# Patient Record
Sex: Female | Born: 1985 | Race: Black or African American | Hispanic: No | Marital: Single | Smoking: Current some day smoker
Health system: Southern US, Community
[De-identification: ages and names within clinical notes are randomized; demographics above are authoritative.]

## PROBLEM LIST (undated history)

## (undated) ENCOUNTER — Inpatient Hospital Stay (HOSPITAL_COMMUNITY): Payer: Self-pay

## (undated) DIAGNOSIS — F32A Depression, unspecified: Secondary | ICD-10-CM

## (undated) DIAGNOSIS — A6009 Herpesviral infection of other urogenital tract: Secondary | ICD-10-CM

## (undated) DIAGNOSIS — F329 Major depressive disorder, single episode, unspecified: Secondary | ICD-10-CM

## (undated) DIAGNOSIS — Z8619 Personal history of other infectious and parasitic diseases: Secondary | ICD-10-CM

## (undated) DIAGNOSIS — Q860 Fetal alcohol syndrome (dysmorphic): Secondary | ICD-10-CM

## (undated) DIAGNOSIS — G43909 Migraine, unspecified, not intractable, without status migrainosus: Secondary | ICD-10-CM

## (undated) HISTORY — PX: TYMPANOSTOMY TUBE PLACEMENT: SHX32

## (undated) HISTORY — PX: WISDOM TOOTH EXTRACTION: SHX21

## (undated) HISTORY — PX: APPENDECTOMY: SHX54

## (undated) HISTORY — DX: Depression, unspecified: F32.A

## (undated) HISTORY — PX: EYE SURGERY: SHX253

## (undated) HISTORY — DX: Major depressive disorder, single episode, unspecified: F32.9

## (undated) HISTORY — DX: Fetal alcohol syndrome (dysmorphic): Q86.0

## (undated) HISTORY — DX: Personal history of other infectious and parasitic diseases: Z86.19

---
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
## 2007-04-27 ENCOUNTER — Other Ambulatory Visit: Admission: RE | Admit: 2007-04-27 | Discharge: 2007-04-27 | Payer: Self-pay | Admitting: Family Medicine

## 2007-12-13 ENCOUNTER — Other Ambulatory Visit: Admission: RE | Admit: 2007-12-13 | Discharge: 2007-12-13 | Payer: Self-pay | Admitting: Family Medicine

## 2008-02-26 ENCOUNTER — Emergency Department (HOSPITAL_COMMUNITY): Admission: EM | Admit: 2008-02-26 | Discharge: 2008-02-27 | Payer: Self-pay | Admitting: Emergency Medicine

## 2008-10-26 ENCOUNTER — Inpatient Hospital Stay (HOSPITAL_COMMUNITY): Admission: AD | Admit: 2008-10-26 | Discharge: 2008-10-26 | Payer: Self-pay | Admitting: Obstetrics

## 2008-10-31 ENCOUNTER — Inpatient Hospital Stay (HOSPITAL_COMMUNITY): Admission: AD | Admit: 2008-10-31 | Discharge: 2008-10-31 | Payer: Self-pay | Admitting: Obstetrics

## 2008-10-31 ENCOUNTER — Ambulatory Visit: Payer: Self-pay | Admitting: Family Medicine

## 2008-11-04 ENCOUNTER — Ambulatory Visit (HOSPITAL_COMMUNITY): Admission: RE | Admit: 2008-11-04 | Discharge: 2008-11-04 | Payer: Self-pay | Admitting: Obstetrics

## 2008-12-11 ENCOUNTER — Ambulatory Visit (HOSPITAL_COMMUNITY): Admission: RE | Admit: 2008-12-11 | Discharge: 2008-12-11 | Payer: Self-pay | Admitting: Obstetrics

## 2009-02-05 ENCOUNTER — Ambulatory Visit: Payer: Self-pay | Admitting: Advanced Practice Midwife

## 2009-02-05 ENCOUNTER — Inpatient Hospital Stay (HOSPITAL_COMMUNITY): Admission: AD | Admit: 2009-02-05 | Discharge: 2009-02-06 | Payer: Self-pay | Admitting: Obstetrics

## 2009-02-15 ENCOUNTER — Inpatient Hospital Stay (HOSPITAL_COMMUNITY): Admission: AD | Admit: 2009-02-15 | Discharge: 2009-02-15 | Payer: Self-pay | Admitting: Obstetrics

## 2009-02-17 ENCOUNTER — Inpatient Hospital Stay (HOSPITAL_COMMUNITY): Admission: AD | Admit: 2009-02-17 | Discharge: 2009-02-18 | Payer: Self-pay | Admitting: Obstetrics

## 2009-02-20 ENCOUNTER — Inpatient Hospital Stay (HOSPITAL_COMMUNITY): Admission: AD | Admit: 2009-02-20 | Discharge: 2009-02-22 | Payer: Self-pay | Admitting: Obstetrics

## 2010-04-06 ENCOUNTER — Other Ambulatory Visit: Payer: Self-pay | Admitting: Family Medicine

## 2010-04-06 ENCOUNTER — Other Ambulatory Visit (HOSPITAL_COMMUNITY)
Admission: RE | Admit: 2010-04-06 | Discharge: 2010-04-06 | Disposition: A | Discharge status: Home / Self Care | Payer: Self-pay | Source: Ambulatory Visit | Attending: Family Medicine | Admitting: Family Medicine

## 2010-04-06 DIAGNOSIS — N760 Acute vaginitis: Secondary | ICD-10-CM | POA: Insufficient documentation

## 2010-04-06 DIAGNOSIS — Z01419 Encounter for gynecological examination (general) (routine) without abnormal findings: Secondary | ICD-10-CM | POA: Insufficient documentation

## 2010-04-06 DIAGNOSIS — Z113 Encounter for screening for infections with a predominantly sexual mode of transmission: Secondary | ICD-10-CM | POA: Insufficient documentation

## 2010-04-11 LAB — CBC
HCT: 37.7 % (ref 36.0–46.0)
HCT: 38.6 % (ref 36.0–46.0)
Hemoglobin: 12.5 g/dL (ref 12.0–15.0)
Hemoglobin: 12.8 g/dL (ref 12.0–15.0)
MCHC: 33 g/dL (ref 30.0–36.0)
MCHC: 33.3 g/dL (ref 30.0–36.0)
MCV: 92.3 fL (ref 78.0–100.0)
MCV: 93 fL (ref 78.0–100.0)
Platelets: 239 10*3/uL (ref 150–400)
Platelets: 249 10*3/uL (ref 150–400)
RBC: 4.08 MIL/uL (ref 3.87–5.11)
RBC: 4.15 MIL/uL (ref 3.87–5.11)
RDW: 13.1 % (ref 11.5–15.5)
RDW: 13.4 % (ref 11.5–15.5)
WBC: 12.9 10*3/uL — ABNORMAL HIGH (ref 4.0–10.5)
WBC: 17.7 10*3/uL — ABNORMAL HIGH (ref 4.0–10.5)

## 2010-04-11 LAB — URINALYSIS, ROUTINE W REFLEX MICROSCOPIC
Bilirubin Urine: NEGATIVE
Glucose, UA: NEGATIVE mg/dL
Ketones, ur: 15 mg/dL — AB
Nitrite: NEGATIVE
Protein, ur: NEGATIVE mg/dL
Specific Gravity, Urine: 1.025 (ref 1.005–1.030)
Urobilinogen, UA: 0.2 mg/dL (ref 0.0–1.0)
pH: 5.5 (ref 5.0–8.0)

## 2010-04-11 LAB — URINE MICROSCOPIC-ADD ON: RBC / HPF: NONE SEEN RBC/hpf (ref <3)

## 2010-04-11 LAB — RPR: RPR Ser Ql: NONREACTIVE

## 2010-04-28 LAB — RH IMMUNE GLOBULIN WORKUP (NOT WOMEN'S HOSP)
ABO/RH(D): O NEG
Antibody Screen: NEGATIVE

## 2010-04-29 LAB — COMPREHENSIVE METABOLIC PANEL
ALT: 23 U/L (ref 0–35)
AST: 28 U/L (ref 0–37)
Albumin: 3.1 g/dL — ABNORMAL LOW (ref 3.5–5.2)
Alkaline Phosphatase: 53 U/L (ref 39–117)
BUN: 6 mg/dL (ref 6–23)
CO2: 24 mEq/L (ref 19–32)
Calcium: 9 mg/dL (ref 8.4–10.5)
Chloride: 105 mEq/L (ref 96–112)
Creatinine, Ser: 0.6 mg/dL (ref 0.4–1.2)
GFR calc Af Amer: >60 mL/min (ref >60)
GFR calc non Af Amer: >60 mL/min (ref >60)
Glucose, Bld: 90 mg/dL (ref 70–99)
Potassium: 3.5 mEq/L (ref 3.5–5.1)
Sodium: 135 mEq/L (ref 135–145)
Total Bilirubin: 0.5 mg/dL (ref 0.3–1.2)
Total Protein: 6.4 g/dL (ref 6.0–8.3)

## 2010-04-29 LAB — URINALYSIS, ROUTINE W REFLEX MICROSCOPIC
Bilirubin Urine: NEGATIVE
Bilirubin Urine: NEGATIVE
Glucose, UA: 500 mg/dL — AB
Glucose, UA: NEGATIVE mg/dL
Hgb urine dipstick: NEGATIVE
Ketones, ur: 15 mg/dL — AB
Ketones, ur: NEGATIVE mg/dL
Nitrite: NEGATIVE
Nitrite: NEGATIVE
Protein, ur: 30 mg/dL — AB
Protein, ur: NEGATIVE mg/dL
Specific Gravity, Urine: 1.025 (ref 1.005–1.030)
Specific Gravity, Urine: <1.005 — ABNORMAL LOW (ref 1.005–1.030)
Urobilinogen, UA: 0.2 mg/dL (ref 0.0–1.0)
Urobilinogen, UA: 0.2 mg/dL (ref 0.0–1.0)
pH: 6 (ref 5.0–8.0)
pH: 7 (ref 5.0–8.0)

## 2010-04-29 LAB — URINE MICROSCOPIC-ADD ON

## 2010-04-29 LAB — CBC
HCT: 28.8 % — ABNORMAL LOW (ref 36.0–46.0)
Hemoglobin: 9.8 g/dL — ABNORMAL LOW (ref 12.0–15.0)
MCHC: 34.2 g/dL (ref 30.0–36.0)
MCV: 90.1 fL (ref 78.0–100.0)
Platelets: 281 10*3/uL (ref 150–400)
RBC: 3.19 MIL/uL — ABNORMAL LOW (ref 3.87–5.11)
RDW: 13.3 % (ref 11.5–15.5)
WBC: 13.4 10*3/uL — ABNORMAL HIGH (ref 4.0–10.5)

## 2010-04-29 LAB — URINE CULTURE: Colony Count: 55000

## 2010-05-11 LAB — POCT I-STAT, CHEM 8
BUN: 8 mg/dL (ref 6–23)
Calcium, Ion: 1.16 mmol/L (ref 1.12–1.32)
Chloride: 102 mEq/L (ref 96–112)
Creatinine, Ser: 1 mg/dL (ref 0.4–1.2)
Glucose, Bld: 98 mg/dL (ref 70–99)
HCT: 38 % (ref 36.0–46.0)
Hemoglobin: 12.9 g/dL (ref 12.0–15.0)
Potassium: 3.5 mEq/L (ref 3.5–5.1)
Sodium: 140 mEq/L (ref 135–145)
TCO2: 25 mmol/L (ref 0–100)

## 2010-05-11 LAB — POCT PREGNANCY, URINE: Preg Test, Ur: NEGATIVE

## 2010-06-11 NOTE — Op Note (Signed)
[name] [name]                        ACCOUNT NO.:  [id number]   MEDICAL RECORD NO.:  [id number]                   PATIENT TYPE:  INP   LOCATION:  6119                                 FACILITY:  [facility]   PHYSICIAN:  Prabhakar D. Pendse, M.D.           DATE OF BIRTH:  November 04, 1985   DATE OF PROCEDURE:  03/05/2002  DATE OF DISCHARGE:                                 OPERATIVE REPORT   PREOPERATIVE DIAGNOSIS:  Acute appendicitis.   POSTOPERATIVE DIAGNOSES:  1. Possible periappendicitis.  2. Congestion of the uterus and fallopian tubes without any obvious     infection by gross inspection.   OPERATION:  1. Laparoscopy.  2. Appendectomy.   SURGEON:  Prabhakar D. Levie Heritage, M.D.   ASSISTANT:  Leonia Corona, M.D.   ANESTHESIA:  Nurse.   INDICATIONS:  This 25 year old girl was seen  at [facility]  emergency room on the early morning of March 05, 2002, with a history of  progressively worse abdominal pain, intermittent bilious vomiting, possible  loose stools and low grade fever of about 24 hours duration. Her white count  was 18,100 with a shift to the left. A urinalysis was normal. A CT scan was  done which was nondiagnostic. In view of the localized tenderness in the  lower abdomen and bilious vomiting, the patient was admitted for  observation.   A repeat CBC was done at 12 noon on March 05, 2002, which showed  persistent leukocytosis in the form of 16,500 white count with a shift to  the left and the patient continued to have lower abdominal tenderness.  Hence, a laparoscopic examination and appendectomy was planned.   FINDINGS:  The laparoscopic evaluation of the pelvic cavity showed a small  quantity of free fluid in the pelvic cavity. The appendix was about 4 inches  long with evidence of periappendicitis. No definite obstruction or evidence  of suppurative appendicitis could be seen on gross examination. Examination  of the uterus and tubes showed  generalized congestion of the surfaces  without any gross evidence of purulent drainage. Appendectomy was planned.   DESCRIPTION OF PROCEDURE:  Under satisfactory general endotracheal  anesthesia and the patient in the supine position, the abdomen was  thoroughly prepped and draped in the usual sterile manner. A Foley catheter  was inserted.   The infraumbilical transverse incision was made and  carried through all the  layers of the abdominal wall. After going through the peritoneum, a Hasson 1-  cm port was placed in. The telescope was passed through that and evaluation  of the pelvic cavity was carried out which showed the findings as described  above.   Now a small incision was made in the right midquadrant and a  5-mm port was inserted through that while inspection was carried out from  inside the peritoneal cavity. Likewise another 12-mm port was placed in the  left lower quadrant area.   The  appendix was now grasped and held under traction from the fold on the  right mid quadrant. The dissector was passed through the left lower quadrant  port and the mesentery of the appendix was separated from the lower part of  the appendix and a nice window was made. The dissector was removed and a  vascular stapler was placed in through the 12-mm port and the mesentery was  clamped and cut in the usual fashion.   The appendicular base was now exposed and an appendectomy was done with  another reloaded vascular stapler. A satisfactory appendectomy was  accomplished. There was minimal bleeding which was cleared by irrigation and  aspiration. No further bleeding was noted.   The rest of the pelvic organs appeared unremarkable, and hemostasis being  satisfactory, the appendix was retrieved by bag and all the ports were  serially removed and defects were closed with 0 Vicryl uninterrupted and  interrupted sutures. The skin was approximated with 4-0 Monocryl and 5-0  Monocryl respectively.  Then 0.25% Marcaine with epinephrine was injected  locally for postoperative analgesia.   Throughout the procedure the patient's vital signs remained stable. The  patient withstood the procedure well and was transferred to the recovery  room in satisfactory general condition.                                               Prabhakar D. Levie Heritage, M.D.    PDP/MEDQ  D:  03/05/2002  T:  03/05/2002  Job:  [id number]   cc:   Carren Rang, M.D.  [address]  [place], [place] [address]  Fax: 1   [facility]
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
## 2010-06-25 ENCOUNTER — Inpatient Hospital Stay (HOSPITAL_COMMUNITY)
Admission: AD | Admit: 2010-06-25 | Payer: Medicaid Other | Source: Ambulatory Visit | Admitting: Obstetrics & Gynecology

## 2010-06-26 ENCOUNTER — Emergency Department (HOSPITAL_COMMUNITY)
Admission: EM | Admit: 2010-06-26 | Discharge: 2010-06-26 | Disposition: A | Discharge status: Home / Self Care | Payer: Medicaid Other | Attending: Emergency Medicine | Admitting: Emergency Medicine

## 2010-06-26 DIAGNOSIS — B86 Scabies: Secondary | ICD-10-CM | POA: Insufficient documentation

## 2010-06-26 DIAGNOSIS — R21 Rash and other nonspecific skin eruption: Secondary | ICD-10-CM | POA: Insufficient documentation

## 2010-06-26 DIAGNOSIS — F79 Unspecified intellectual disabilities: Secondary | ICD-10-CM | POA: Insufficient documentation

## 2010-06-26 DIAGNOSIS — K644 Residual hemorrhoidal skin tags: Secondary | ICD-10-CM | POA: Insufficient documentation

## 2010-06-26 DIAGNOSIS — K6289 Other specified diseases of anus and rectum: Secondary | ICD-10-CM | POA: Insufficient documentation

## 2010-10-25 LAB — OB RESULTS CONSOLE HEPATITIS B SURFACE ANTIGEN: Hepatitis B Surface Ag: NEGATIVE

## 2010-10-25 LAB — OB RESULTS CONSOLE GC/CHLAMYDIA: Chlamydia: NEGATIVE

## 2010-10-25 LAB — OB RESULTS CONSOLE RUBELLA ANTIBODY, IGM: Rubella: IMMUNE

## 2010-10-25 LAB — OB RESULTS CONSOLE RPR: RPR: NONREACTIVE

## 2010-10-25 LAB — OB RESULTS CONSOLE HIV ANTIBODY (ROUTINE TESTING): HIV: NONREACTIVE

## 2011-01-25 NOTE — L&D Delivery Note (Signed)
Delivery Note At 7:24 AM a viable female was delivered via Vaginal, Spontaneous Delivery (Presentation: ; Occiput Anterior).  APGAR: 8, 9; weight .   Placenta status: Intact, Spontaneous.  Cord:  with the following complications: None.  Cord pH: not done  Anesthesia:   Episiotomy: None Lacerations:  Suture Repair: 2.0 Est. Blood Loss (mL):   Mom to postpartum.  Baby to nursery-stable.  Dajha Urquilla A 06/02/2011, 7:32 AM

## 2011-02-23 ENCOUNTER — Encounter (HOSPITAL_COMMUNITY): Payer: Self-pay

## 2011-02-23 ENCOUNTER — Inpatient Hospital Stay (HOSPITAL_COMMUNITY)
Admission: AD | Admit: 2011-02-23 | Discharge: 2011-02-23 | Disposition: A | Discharge status: Home / Self Care | Payer: Medicare Other | Source: Ambulatory Visit | Attending: Obstetrics | Admitting: Obstetrics

## 2011-02-23 ENCOUNTER — Inpatient Hospital Stay (HOSPITAL_COMMUNITY): Payer: Medicare Other

## 2011-02-23 DIAGNOSIS — O99891 Other specified diseases and conditions complicating pregnancy: Secondary | ICD-10-CM | POA: Insufficient documentation

## 2011-02-23 DIAGNOSIS — J40 Bronchitis, not specified as acute or chronic: Secondary | ICD-10-CM | POA: Insufficient documentation

## 2011-02-23 LAB — CBC
HCT: 31 % — ABNORMAL LOW (ref 36.0–46.0)
Hemoglobin: 10.4 g/dL — ABNORMAL LOW (ref 12.0–15.0)
MCH: 29.3 pg (ref 26.0–34.0)
MCHC: 33.5 g/dL (ref 30.0–36.0)
MCV: 87.3 fL (ref 78.0–100.0)
Platelets: 280 10*3/uL (ref 150–400)
RBC: 3.55 MIL/uL — ABNORMAL LOW (ref 3.87–5.11)
RDW: 13.2 % (ref 11.5–15.5)
WBC: 8.9 10*3/uL (ref 4.0–10.5)

## 2011-02-23 LAB — URINALYSIS, ROUTINE W REFLEX MICROSCOPIC
Bilirubin Urine: NEGATIVE
Glucose, UA: NEGATIVE mg/dL
Hgb urine dipstick: NEGATIVE
Ketones, ur: 40 mg/dL — AB
Nitrite: NEGATIVE
Protein, ur: 100 mg/dL — AB
Specific Gravity, Urine: 1.025 (ref 1.005–1.030)
Urobilinogen, UA: 2 mg/dL — ABNORMAL HIGH (ref 0.0–1.0)
pH: 7 (ref 5.0–8.0)

## 2011-02-23 LAB — URINE MICROSCOPIC-ADD ON

## 2011-02-23 MED ORDER — AZITHROMYCIN 250 MG PO TABS: ORAL_TABLET | ORAL | Status: AC

## 2011-02-23 MED ORDER — BENZONATATE 100 MG PO CAPS: 100.0000 mg | ORAL_CAPSULE | Freq: Three times a day (TID) | ORAL | Status: AC

## 2011-02-23 NOTE — ED Provider Notes (Signed)
History   Pt presents today c/o a "bad cold." She states she has had sx for about 1wk including cough, congestion, and ear pain. She reports that Dr. Gaynell Face gave her some pills for her ears but she doesn't like the way they make her feel. Yesterday she began to have a slight sore throat. She denies fever or productive cough. She states she has some chest soreness as a result of coughing and she also has noticed some stress urinary incontinence with coughing.  Chief Complaint  Patient presents with  . URI   HPI  OB History    Grav Para Term Preterm Abortions TAB SAB Ect Mult Living   1               No past medical history on file.  No past surgical history on file.  No family history on file.  History  Substance Use Topics  . Smoking status: Not on file  . Smokeless tobacco: Not on file  . Alcohol Use: Not on file    Allergies: Allergies not on file  No prescriptions prior to admission    Review of Systems  Constitutional: Positive for malaise/fatigue. Negative for fever and chills.  HENT: Positive for ear pain, congestion and sore throat. Negative for hearing loss, nosebleeds, tinnitus and ear discharge.   Eyes: Negative for blurred vision and double vision.  Respiratory: Positive for cough. Negative for hemoptysis, sputum production, shortness of breath, wheezing and stridor.   Cardiovascular: Negative for chest pain, palpitations and orthopnea.  Gastrointestinal: Negative for nausea, vomiting, abdominal pain and diarrhea.  Genitourinary: Negative for dysuria, urgency and frequency.  Neurological: Positive for headaches. Negative for dizziness.  Psychiatric/Behavioral: Negative for depression and suicidal ideas.   Physical Exam   Blood pressure 104/63, pulse 100, temperature 98.7 F (37.1 C), temperature source Oral, resp. rate 16, height 5\' 1"  (1.549 m), weight 112 lb 9.6 oz (51.075 kg), SpO2 98.00%.  Physical Exam  Nursing note and vitals  reviewed. Constitutional: She is oriented to person, place, and time. She appears well-developed and well-nourished. No distress.  HENT:  Head: Normocephalic and atraumatic.  Right Ear: No drainage, swelling or tenderness. Tympanic membrane is injected and erythematous. Tympanic membrane is not retracted and not bulging. No decreased hearing is noted.  Left Ear: No drainage, swelling or tenderness. Tympanic membrane is injected and erythematous. Tympanic membrane is not retracted and not bulging. No decreased hearing is noted.  Mouth/Throat: Mucous membranes are normal. No oropharyngeal exudate, posterior oropharyngeal edema, posterior oropharyngeal erythema or tonsillar abscesses.  Eyes: EOM are normal. Pupils are equal, round, and reactive to light.  Cardiovascular: Normal rate, regular rhythm and normal heart sounds.  Exam reveals no gallop.   No murmur heard. Respiratory: Effort normal. No respiratory distress. She has no decreased breath sounds. She has no wheezes. She has rhonchi. She has no rales. She exhibits no tenderness.  GI: Soft. She exhibits no distension. There is no tenderness. There is no rebound and no guarding.  Neurological: She is alert and oriented to person, place, and time.  Skin: Skin is warm and dry. She is not diaphoretic.  Psychiatric: She has a normal mood and affect. Her behavior is normal. Judgment and thought content normal.    MAU Course  Procedures  Results for orders placed during the hospital encounter of 02/23/11 (from the past 24 hour(s))  URINALYSIS, ROUTINE W REFLEX MICROSCOPIC     Status: Abnormal   Collection Time   02/23/11  5:10  PM      Component Value Range   Color, Urine YELLOW  YELLOW    APPearance CLEAR  CLEAR    Specific Gravity, Urine 1.025  1.005 - 1.030    pH 7.0  5.0 - 8.0    Glucose, UA NEGATIVE  NEGATIVE (mg/dL)   Hgb urine dipstick NEGATIVE  NEGATIVE    Bilirubin Urine NEGATIVE  NEGATIVE    Ketones, ur 40 (*) NEGATIVE (mg/dL)    Protein, ur 161 (*) NEGATIVE (mg/dL)   Urobilinogen, UA 2.0 (*) 0.0 - 1.0 (mg/dL)   Nitrite NEGATIVE  NEGATIVE    Leukocytes, UA TRACE (*) NEGATIVE   URINE MICROSCOPIC-ADD ON     Status: Abnormal   Collection Time   02/23/11  5:10 PM      Component Value Range   Squamous Epithelial / LPF MANY (*) RARE    WBC, UA 0-2  <3 (WBC/hpf)   RBC / HPF 3-6  <3 (RBC/hpf)   Bacteria, UA MANY (*) RARE    Urine-Other MUCOUS PRESENT    CBC     Status: Abnormal   Collection Time   02/23/11  5:23 PM      Component Value Range   WBC 8.9  4.0 - 10.5 (K/uL)   RBC 3.55 (*) 3.87 - 5.11 (MIL/uL)   Hemoglobin 10.4 (*) 12.0 - 15.0 (g/dL)   HCT 09.6 (*) 04.5 - 46.0 (%)   MCV 87.3  78.0 - 100.0 (fL)   MCH 29.3  26.0 - 34.0 (pg)   MCHC 33.5  30.0 - 36.0 (g/dL)   RDW 40.9  81.1 - 91.4 (%)   Platelets 280  150 - 400 (K/uL)   Dg Chest 2 View  02/23/2011  *RADIOLOGY REPORT*  Clinical Data: Cough, [redacted] weeks pregnant  CHEST - 2 VIEW  Comparison: 10/31/2003  Findings: Upper normal heart size consistent with pregnancy. Mediastinal contours and pulmonary vascularity normal. Lungs clear. Very minimal chronic peribronchial thickening stable. No pleural effusion or pneumothorax. No acute osseous findings. Tiny bilateral cervical ribs again seen.  IMPRESSION: No acute abnormalities.  Original Report Authenticated By: Lollie Marrow, M.D.     Assessment and Plan  Bronchitis: discussed with pt at length. Will tx with a Z-pack and tessalon Perles. She will f/u with Dr. Gaynell Face. Discussed diet, activity, risks, and precautions.  Clinton Gallant. Cana Mignano III, DrHSc, MPAS, PA-C  02/23/2011, 5:07 PM   Henrietta Hoover, PA 02/23/11 7829

## 2011-02-23 NOTE — Progress Notes (Signed)
Patient states she has had a cold for over a week, has had cough, running nose and bilateral ear pain. Chest hurts with coughing and now having a sore throat. Denies any pregnancy problems. Reports feeling movement, no bleeding but does have a little leaking only with coughing.

## 2011-03-05 ENCOUNTER — Inpatient Hospital Stay (HOSPITAL_COMMUNITY)
Admission: AD | Admit: 2011-03-05 | Discharge: 2011-03-05 | Disposition: A | Discharge status: Home / Self Care | Payer: Medicare Other | Source: Ambulatory Visit | Attending: Obstetrics | Admitting: Obstetrics

## 2011-03-05 ENCOUNTER — Encounter (HOSPITAL_COMMUNITY): Payer: Self-pay | Admitting: *Deleted

## 2011-03-05 DIAGNOSIS — O228X9 Other venous complications in pregnancy, unspecified trimester: Secondary | ICD-10-CM | POA: Insufficient documentation

## 2011-03-05 DIAGNOSIS — K649 Unspecified hemorrhoids: Secondary | ICD-10-CM | POA: Insufficient documentation

## 2011-03-05 MED ORDER — DOCUSATE SODIUM 100 MG PO CAPS: 100.0000 mg | ORAL_CAPSULE | Freq: Two times a day (BID) | ORAL | Status: AC

## 2011-03-05 MED ORDER — HYDROCORTISONE 2.5 % RE CREA: TOPICAL_CREAM | Freq: Two times a day (BID) | RECTAL | Status: AC

## 2011-03-05 NOTE — Progress Notes (Signed)
Patient c/o swelling at rectum since Sunday 27 weeks

## 2011-03-05 NOTE — Discharge Instructions (Signed)

## 2011-03-05 NOTE — ED Provider Notes (Signed)
History   Pt presents today c/o possible hemorrhoid. She states for the past 3 days she has had a lump and itching in her anus and pain with BM. She denies bleeding, abd pain, vag dc, vag bleeding, or any other sx at this time.  Chief Complaint  Patient presents with  . Hemorrhoids   HPI  OB History    Grav Para Term Preterm Abortions TAB SAB Ect Mult Living   2 1 1  0 0 0 0 0 0 1      History reviewed. No pertinent past medical history.  Past Surgical History  Procedure Date  . Appendectomy     No family history on file.  History  Substance Use Topics  . Smoking status: Never Smoker   . Smokeless tobacco: Never Used  . Alcohol Use: No    Allergies:  Allergies  Allergen Reactions  . Ibuprofen Swelling    Prescriptions prior to admission  Medication Sig Dispense Refill  . azithromycin (ZITHROMAX) 250 MG tablet Take 250 mg by mouth daily.      Marland Kitchen DISCONTD: Phenylephrine-DM-GG (TUSSIN CF PO) Take 5 mLs by mouth daily as needed. Patient used this medication for a cold.        Review of Systems  Constitutional: Negative for fever and chills.  Eyes: Negative for blurred vision.  Respiratory: Negative for cough, hemoptysis, sputum production, shortness of breath and wheezing.   Cardiovascular: Negative for chest pain and palpitations.  Gastrointestinal: Negative for nausea, vomiting, abdominal pain and diarrhea.  Genitourinary: Negative for dysuria, urgency, frequency and hematuria.  Neurological: Negative for dizziness and headaches.  Psychiatric/Behavioral: Negative for depression and suicidal ideas.   Physical Exam   Blood pressure 104/60, pulse 102, temperature 97.5 F (36.4 C), temperature source Oral, resp. rate 16, height 5' 0.5" (1.537 m), weight 117 lb (53.071 kg).  Physical Exam  Nursing note and vitals reviewed. Constitutional: She is oriented to person, place, and time. She appears well-developed and well-nourished. No distress.  HENT:  Head:  Normocephalic and atraumatic.  Eyes: EOM are normal. Pupils are equal, round, and reactive to light.  GI: Soft. She exhibits no distension and no mass. There is no tenderness. There is no rebound and no guarding.  Genitourinary: Rectal exam shows external hemorrhoid (Very small external hemorrhoid noted on exam.). No vaginal discharge found.  Neurological: She is alert and oriented to person, place, and time.  Skin: Skin is warm and dry. She is not diaphoretic.  Psychiatric: She has a normal mood and affect. Her behavior is normal. Judgment and thought content normal.    MAU Course  Procedures  NST reactive for 26wks.  Assessment and Plan  Hemorrhoid: discussed with pt at length. Will give Rx for Anusol HC cream and colace. She has f/u scheduled with Dr. Gaynell Face. Discussed diet, activity, risks, and precautions.  Clinton Gallant. Jilda Kress III, DrHSc, MPAS, PA-C  03/05/2011, 10:44 AM   Henrietta Hoover, PA 03/05/11 1048

## 2011-03-28 ENCOUNTER — Other Ambulatory Visit: Payer: Self-pay | Admitting: Obstetrics

## 2011-03-28 ENCOUNTER — Inpatient Hospital Stay (HOSPITAL_COMMUNITY)
Admission: AD | Admit: 2011-03-28 | Discharge: 2011-03-28 | Disposition: A | Discharge status: Home / Self Care | Payer: Medicare Other | Source: Ambulatory Visit | Attending: Obstetrics | Admitting: Obstetrics

## 2011-03-28 DIAGNOSIS — Z3480 Encounter for supervision of other normal pregnancy, unspecified trimester: Secondary | ICD-10-CM | POA: Insufficient documentation

## 2011-03-28 DIAGNOSIS — Z2989 Encounter for other specified prophylactic measures: Secondary | ICD-10-CM | POA: Insufficient documentation

## 2011-03-28 DIAGNOSIS — Z298 Encounter for other specified prophylactic measures: Secondary | ICD-10-CM | POA: Insufficient documentation

## 2011-03-28 MED ORDER — RHO D IMMUNE GLOBULIN 1500 UNIT/2ML IJ SOLN
300.0000 ug | Freq: Once | INTRAMUSCULAR | Status: AC
  Administered 2011-03-28: 300 ug via INTRAMUSCULAR

## 2011-03-29 LAB — RH IG WORKUP (INCLUDES ABO/RH)
Antibody Screen: NEGATIVE
Gestational Age(Wks): 30

## 2011-04-01 MED ORDER — RHO D IMMUNE GLOBULIN 1500 UNIT/2ML IJ SOLN: 300.0000 ug | Freq: Once | INTRAMUSCULAR | Status: AC

## 2011-05-29 ENCOUNTER — Encounter (HOSPITAL_COMMUNITY): Payer: Self-pay | Admitting: *Deleted

## 2011-05-29 ENCOUNTER — Inpatient Hospital Stay (HOSPITAL_COMMUNITY)
Admission: AD | Admit: 2011-05-29 | Discharge: 2011-05-29 | Disposition: A | Discharge status: Home / Self Care | Payer: Medicare Other | Source: Ambulatory Visit | Attending: Obstetrics | Admitting: Obstetrics

## 2011-05-29 DIAGNOSIS — O479 False labor, unspecified: Secondary | ICD-10-CM | POA: Insufficient documentation

## 2011-05-29 NOTE — MAU Note (Signed)
Pt present to mau for labor eval.

## 2011-05-29 NOTE — Discharge Instructions (Signed)
Return to MAU with worsening symptoms.  Call your doctor with any concerns or questions.   

## 2011-05-29 NOTE — Progress Notes (Signed)
Dr. Gaynell Face notified of pt presenting for labor check.  Notified of VE and ctx pattern.  Orders received to discharge pt home.

## 2011-05-31 ENCOUNTER — Encounter (HOSPITAL_COMMUNITY): Payer: Self-pay | Admitting: *Deleted

## 2011-05-31 ENCOUNTER — Telehealth (HOSPITAL_COMMUNITY): Payer: Self-pay | Admitting: *Deleted

## 2011-05-31 NOTE — Telephone Encounter (Signed)
Preadmission screen  

## 2011-06-02 ENCOUNTER — Inpatient Hospital Stay (HOSPITAL_COMMUNITY)
Admission: AD | Admit: 2011-06-02 | Discharge: 2011-06-04 | DRG: 775 | Disposition: A | Discharge status: Home / Self Care | Payer: Medicare Other | Source: Ambulatory Visit | Attending: Obstetrics | Admitting: Obstetrics

## 2011-06-02 ENCOUNTER — Encounter (HOSPITAL_COMMUNITY): Payer: Self-pay | Admitting: *Deleted

## 2011-06-02 LAB — CBC
HCT: 32.9 % — ABNORMAL LOW (ref 36.0–46.0)
Hemoglobin: 10.7 g/dL — ABNORMAL LOW (ref 12.0–15.0)
MCHC: 32.5 g/dL (ref 30.0–36.0)
RBC: 3.79 MIL/uL — ABNORMAL LOW (ref 3.87–5.11)
WBC: 10.3 10*3/uL (ref 4.0–10.5)

## 2011-06-02 LAB — RPR: RPR Ser Ql: NONREACTIVE

## 2011-06-02 MED ORDER — WITCH HAZEL-GLYCERIN EX PADS: 1.0000 "application " | MEDICATED_PAD | CUTANEOUS | Status: DC | PRN

## 2011-06-02 MED ORDER — PENICILLIN G POTASSIUM 5000000 UNITS IJ SOLR
2.5000 10*6.[IU] | INTRAVENOUS | Status: DC
  Administered 2011-06-02: 2.5 10*6.[IU] via INTRAVENOUS
  Filled 2011-06-02 (×3): qty 2.5

## 2011-06-02 MED ORDER — OXYCODONE-ACETAMINOPHEN 5-325 MG PO TABS: 1.0000 | ORAL_TABLET | ORAL | Status: DC | PRN

## 2011-06-02 MED ORDER — LANOLIN HYDROUS EX OINT: TOPICAL_OINTMENT | CUTANEOUS | Status: DC | PRN

## 2011-06-02 MED ORDER — SIMETHICONE 80 MG PO CHEW: 80.0000 mg | CHEWABLE_TABLET | ORAL | Status: DC | PRN

## 2011-06-02 MED ORDER — ONDANSETRON HCL 4 MG PO TABS: 4.0000 mg | ORAL_TABLET | ORAL | Status: DC | PRN

## 2011-06-02 MED ORDER — FLEET ENEMA 7-19 GM/118ML RE ENEM: 1.0000 | ENEMA | RECTAL | Status: DC | PRN

## 2011-06-02 MED ORDER — BUTORPHANOL TARTRATE 2 MG/ML IJ SOLN
1.0000 mg | INTRAMUSCULAR | Status: DC | PRN
  Administered 2011-06-02 (×2): 1 mg via INTRAVENOUS
  Filled 2011-06-02 (×2): qty 1

## 2011-06-02 MED ORDER — TETANUS-DIPHTH-ACELL PERTUSSIS 5-2.5-18.5 LF-MCG/0.5 IM SUSP
0.5000 mL | Freq: Once | INTRAMUSCULAR | Status: AC
  Administered 2011-06-03: 0.5 mL via INTRAMUSCULAR
  Filled 2011-06-02: qty 0.5

## 2011-06-02 MED ORDER — SENNOSIDES-DOCUSATE SODIUM 8.6-50 MG PO TABS
2.0000 | ORAL_TABLET | Freq: Every day | ORAL | Status: DC
  Administered 2011-06-02 – 2011-06-03 (×2): 2 via ORAL

## 2011-06-02 MED ORDER — ONDANSETRON HCL 4 MG/2ML IJ SOLN: 4.0000 mg | INTRAMUSCULAR | Status: DC | PRN

## 2011-06-02 MED ORDER — ONDANSETRON HCL 4 MG/2ML IJ SOLN
4.0000 mg | Freq: Four times a day (QID) | INTRAMUSCULAR | Status: DC | PRN
  Administered 2011-06-02: 4 mg via INTRAVENOUS
  Filled 2011-06-02: qty 2

## 2011-06-02 MED ORDER — DIPHENHYDRAMINE HCL 25 MG PO CAPS: 25.0000 mg | ORAL_CAPSULE | Freq: Four times a day (QID) | ORAL | Status: DC | PRN

## 2011-06-02 MED ORDER — ACETAMINOPHEN 325 MG PO TABS
650.0000 mg | ORAL_TABLET | ORAL | Status: DC | PRN
  Administered 2011-06-03: 650 mg via ORAL
  Filled 2011-06-02: qty 2

## 2011-06-02 MED ORDER — LACTATED RINGERS IV SOLN
INTRAVENOUS | Status: DC
  Administered 2011-06-02: 03:00:00 via INTRAVENOUS

## 2011-06-02 MED ORDER — PRENATAL MULTIVITAMIN CH
1.0000 | ORAL_TABLET | Freq: Every day | ORAL | Status: DC
  Administered 2011-06-02 – 2011-06-04 (×3): 1 via ORAL
  Filled 2011-06-02 (×3): qty 1

## 2011-06-02 MED ORDER — OXYCODONE-ACETAMINOPHEN 5-325 MG PO TABS
1.0000 | ORAL_TABLET | ORAL | Status: DC | PRN
Start: 2011-06-02 — End: 2011-06-04
  Administered 2011-06-02 – 2011-06-03 (×5): 1 via ORAL
  Administered 2011-06-04: 2 via ORAL
  Filled 2011-06-02 (×7): qty 1

## 2011-06-02 MED ORDER — LACTATED RINGERS IV SOLN: 500.0000 mL | INTRAVENOUS | Status: DC | PRN

## 2011-06-02 MED ORDER — CITRIC ACID-SODIUM CITRATE 334-500 MG/5ML PO SOLN: 30.0000 mL | ORAL | Status: DC | PRN

## 2011-06-02 MED ORDER — ZOLPIDEM TARTRATE 5 MG PO TABS: 5.0000 mg | ORAL_TABLET | Freq: Every evening | ORAL | Status: DC | PRN

## 2011-06-02 MED ORDER — LIDOCAINE HCL (PF) 1 % IJ SOLN
30.0000 mL | INTRAMUSCULAR | Status: DC | PRN
  Filled 2011-06-02: qty 30

## 2011-06-02 MED ORDER — FERROUS SULFATE 325 (65 FE) MG PO TABS
325.0000 mg | ORAL_TABLET | Freq: Two times a day (BID) | ORAL | Status: DC
  Administered 2011-06-02 – 2011-06-03 (×2): 325 mg via ORAL
  Filled 2011-06-02 (×4): qty 1

## 2011-06-02 MED ORDER — IBUPROFEN 600 MG PO TABS: 600.0000 mg | ORAL_TABLET | Freq: Four times a day (QID) | ORAL | Status: DC

## 2011-06-02 MED ORDER — DEXTROSE 5 % IV SOLN
5.0000 10*6.[IU] | Freq: Once | INTRAVENOUS | Status: AC
  Administered 2011-06-02: 5 10*6.[IU] via INTRAVENOUS
  Filled 2011-06-02: qty 5

## 2011-06-02 MED ORDER — BENZOCAINE-MENTHOL 20-0.5 % EX AERO
1.0000 "application " | INHALATION_SPRAY | CUTANEOUS | Status: DC | PRN
  Filled 2011-06-02: qty 56

## 2011-06-02 MED ORDER — NALOXONE HCL 0.4 MG/ML IJ SOLN
INTRAMUSCULAR | Status: AC
  Filled 2011-06-02: qty 1

## 2011-06-02 MED ORDER — OXYTOCIN 20 UNITS IN LACTATED RINGERS INFUSION - SIMPLE: 125.0000 mL/h | Freq: Once | INTRAVENOUS | Status: DC

## 2011-06-02 MED ORDER — DIBUCAINE 1 % RE OINT
1.0000 "application " | TOPICAL_OINTMENT | RECTAL | Status: DC | PRN
  Filled 2011-06-02: qty 28

## 2011-06-02 MED ORDER — OXYTOCIN BOLUS FROM INFUSION
500.0000 mL | Freq: Once | INTRAVENOUS | Status: DC
  Filled 2011-06-02: qty 1000
  Filled 2011-06-02: qty 500

## 2011-06-02 NOTE — H&P (Signed)
This is Dr. Francoise Ceo dictating the history and physical on  [name] [name] she's a 26 year old gravida 2 para 1001 at 75 weeks and some days her EDC is 06/05/2011 she was admitted with ruptured membranes occurred at 4:30 AM today fluid clear contracting every 2 minutes and on examination the cervix is 2 cm 90% with the vertex at 0 station she was having some bearable D cells and an IUPC was inserted and amnioinfusion began her GBS was negative Past medical history patient had fetal alcohol syndrome Past surgical history negative Family history negative System review noncontributory Physical exam well-developed female in no distress HEENT negative Breasts negative Lungs clear to P&A Heart regular rhythm no murmurs no gallops Abdomen term Pelvic as described above Extremities negative and

## 2011-06-02 NOTE — MAU Note (Signed)
Pt reports contractions and leaking fluid since 2300. 

## 2011-06-02 NOTE — Progress Notes (Signed)
Provider aware of fhr tracing, pt requesting pain medication, Dr. Gaynell Face said to give her stadol

## 2011-06-03 LAB — CBC
Hemoglobin: 10.5 g/dL — ABNORMAL LOW (ref 12.0–15.0)
MCH: 28.1 pg (ref 26.0–34.0)
MCHC: 32.2 g/dL (ref 30.0–36.0)
Platelets: 270 10*3/uL (ref 150–400)
RDW: 13.6 % (ref 11.5–15.5)

## 2011-06-03 MED ORDER — RHO D IMMUNE GLOBULIN 1500 UNIT/2ML IJ SOLN
300.0000 ug | Freq: Once | INTRAMUSCULAR | Status: AC
  Administered 2011-06-03: 300 ug via INTRAMUSCULAR
  Filled 2011-06-03: qty 2

## 2011-06-03 NOTE — Progress Notes (Signed)
UR chart review completed.  

## 2011-06-03 NOTE — Progress Notes (Signed)
Patient ID: [name] [name], female   DOB: 06-10-1985, 26 y.o.   MRN: [id number] Postpartum day one Vital signs normal Fundus firm Lochia moderate Legs negative No complaints
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
## 2011-06-04 LAB — RH IG WORKUP (INCLUDES ABO/RH)
ABO/RH(D): O NEG
Gestational Age(Wks): 39

## 2011-06-04 MED ORDER — NORETHINDRONE 0.35 MG PO TABS: 1.0000 | ORAL_TABLET | Freq: Every day | ORAL | Status: DC

## 2011-06-04 MED ORDER — OXYCODONE-ACETAMINOPHEN 5-325 MG PO TABS: 1.0000 | ORAL_TABLET | Freq: Four times a day (QID) | ORAL | Status: AC | PRN

## 2011-06-04 MED ORDER — PRENATAL MULTIVITAMIN CH: 1.0000 | ORAL_TABLET | Freq: Every day | ORAL | Status: DC

## 2011-06-04 NOTE — Discharge Instructions (Signed)

## 2011-06-04 NOTE — Discharge Summary (Signed)
Obstetric Discharge Summary Reason for Admission: onset of labor Prenatal Procedures: none Intrapartum Procedures: spontaneous vaginal delivery Postpartum Procedures: none Complications-Operative and Postpartum: none Hemoglobin  Date Value Range Status  06/03/2011 10.5* 12.0-15.0 (g/dL) Final     HCT  Date Value Range Status  06/03/2011 32.6* 36.0-46.0 (%) Final    Physical Exam:  General: alert Lochia: appropriate Uterine Fundus: firm Incision: n/[name] DVT Evaluation: No evidence of DVT seen on physical exam.  Discharge Diagnoses: Term Pregnancy-delivered  Discharge Information: Date: 06/04/2011 Activity: pelvic rest Diet: routine Medications: PNV, Ibuprofen, Percocet and Micronor Condition: stable Instructions: see above Discharge to: home Follow-up Information    Follow up with EmilyBERNARD A, MD. Schedule an appointment as soon as possible for [name] visit in 6 weeks. (Call to schedule cirmcumcision)    Contact information:   [address] [place] [address] [phone]          Newborn Data: Live born female  Birth Weight: 6 lb 1.9 oz (2775 g) APGAR: 8, 9  Home with mother.  [name],[name] Krisko [name] 06/04/2011, 9:56 AM

## 2012-06-19 LAB — OB RESULTS CONSOLE GBS: GBS: NEGATIVE

## 2012-08-30 ENCOUNTER — Inpatient Hospital Stay (HOSPITAL_COMMUNITY)
Admission: AD | Admit: 2012-08-30 | Discharge: 2012-08-30 | Disposition: A | Discharge status: Home / Self Care | Payer: Medicare Other | Source: Ambulatory Visit | Attending: Obstetrics | Admitting: Obstetrics

## 2012-08-30 ENCOUNTER — Encounter (HOSPITAL_COMMUNITY): Payer: Self-pay

## 2012-08-30 ENCOUNTER — Inpatient Hospital Stay (HOSPITAL_COMMUNITY): Payer: Medicare Other

## 2012-08-30 DIAGNOSIS — O479 False labor, unspecified: Secondary | ICD-10-CM | POA: Insufficient documentation

## 2012-08-30 DIAGNOSIS — O4103X1 Oligohydramnios, third trimester, fetus 1: Secondary | ICD-10-CM

## 2012-08-30 DIAGNOSIS — O368131 Decreased fetal movements, third trimester, fetus 1: Secondary | ICD-10-CM

## 2012-08-30 NOTE — MAU Note (Signed)
Contractions since 3 am this morning with bloody show. Denies leaking of fluid. Positive fetal movement. Was dilated 1 cm in office on Tuesday.

## 2012-08-31 ENCOUNTER — Inpatient Hospital Stay (HOSPITAL_COMMUNITY)
Admission: AD | Admit: 2012-08-31 | Discharge: 2012-09-01 | DRG: 775 | Disposition: A | Discharge status: Home / Self Care | Payer: Medicare Other | Source: Ambulatory Visit | Attending: Obstetrics | Admitting: Obstetrics

## 2012-08-31 ENCOUNTER — Encounter (HOSPITAL_COMMUNITY): Payer: Self-pay | Admitting: Obstetrics and Gynecology

## 2012-08-31 DIAGNOSIS — O479 False labor, unspecified: Secondary | ICD-10-CM

## 2012-08-31 LAB — RPR: RPR Ser Ql: NONREACTIVE

## 2012-08-31 LAB — CBC
HCT: 30.6 % — ABNORMAL LOW (ref 36.0–46.0)
Hemoglobin: 10.3 g/dL — ABNORMAL LOW (ref 12.0–15.0)
MCH: 29.3 pg (ref 26.0–34.0)
MCHC: 33.7 g/dL (ref 30.0–36.0)
MCV: 86.9 fL (ref 78.0–100.0)
RDW: 13.7 % (ref 11.5–15.5)

## 2012-08-31 MED ORDER — FERROUS SULFATE 325 (65 FE) MG PO TABS
325.0000 mg | ORAL_TABLET | Freq: Two times a day (BID) | ORAL | Status: DC
  Administered 2012-08-31 – 2012-09-01 (×2): 325 mg via ORAL
  Filled 2012-08-31 (×2): qty 1

## 2012-08-31 MED ORDER — BENZOCAINE-MENTHOL 20-0.5 % EX AERO: 1.0000 "application " | INHALATION_SPRAY | CUTANEOUS | Status: DC | PRN

## 2012-08-31 MED ORDER — ERYTHROMYCIN 5 MG/GM OP OINT
TOPICAL_OINTMENT | Freq: Once | OPHTHALMIC | Status: AC
  Administered 2012-08-31: 1 via OPHTHALMIC

## 2012-08-31 MED ORDER — DIPHENHYDRAMINE HCL 25 MG PO CAPS: 25.0000 mg | ORAL_CAPSULE | Freq: Four times a day (QID) | ORAL | Status: DC | PRN

## 2012-08-31 MED ORDER — PRENATAL MULTIVITAMIN CH
1.0000 | ORAL_TABLET | Freq: Every day | ORAL | Status: DC
  Administered 2012-09-01: 1 via ORAL
  Filled 2012-08-31: qty 1

## 2012-08-31 MED ORDER — ONDANSETRON HCL 4 MG/2ML IJ SOLN: 4.0000 mg | INTRAMUSCULAR | Status: DC | PRN

## 2012-08-31 MED ORDER — ONDANSETRON HCL 4 MG PO TABS
4.0000 mg | ORAL_TABLET | ORAL | Status: DC | PRN
  Administered 2012-08-31: 4 mg via ORAL
  Filled 2012-08-31: qty 1

## 2012-08-31 MED ORDER — WITCH HAZEL-GLYCERIN EX PADS: 1.0000 "application " | MEDICATED_PAD | CUTANEOUS | Status: DC | PRN

## 2012-08-31 MED ORDER — LANOLIN HYDROUS EX OINT: 1.0000 "application " | TOPICAL_OINTMENT | CUTANEOUS | Status: DC | PRN

## 2012-08-31 MED ORDER — SENNOSIDES-DOCUSATE SODIUM 8.6-50 MG PO TABS
2.0000 | ORAL_TABLET | Freq: Every day | ORAL | Status: DC
  Administered 2012-08-31: 2 via ORAL

## 2012-08-31 MED ORDER — MEASLES, MUMPS & RUBELLA VAC ~~LOC~~ INJ
0.5000 mL | INJECTION | Freq: Once | SUBCUTANEOUS | Status: DC
  Filled 2012-08-31: qty 0.5

## 2012-08-31 MED ORDER — DIBUCAINE 1 % RE OINT: 1.0000 "application " | TOPICAL_OINTMENT | RECTAL | Status: DC | PRN

## 2012-08-31 MED ORDER — ZOLPIDEM TARTRATE 5 MG PO TABS: 5.0000 mg | ORAL_TABLET | Freq: Every evening | ORAL | Status: DC | PRN

## 2012-08-31 MED ORDER — MAGNESIUM HYDROXIDE 400 MG/5ML PO SUSP: 30.0000 mL | ORAL | Status: DC | PRN

## 2012-08-31 MED ORDER — SIMETHICONE 80 MG PO CHEW: 80.0000 mg | CHEWABLE_TABLET | ORAL | Status: DC | PRN

## 2012-08-31 MED ORDER — OXYCODONE-ACETAMINOPHEN 5-325 MG PO TABS
1.0000 | ORAL_TABLET | ORAL | Status: DC | PRN
  Administered 2012-08-31 (×2): 2 via ORAL
  Administered 2012-08-31: 1 via ORAL
  Administered 2012-09-01: 2 via ORAL
  Filled 2012-08-31 (×2): qty 2
  Filled 2012-08-31 (×2): qty 1

## 2012-08-31 MED ORDER — ONDANSETRON 8 MG PO TBDP
8.0000 mg | ORAL_TABLET | Freq: Once | ORAL | Status: AC
  Administered 2012-08-31: 8 mg via ORAL
  Filled 2012-08-31: qty 1

## 2012-08-31 MED ORDER — ERYTHROMYCIN 5 MG/GM OP OINT
TOPICAL_OINTMENT | OPHTHALMIC | Status: AC
  Administered 2012-08-31: 1 via OPHTHALMIC
  Filled 2012-08-31: qty 1

## 2012-08-31 MED ORDER — TETANUS-DIPHTH-ACELL PERTUSSIS 5-2.5-18.5 LF-MCG/0.5 IM SUSP
0.5000 mL | Freq: Once | INTRAMUSCULAR | Status: AC
  Administered 2012-09-01: 0.5 mL via INTRAMUSCULAR
  Filled 2012-08-31: qty 0.5

## 2012-08-31 NOTE — Lactation Note (Signed)
This note was copied from the chart of [name] Amariz Flamenco. Lactation Consultation Note Initial consultation with this experienced mother; baby was delivered at home early this morning and transported via EMS to [facility] with no distress.  Mom states baby latches well and had a few good feeds this morning, but has been asleep since. Baby now getting her bath, awake and alert, calm. Assisted mom to place baby STS and attempted to latch baby. However, baby was alert and looking around the room, uninterested in feeding at this time. Enc mom to continue frequent STS and cue based feeding, to attempt again in 30 min to one hour, or sooner if baby shows cues.  Reviewed br feeding basics, Baby and Me book, answered questions.  Enc mom to call lactation office if she has any concerns, and to attend the BFSG.  Patient Name: [name] [name] [id number] Date: 08/31/2012 Reason for consult: Initial assessment   Maternal Data Formula Feeding for Exclusion: No Infant to breast within first hour of birth:  (baby born at home) Has patient been taught Hand Expression?: Yes Does the patient have breastfeeding experience prior to this delivery?: Yes  Feeding Feeding Type: Breast Milk  LATCH Score/Interventions                      Lactation Tools Discussed/Used     Consult Status Consult Status: PRN Follow-up type: In-patient    Octavio Manns [facility] 08/31/2012, 3:41 PM

## 2012-08-31 NOTE — Clinical Social Work Maternal (Signed)
Clinical Social Work Department PSYCHOSOCIAL ASSESSMENT - MATERNAL/CHILD 08/31/2012  Patient:  [name] [name], WALPOLE  Account Number:  [id number]  Admit Date:  08/31/2012  Marjo Bicker Name:   Suzette Battiest    Clinical Social Worker:  Nobie Putnam, LCSW   Date/Time:  08/31/2012 01:21 PM  Date Referred:  08/31/2012   Referral source  CN  CN     Referred reason  Other - See comment   Other referral source:    I:  FAMILY / HOME ENVIRONMENT Child's legal guardian:  PARENT  Guardian - Name Guardian - Age Guardian - Address  Maycie Luera [address] [place], [place] [address]  Earney Navy 36 (same as above)   Other household support members/support persons Name Relationship DOB  Volney Presser. [place] 2013  Georgeanna Lea 2011   Other support:    II  PSYCHOSOCIAL DATA Information Source:  Patient Interview  Event organiser Employment:   Financial resources:  [employer] If Medicaid - County:  [place] Other  Food Stamps  WIC   School / Grade:   Maternity Care Coordinator / Child Services Coordination / Early Interventions:  Cultural issues impacting care:    III  STRENGTHS Strengths  Adequate Resources  Home prepared for Child (including basic supplies)  Supportive family/friends   Strength comment:    IV  RISK FACTORS AND CURRENT PROBLEMS Current Problem:  YES   Risk Factor & Current Problem Patient Issue Family Issue Risk Factor / Current Problem Comment  Mental Illness Y N Hx of PP depression    V  SOCIAL WORK ASSESSMENT CSW met with pt to assess her current social situation & offer resources as needed.  Pt lives with FOB & their children.  The pt has fetal alcohol syndrome versus one of her children, as reported to CSW.  The pt is high functioning & was able to answer all this CSW questions appropriately, when simplified.  CSW met with the family last year after pt delivered to assess PP depression history.  Pt & FOB were very friendly &  pleasant.  They described their experience of delivering at home & are happy that the baby is okay.  Since pt experienced PP depression after her daughter in 2011, CSW discussed signs & symptoms with the couple.  The pt did not experience PP depression after her son last year but admits to some on going depressed moods.  She identified her mother as the primary stressor, as she described her as a "drama queen." Pt's mother continues to drink alcohol & wants everything "her way," per pt.  Pt does not like conflict so she tries to avoid engaging any verbal altercations with her.  FOB will speak up to pt's mother but denies any physical contact.  Pt expressed interest in speaking with a therapist.  FOB thinks pt would benefits from services.  Pt is not interested in medication management.  She denies any SI.  CSW will make a referral to a counselor & provide pt with information.  The couple has all the necessary supplies for the infant.  Both parents appear to be appropriate at this time.  CSW will provide FOB with meal vouchers, since he does not have money to purchase food while here.  CSW will continue to assist family as needed until discharged.      VI SOCIAL WORK PLAN Social Work Plan  No Further Intervention Required / No Barriers to Discharge   Type of pt/family education:  If child protective services report - county:   If child protective services report - date:   Information/referral to community resources comment:   Other social work plan:      

## 2012-08-31 NOTE — MAU Provider Note (Signed)
Chief Complaint: No chief complaint on file.  First Provider Initiated Contact with Patient 08/31/12 [phone]     SUBJECTIVE HPI: [name] [name] is a 27 y.o. G3P2002 at [redacted]w[redacted]d by LMP who presents by EMS to MAU with precipitous delivery at home. Labor started at 0200. Placenta delivered en route to hospital. APGARS per EMS. Infant vigorous w/ normal color upon arrival.    Past Medical History  Diagnosis Date  . History of chlamydia   . Depression     post partum  . 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 Appendectomy     History   Social History  . Marital Status: Single    Spouse Name: N/A    Number of Children: N/A  . Years of Education: N/A   Occupational History  . Not on file.   Social History Main Topics  . Smoking status: Never Smoker   . Smokeless tobacco: Never Used  . Alcohol Use: No  . Drug Use: No  . Sexually Active: Yes   Other Topics Concern  . Not on file   Social History Narrative  . No narrative on file   Current Facility-Administered Medications on File Prior to Encounter  Medication Dose Route Frequency Provider Last Rate Last Dose  . rho(d) immune globulin (RHIG/Rhophylac) injection 300 mcg  300 mcg Intramuscular Once Kathreen Cosier, MD       Current Outpatient Prescriptions on File Prior to Encounter  Medication Sig Dispense Refill  . Prenatal Vit-Fe Fumarate-FA (PRENATAL MULTIVITAMIN) TABS Take 1 tablet by mouth daily.  60 tablet  5   Allergies  Allergen Reactions  . Ibuprofen Swelling    ROS: Pertinent items in HPI  OBJECTIVE Blood pressure 96/60, pulse 86, temperature  97.2 F (36.2 C), temperature source Oral, resp. rate 18. GENERAL: Well-developed, well-nourished female in mild distress.  HEENT: Normocephalic HEART: normal rate RESP: normal effort ABDOMEN: Soft, non-tender. FF @U .  EXTREMITIES: Nontender, no edema NEURO: Alert and oriented PELVIC EXAM: NEFG,  No lacerations. Small amount of bleeding. Fragment of trailing membranes removed from vagina.  LAB RESULTS No results found for this or any previous visit (from the past 24 hour(s)).  IMAGING NA  MAU COURSE  ASSESSMENT No diagnosis found.  PLAN Admit to Mother Baby per consult w/ Dr. Gaynell Face. Routine PP orders.  [place], CNM 08/31/2012  3:22 AM

## 2012-08-31 NOTE — H&P (Signed)
This is Dr. Francoise Ceo dictating the history and physical on  [name] [name] she is a 27 year old gravida 3 para 202 at 44 weeks and 2 days EDC E. 1314 negative GBS O- patient was admitted to delivery at home patient states that her contractions became an extensive stool 5 AM and she had a normal vaginal delivery at 2:25 AM and was brought to the hospital placenta was was delivered at 2 negative GBS placenta was delivered in in a you know lacerations normal lochia the patient has a history of alcohol fetal alcohol syndrome Past medical history negative Past surgical history negative Social history negative System review negative Physical exam well-developed female post delivery HEENT negative Lungs clear to P&A Heart regular rhythm no murmurs no gallops Rest negative Uterus 20 week postpartum size Pelvic as described above Extremities negative

## 2012-08-31 NOTE — MAU Note (Signed)
[name] [name] presents by EMS following a home delivery of a full term infant. Pt states she delivered the baby at 0225. Pt was [redacted]w[redacted]d

## 2012-08-31 NOTE — Progress Notes (Signed)
UR completed 

## 2012-09-01 MED ORDER — RHO D IMMUNE GLOBULIN 1500 UNIT/2ML IJ SOLN
300.0000 ug | Freq: Once | INTRAMUSCULAR | Status: AC
  Administered 2012-09-01: 300 ug via INTRAMUSCULAR
  Filled 2012-09-01: qty 2

## 2012-09-01 MED ORDER — OXYCODONE-ACETAMINOPHEN 5-325 MG PO TABS: 1.0000 | ORAL_TABLET | ORAL | Status: DC | PRN

## 2012-09-01 NOTE — Progress Notes (Signed)
Post Partum Day 1 Subjective: no complaints  Objective: Blood pressure 91/54, pulse 57, temperature 97.7 F (36.5 C), temperature source Oral, resp. rate 16, height 5\' 2"  (1.575 m), SpO2 97.00%, unknown if currently breastfeeding.  Physical Exam:  General: alert and no distress Lochia: appropriate Uterine Fundus: firm Incision: healing well DVT Evaluation: No evidence of DVT seen on physical exam.   Recent Labs  08/31/12 0550  HGB 10.3*  HCT 30.6*    Assessment/Plan: Plan for discharge tomorrow   LOS: 1 day   Janelis Stelzer A 09/01/2012, 9:07 AM

## 2012-09-01 NOTE — Discharge Summary (Signed)
Obstetric Discharge Summary Reason for Admission: onset of labor Prenatal Procedures: ultrasound Intrapartum Procedures: spontaneous vaginal delivery Postpartum Procedures: none Complications-Operative and Postpartum: none Hemoglobin  Date Value Range Status  08/31/2012 10.3* 12.0 - 15.0 g/dL Final     HCT  Date Value Range Status  08/31/2012 30.6* 36.0 - 46.0 % Final    Physical Exam:  General: alert and no distress Lochia: appropriate Uterine Fundus: firm Incision: healing well DVT Evaluation: No evidence of DVT seen on physical exam.  Discharge Diagnoses: Term Pregnancy-delivered  Discharge Information: Date: 09/01/2012 Activity: pelvic rest Diet: routine Medications: PNV, Percocet, Colace Condition: stable Instructions: refer to practice specific booklet Discharge to: home Follow-up Information   Follow up with MARSHALL,BERNARD A, MD. Schedule an appointment as soon as possible for a visit in 6 weeks.   Contact information:   [address] [place] [address] [phone]       Newborn Data: Live born female  Birth Weight: 6 lb 2.4 oz (2790 g) APGAR: ,   Home with mother.  Kyndall Amero A 09/01/2012, 10:31 AM

## 2012-09-02 LAB — RH IG WORKUP (INCLUDES ABO/RH)
Fetal Screen: NEGATIVE
Gestational Age(Wks): 39

## 2012-12-27 ENCOUNTER — Encounter (HOSPITAL_COMMUNITY): Payer: Self-pay | Admitting: Emergency Medicine

## 2012-12-27 ENCOUNTER — Emergency Department (HOSPITAL_COMMUNITY)
Admission: EM | Admit: 2012-12-27 | Discharge: 2012-12-27 | Disposition: A | Discharge status: Home / Self Care | Payer: Medicare Other | Attending: Emergency Medicine | Admitting: Emergency Medicine

## 2012-12-27 DIAGNOSIS — Z8659 Personal history of other mental and behavioral disorders: Secondary | ICD-10-CM | POA: Insufficient documentation

## 2012-12-27 DIAGNOSIS — Z8619 Personal history of other infectious and parasitic diseases: Secondary | ICD-10-CM | POA: Insufficient documentation

## 2012-12-27 DIAGNOSIS — E860 Dehydration: Secondary | ICD-10-CM

## 2012-12-27 DIAGNOSIS — R42 Dizziness and giddiness: Secondary | ICD-10-CM

## 2012-12-27 DIAGNOSIS — R51 Headache: Secondary | ICD-10-CM | POA: Insufficient documentation

## 2012-12-27 DIAGNOSIS — Z3202 Encounter for pregnancy test, result negative: Secondary | ICD-10-CM | POA: Insufficient documentation

## 2012-12-27 DIAGNOSIS — Z862 Personal history of diseases of the blood and blood-forming organs and certain disorders involving the immune mechanism: Secondary | ICD-10-CM | POA: Insufficient documentation

## 2012-12-27 LAB — COMPREHENSIVE METABOLIC PANEL
ALT: 12 U/L (ref 0–35)
Alkaline Phosphatase: 68 U/L (ref 39–117)
BUN: 8 mg/dL (ref 6–23)
Chloride: 98 mEq/L (ref 96–112)
GFR calc Af Amer: >90 mL/min (ref >90)
Glucose, Bld: 86 mg/dL (ref 70–99)
Potassium: 3.6 mEq/L (ref 3.5–5.1)
Total Bilirubin: 0.6 mg/dL (ref 0.3–1.2)

## 2012-12-27 LAB — CBC WITH DIFFERENTIAL/PLATELET
Hemoglobin: 12.5 g/dL (ref 12.0–15.0)
Lymphocytes Relative: 12 % (ref 12–46)
Lymphs Abs: 1.2 10*3/uL (ref 0.7–4.0)
MCH: 28.8 pg (ref 26.0–34.0)
Monocytes Relative: 11 % (ref 3–12)
Neutro Abs: 8 10*3/uL — ABNORMAL HIGH (ref 1.7–7.7)
Neutrophils Relative %: 77 % (ref 43–77)
RBC: 4.34 MIL/uL (ref 3.87–5.11)
WBC: 10.4 10*3/uL (ref 4.0–10.5)

## 2012-12-27 LAB — URINALYSIS W MICROSCOPIC + REFLEX CULTURE
Bilirubin Urine: NEGATIVE
Glucose, UA: NEGATIVE mg/dL
Ketones, ur: >80 mg/dL — AB
Nitrite: NEGATIVE
Protein, ur: NEGATIVE mg/dL

## 2012-12-27 LAB — RAPID URINE DRUG SCREEN, HOSP PERFORMED
Barbiturates: NOT DETECTED
Benzodiazepines: NOT DETECTED
Cocaine: NOT DETECTED
Opiates: NOT DETECTED

## 2012-12-27 MED ORDER — CEPHALEXIN 500 MG PO CAPS: 500.0000 mg | ORAL_CAPSULE | Freq: Two times a day (BID) | ORAL | Status: DC

## 2012-12-27 MED ORDER — HYDROCODONE-ACETAMINOPHEN 5-325 MG PO TABS
1.0000 | ORAL_TABLET | Freq: Once | ORAL | Status: AC
  Administered 2012-12-27: 1 via ORAL
  Filled 2012-12-27: qty 1

## 2012-12-27 MED ORDER — SODIUM CHLORIDE 0.9 % IV BOLUS (SEPSIS)
1000.0000 mL | Freq: Once | INTRAVENOUS | Status: AC
  Administered 2012-12-27: 1000 mL via INTRAVENOUS

## 2012-12-27 NOTE — ED Notes (Signed)
The patient c/o dizziness, nausea and headache was (pounding) pain; during orthostatic vitals.

## 2012-12-27 NOTE — ED Provider Notes (Signed)
CSN: [id number]     Arrival date & time 12/27/12  1804 History   None    Chief Complaint  Patient presents with  . Dizziness   (Consider location/radiation/quality/duration/timing/severity/associated sxs/prior Treatment) HPI [name] [name] is a 27 y.o. female who presents emergency department complaining of headaches and dizziness. Patient states her symptoms began about 10 years ago. States she began having intermittent headaches. Patient states that when she has headaches she feels dizzy and states that she feels like everything is spinning. Patient states that her headaches and dizziness has worsened in the last few weeks. Patient states she has taken "red pills" for her headache but it did not help. Patient unable to tell me if her dizziness feels like surrounding spinning or if she felt lightheaded. States "I just don't feel good." Patient does report history of anemia, she doesn't know what her last hemoglobin was. States she does not have a primary care physician, she has does not have one. States that she has not had a menstrual cycle this week and unsure if she's pregnant. Patient denies associated chest pain, palpitations, shortness of breath. She states "I'm not sure from having seizures." Patient states that she does not have personal history of seizures but states that it runs in her family. When asked why she thinks she has seizures patient replied that she sometimes feels disoriented, and yesterday states he was walking to the store and forgot where she was going." Pt denies recent fever, chills, neck pain or stiffness, photophobia.     Past Medical History  Diagnosis Date  . History of chlamydia   . Depression     post partum  . 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 Appendectomy     Family History  Problem Relation Age of Onset  . Alcohol abuse Mother    History  Substance Use Topics  .  Smoking status: Never Smoker   . Smokeless tobacco: Never Used  . Alcohol Use: No   OB History   Grav Para Term Preterm Abortions TAB SAB Ect Mult Living   3 3 3  0 0 0 0 0 0 3     Review of Systems  Constitutional: Negative for fever and chills.  Respiratory: Negative for cough, chest tightness and shortness of breath.   Cardiovascular: Negative for chest pain, palpitations and leg swelling.  Gastrointestinal: Negative for nausea, vomiting, abdominal pain and diarrhea.  Genitourinary: Negative for dysuria, flank pain, vaginal bleeding, vaginal discharge, vaginal pain and pelvic pain.  Musculoskeletal: Negative for arthralgias, myalgias, neck pain and neck stiffness.  Skin: Negative for rash.  Neurological: Positive for dizziness, light-headedness and headaches. Negative for weakness and numbness.  All other systems reviewed and are negative.    Allergies  Ibuprofen  Home Medications   Current Outpatient Rx  Name  Route  Sig  Dispense  Refill  . OVER THE COUNTER MEDICATION   Oral   Take 2 tablets by mouth daily as needed (pain). Headache medication          BP 105/69  Pulse 93  Temp(Src) 98.4 F (36.9 C) (Oral)  Resp 16  SpO2 98% Physical Exam  Nursing note and vitals reviewed. Constitutional: She is oriented to person, place, and time. She appears well-developed and well-nourished. No distress.  HENT:  Head: Normocephalic.  Eyes: Conjunctivae and EOM are normal. Pupils are equal, round, and reactive to light.  Neck:  Normal range of motion. Neck supple.  Cardiovascular: Normal rate, regular rhythm and normal heart sounds.   Pulmonary/Chest: Effort normal and breath sounds normal. No respiratory distress. She has no wheezes. She has no rales.  Abdominal: Soft. Bowel sounds are normal. She exhibits no distension. There is no tenderness. There is no rebound.  Musculoskeletal: She exhibits no edema.  Lymphadenopathy:    She has no cervical adenopathy.  Neurological:  She is alert and oriented to person, place, and time.  5/5 and equal upper and lower extremity strength bilaterally. Equal grip strength bilaterally. Normal finger to nose and heel to shin. No pronator drift. Patellar reflexes 2+   Skin: Skin is warm and dry.  Psychiatric: She has a normal mood and affect. Her behavior is normal.    ED Course  Procedures (including critical care time) Labs Review Labs Reviewed  CBC WITH DIFFERENTIAL - Abnormal; Notable for the following:    Neutro Abs 8.0 (*)    Monocytes Absolute 1.1 (*)    All other components within normal limits  URINALYSIS W MICROSCOPIC + REFLEX CULTURE - Abnormal; Notable for the following:    APPearance CLOUDY (*)    Ketones, ur >80 (*)    Leukocytes, UA LARGE (*)    Bacteria, UA FEW (*)    Squamous Epithelial / LPF MANY (*)    All other components within normal limits  URINE CULTURE  COMPREHENSIVE METABOLIC PANEL  URINE RAPID DRUG SCREEN (HOSP PERFORMED)  POCT PREGNANCY, URINE   Imaging Review No results found.  EKG Interpretation   None       MDM   1. Dehydration   2. Dizziness     Pt with headache and dizziness on and off for 10years. Never had evaluation for this same. PE unremarkable. Orthostatic VS checked, HR went up from 67 laying down, to 118 standing. No significant change in BP. Suspect pt may be dehydrated. Labs are normal. UA shows >80 ketones, consistent with possible dehydration. Few bacteria, although sample contaminated. Will give a bolus of fluids and reassess. Pt does admit to not eating or drinking enough.   States also stressed, single mother to 3 kids at home, no help. States she is not sleeping but 3-4 hrs at night. States has a 54 month old, 25yo, and 25yo.   10:17 PM Bolus given. Pt feeling slightly better. Will d/c home.      Lottie Mussel, PA-C 12/28/12 [phone]

## 2012-12-27 NOTE — ED Notes (Signed)
Pt states she has felt dizzy for 10 years and it seems to be getting worse this year. States "sometimes i feel like i might fall out." denies head injury. A&ox4

## 2012-12-29 LAB — URINE CULTURE

## 2012-12-30 NOTE — ED Provider Notes (Signed)
Medical screening examination/treatment/procedure(s) were performed by non-physician practitioner and as supervising physician I was immediately available for consultation/collaboration.  EKG Interpretation   None         Gwyneth Sprout, MD 12/30/12 1630

## 2013-01-24 NOTE — L&D Delivery Note (Signed)
Delivery Note At 8:07 PM a viable female was delivered via Vaginal, Spontaneous Delivery (Presentation: Left Occiput Anterior) precipitously. Faculty practice was called to room as patient precipitously delivered and initially infant had poor tone and cry, needed cord clamped so that infant could be transferred to the warmer. APGAR: 7, 8; weight 7 lb 3 oz (3260 g).   Placenta status: Intact, Spontaneous.  Cord: 3 vessels with the following complications: None.   Anesthesia: Local  Episiotomy: None Lacerations: None Suture Repair: None Est. Blood Loss (mL): 150 cc  Mom to postpartum.  Baby to Couplet care / Skin to Skin.  William DaltonMcEachern, [name] [name] 11/04/2013, 8:26 PM

## 2013-04-08 LAB — OB RESULTS CONSOLE RPR: RPR: NONREACTIVE

## 2013-04-08 LAB — OB RESULTS CONSOLE ABO/RH: RH TYPE: NEGATIVE

## 2013-04-08 LAB — OB RESULTS CONSOLE GC/CHLAMYDIA
CHLAMYDIA, DNA PROBE: NEGATIVE
Gonorrhea: NEGATIVE

## 2013-04-08 LAB — OB RESULTS CONSOLE HIV ANTIBODY (ROUTINE TESTING): HIV: NONREACTIVE

## 2013-04-08 LAB — OB RESULTS CONSOLE HEPATITIS B SURFACE ANTIGEN: HEP B S AG: NEGATIVE

## 2013-04-08 LAB — OB RESULTS CONSOLE ANTIBODY SCREEN: Antibody Screen: NEGATIVE

## 2013-04-08 LAB — OB RESULTS CONSOLE RUBELLA ANTIBODY, IGM: RUBELLA: IMMUNE

## 2013-08-26 ENCOUNTER — Inpatient Hospital Stay (HOSPITAL_COMMUNITY)
Admission: AD | Admit: 2013-08-26 | Discharge: 2013-08-26 | Disposition: A | Discharge status: Home / Self Care | Payer: Medicare Other | Source: Ambulatory Visit | Attending: Obstetrics | Admitting: Obstetrics

## 2013-08-26 ENCOUNTER — Encounter (HOSPITAL_COMMUNITY): Payer: Self-pay | Admitting: *Deleted

## 2013-08-26 DIAGNOSIS — O23593 Infection of other part of genital tract in pregnancy, third trimester: Secondary | ICD-10-CM

## 2013-08-26 DIAGNOSIS — A5901 Trichomonal vulvovaginitis: Secondary | ICD-10-CM

## 2013-08-26 DIAGNOSIS — M549 Dorsalgia, unspecified: Secondary | ICD-10-CM

## 2013-08-26 DIAGNOSIS — O98819 Other maternal infectious and parasitic diseases complicating pregnancy, unspecified trimester: Secondary | ICD-10-CM | POA: Insufficient documentation

## 2013-08-26 DIAGNOSIS — R109 Unspecified abdominal pain: Secondary | ICD-10-CM | POA: Insufficient documentation

## 2013-08-26 DIAGNOSIS — O99891 Other specified diseases and conditions complicating pregnancy: Secondary | ICD-10-CM

## 2013-08-26 DIAGNOSIS — O9989 Other specified diseases and conditions complicating pregnancy, childbirth and the puerperium: Secondary | ICD-10-CM

## 2013-08-26 DIAGNOSIS — O26899 Other specified pregnancy related conditions, unspecified trimester: Secondary | ICD-10-CM

## 2013-08-26 LAB — URINALYSIS, ROUTINE W REFLEX MICROSCOPIC
Bilirubin Urine: NEGATIVE
Glucose, UA: NEGATIVE mg/dL
Ketones, ur: >80 mg/dL — AB
Nitrite: NEGATIVE
PROTEIN: 30 mg/dL — AB
Specific Gravity, Urine: 1.02 (ref 1.005–1.030)
UROBILINOGEN UA: 1 mg/dL (ref 0.0–1.0)
pH: 6 (ref 5.0–8.0)

## 2013-08-26 LAB — URINE MICROSCOPIC-ADD ON

## 2013-08-26 LAB — WET PREP, GENITAL
CLUE CELLS WET PREP: NONE SEEN
Yeast Wet Prep HPF POC: NONE SEEN

## 2013-08-26 MED ORDER — LACTATED RINGERS IV BOLUS (SEPSIS)
1000.0000 mL | Freq: Once | INTRAVENOUS | Status: AC
  Administered 2013-08-26: 1000 mL via INTRAVENOUS

## 2013-08-26 MED ORDER — ACETAMINOPHEN 325 MG PO TABS
650.0000 mg | ORAL_TABLET | Freq: Once | ORAL | Status: AC
  Administered 2013-08-26: 650 mg via ORAL
  Filled 2013-08-26: qty 2

## 2013-08-26 MED ORDER — CYCLOBENZAPRINE HCL 10 MG PO TABS
10.0000 mg | ORAL_TABLET | Freq: Once | ORAL | Status: AC
  Administered 2013-08-26: 10 mg via ORAL
  Filled 2013-08-26: qty 1

## 2013-08-26 MED ORDER — METRONIDAZOLE 500 MG PO TABS
2000.0000 mg | ORAL_TABLET | Freq: Once | ORAL | Status: AC
  Administered 2013-08-26: 2000 mg via ORAL
  Filled 2013-08-26: qty 4

## 2013-08-26 MED ORDER — CYCLOBENZAPRINE HCL 10 MG PO TABS: 10.0000 mg | ORAL_TABLET | Freq: Every evening | ORAL | Status: DC | PRN

## 2013-08-26 NOTE — MAU Provider Note (Signed)
History     CSN: [id number]  Arrival date and time: 08/26/13 1156   First Provider Initiated Contact with Patient 08/26/13 1244      Chief Complaint  Patient presents with  . Abdominal Cramping   HPI Comments: [name] [name] 28 y.o. [id number] [redacted]w[redacted]d comes to MAU today with back and abdominal pains that have been ongoing for 5 days. She did call the office and was told to come to MAU. She has been in the process of moving and has been doing a lot of lifting. She is having some vaginal discharge. Her husband is incarcerated and she has not been sexually active. She knows she has had an ultrasound and was never told there was any problem with the placenta.   Abdominal Cramping      Past Medical History  Diagnosis Date  . History of chlamydia   . Depression     post partum  . Fetal alcohol syndrome     high functioning mental retardation    Past Surgical History  Procedure Laterality Date  . Appendectomy      Family History  Problem Relation Age of Onset  . Alcohol abuse Mother     History  Substance Use Topics  . Smoking status: Never Smoker   . Smokeless tobacco: Never Used  . Alcohol Use: No    Allergies:  Allergies  Allergen Reactions  . Ibuprofen Swelling    No prescriptions prior to admission    Review of Systems  Constitutional: Negative.   HENT: Negative.   Eyes: Negative.   Respiratory: Negative.   Cardiovascular: Negative.   Gastrointestinal: Positive for abdominal pain.  Genitourinary:       Vaginal discharge  Musculoskeletal: Positive for back pain.  Skin: Negative.   Neurological: Negative.   Psychiatric/Behavioral:       Has fetal alcohol syndrome   Physical Exam   Blood pressure 103/59, pulse 108, temperature 97.9 F (36.6 C), resp. rate 18, last menstrual period 01/27/2013, unknown if currently breastfeeding.  Physical Exam  Constitutional: She is oriented to person, place, and time. She appears well-developed and well-nourished.  No distress.  HENT:  Head: Normocephalic and atraumatic.  Eyes: Pupils are equal, round, and reactive to light.  Cardiovascular: Normal rate, regular rhythm and normal heart sounds.   Respiratory: Effort normal and breath sounds normal. No respiratory distress.  GI: Soft. She exhibits no distension. There is no tenderness. There is no rebound and no guarding.  Genitourinary:  Genital:external negative Vaginal:moderate amount thick creamy discharge Cervix:closed/ thick and friable Bimanual: nontender/ gravid   Musculoskeletal: Normal range of motion. She exhibits tenderness.  Low back tenderness  Neurological: She is alert and oriented to person, place, and time.  Skin: Skin is warm and dry.  Psychiatric: She has a normal mood and affect. Her behavior is normal. Thought content normal.  slow   Results for orders placed during the hospital encounter of 08/26/13 (from the past 24 hour(s))  WET PREP, GENITAL     Status: Abnormal   Collection Time    08/26/13  1:01 PM      Result Value Ref Range   Yeast Wet Prep HPF POC NONE SEEN  NONE SEEN   Trich, Wet Prep FEW (*) NONE SEEN   Clue Cells Wet Prep HPF POC NONE SEEN  NONE SEEN   WBC, Wet Prep HPF POC MANY (*) NONE SEEN  URINALYSIS, ROUTINE W REFLEX MICROSCOPIC     Status: Abnormal   Collection Time  08/26/13  2:35 PM      Result Value Ref Range   Color, Urine YELLOW  YELLOW   APPearance HAZY (*) CLEAR   Specific Gravity, Urine 1.020  1.005 - 1.030   pH 6.0  5.0 - 8.0   Glucose, UA NEGATIVE  NEGATIVE mg/dL   Hgb urine dipstick MODERATE (*) NEGATIVE   Bilirubin Urine NEGATIVE  NEGATIVE   Ketones, ur >80 (*) NEGATIVE mg/dL   Protein, ur 30 (*) NEGATIVE mg/dL   Urobilinogen, UA 1.0  0.0 - 1.0 mg/dL   Nitrite NEGATIVE  NEGATIVE   Leukocytes, UA LARGE (*) NEGATIVE  URINE MICROSCOPIC-ADD ON     Status: Abnormal   Collection Time    08/26/13  2:35 PM      Result Value Ref Range   Squamous Epithelial / LPF MANY (*) RARE   WBC, UA  TOO NUMEROUS TO COUNT  <3 WBC/hpf   RBC / HPF TOO NUMEROUS TO COUNT  <3 RBC/hpf   Bacteria, UA MANY (*) RARE   Urine-Other TRICHOMONAS PRESENT       MAU Course  Procedures  MDM Will give tylenol/ flexeril and she feels much better  TOCO: FHR 140. Accelerations. No contractions  Spoke with Dr Gaynell FaceMarshall who advised it was ok to send patient home with Flexeril and avoid lifting Urine was not available before speaking with Dr Gaynell FaceMarshall. Will start IV and give 1 liter fluids. Give Flagyl 2 Grams and add GC/ Chlamydia to urine Feeling well and ready to go home at 4:20 pm  Assessment and Plan   A: Back pains Abdominal pains Trich  P: Above orders Partner referral Fluids/ rest/ tylenol Flexeril 10 mg po qhs prn muscle spasm Await results GC/ Chlamydia  Carolynn ServeBarefoot, [name] [name] 08/26/2013, 3:51 PM

## 2013-08-26 NOTE — Discharge Instructions (Signed)
Abdominal Pain During Pregnancy Abdominal pain is common in pregnancy. Most of the time, it does not cause harm. There are many causes of abdominal pain. Some causes are more serious than others. Some of the causes of abdominal pain in pregnancy are easily diagnosed. Occasionally, the diagnosis takes time to understand. Other times, the cause is not determined. Abdominal pain can be a sign that something is very wrong with the pregnancy, or the pain may have nothing to do with the pregnancy at all. For this reason, always tell your health care provider if you have any abdominal discomfort. HOME CARE INSTRUCTIONS  Monitor your abdominal pain for any changes. The following actions may help to alleviate any discomfort you are experiencing:  Do not have sexual intercourse or put anything in your vagina until your symptoms go away completely.  Get plenty of rest until your pain improves.  Drink clear fluids if you feel nauseous. Avoid solid food as long as you are uncomfortable or nauseous.  Only take over-the-counter or prescription medicine as directed by your health care provider.  Keep all follow-up appointments with your health care provider. SEEK IMMEDIATE MEDICAL CARE IF:  You are bleeding, leaking fluid, or passing tissue from the vagina.  You have increasing pain or cramping.  You have persistent vomiting.  You have painful or bloody urination.  You have a fever.  You notice a decrease in your baby's movements.  You have extreme weakness or feel faint.  You have shortness of breath, with or without abdominal pain.  You develop a severe headache with abdominal pain.  You have abnormal vaginal discharge with abdominal pain.  You have persistent diarrhea.  You have abdominal pain that continues even after rest, or gets worse. MAKE SURE YOU:   Understand these instructions.  Will watch your condition.  Will get help right away if you are not doing well or get  worse. Document Released: 01/10/2005 Document Revised: 10/31/2012 Document Reviewed: 08/09/2012 [facility] Patient Information 2015 [place], [place]. This information is not intended to replace advice given to you by your health care provider. Make sure you discuss any questions you have with your health care provider. Trichomoniasis Trichomoniasis is an infection caused by an organism called Trichomonas. The infection can affect both women and men. In women, the outer female genitalia and the vagina are affected. In men, the penis is mainly affected, but the prostate and other reproductive organs can also be involved. Trichomoniasis is a sexually transmitted infection (STI) and is most often passed to another person through sexual contact.  RISK FACTORS  Having unprotected sexual intercourse.  Having sexual intercourse with an infected partner. SIGNS AND SYMPTOMS  Symptoms of trichomoniasis in women include:  Abnormal gray-green frothy vaginal discharge.  Itching and irritation of the vagina.  Itching and irritation of the area outside the vagina. Symptoms of trichomoniasis in men include:   Penile discharge with or without pain.  Pain during urination. This results from inflammation of the urethra. DIAGNOSIS  Trichomoniasis may be found during a Pap test or physical exam. Your health care provider may use one of the following methods to help diagnose this infection:  Examining vaginal discharge under a microscope. For men, urethral discharge would be examined.  Testing the pH of the vagina with a test tape.  Using a vaginal swab test that checks for the Trichomonas organism. A test is available that provides results within a few minutes.  Doing a culture test for the organism. This is not  usually needed. TREATMENT   You may be given medicine to fight the infection. Women should inform their health care provider if they could be or are pregnant. Some medicines used to treat the  infection should not be taken during pregnancy.  Your health care provider may recommend over-the-counter medicines or creams to decrease itching or irritation.  Your sexual partner will need to be treated if infected. HOME CARE INSTRUCTIONS   Take medicines only as directed by your health care provider.  Take over-the-counter medicine for itching or irritation as directed by your health care provider.  Do not have sexual intercourse while you have the infection.  Women should not douche or wear tampons while they have the infection.  Discuss your infection with your partner. Your partner may have gotten the infection from you, or you may have gotten it from your partner.  Have your sex partner get examined and treated if necessary.  Practice safe, informed, and protected sex.  See your health care provider for other STI testing. SEEK MEDICAL CARE IF:   You still have symptoms after you finish your medicine.  You develop abdominal pain.  You have pain when you urinate.  You have bleeding after sexual intercourse.  You develop a rash.  Your medicine makes you sick or makes you throw up (vomit). MAKE SURE YOU:  Understand these instructions.  Will watch your condition.  Will get help right away if you are not doing well or get worse. Document Released: 07/06/2000 Document Revised: 05/27/2013 Document Reviewed: 10/22/2012 [facility] Patient Information 2015 [place], [place]. This information is not intended to replace advice given to you by your health care provider. Make sure you discuss any questions you have with your health care provider. Trichomonas Test The trichomonas test is done to diagnose trichomoniasis, an infection caused by an organism called Trichomonas. Trichomoniasis is a sexually transmitted infection (STI). In women, it causes vaginal infections. In men, it can cause the tube that carries urine (urethra) to become inflamed (urethritis). You may have this  test as a part of a routine screening for STIs or if you have symptoms of trichomoniasis. To perform the test, your health care provider will take a sample of discharge. The sample is taken from the vagina or cervix in women and from the urethra in men. A urine sample can also be used for testing. RESULTS It is your responsibility to obtain your test results. Ask the lab or department performing the test when and how you will get your results. Contact your health care provider to discuss any questions you have about your results.  Meaning of Negative Test Results A negative test means you do not have trichomoniasis. Follow your health care provider's directions about any follow-up testing.  Meaning of Positive Test Results A positive test result means you have an active infection that needs to be treated with antibiotic medicine. All your current sexual partners must also be treated or it is likely you will get reinfected.  If your test is positive, your health care provider will start you on medicine and may advise you to:  Not have sexual intercourse until your infection has cleared up.  Use a latex condom properly every time you have sexual intercourse.  Limit the number of sexual partners you have. The more partners you have, the greater your risk of contracting trichomoniasis or another STI.  Tell all sexual partners about your infection so they can also be treated and to prevent reinfection. Document Released: 02/13/2004  Document Revised: 05/27/2013 Document Reviewed: 01/22/2013 [facility] Patient Information 2015 [place], [place]. This information is not intended to replace advice given to you by your health care provider. Make sure you discuss any questions you have with your health care provider. Back Pain in Pregnancy Back pain during pregnancy is common. It happens in about half of all pregnancies. It is important for you and your baby that you remain active during your pregnancy.If you  feel that back pain is not allowing you to remain active or sleep well, it is time to see your caregiver. Back pain may be caused by several factors related to changes during your pregnancy.Fortunately, unless you had trouble with your back before your pregnancy, the pain is likely to get better after you deliver. Low back pain usually occurs between the fifth and seventh months of pregnancy. It can, however, happen in the first couple months. Factors that increase the risk of back problems include:   Previous back problems.  Injury to your back.  Having twins or multiple births.  A chronic cough.  Stress.  Job-related repetitive motions.  Muscle or spinal disease in the back.  Family history of back problems, ruptured (herniated) discs, or osteoporosis.  Depression, anxiety, and panic attacks. CAUSES   When you are pregnant, your body produces a hormone called relaxin. This hormonemakes the ligaments connecting the low back and pubic bones more flexible. This flexibility allows the baby to be delivered more easily. When your ligaments are loose, your muscles need to work harder to support your back. Soreness in your back can come from tired muscles. Soreness can also come from back tissues that are irritated since they are receiving less support.  As the baby grows, it puts pressure on the nerves and blood vessels in your pelvis. This can cause back pain.  As the baby grows and gets heavier during pregnancy, the uterus pushes the stomach muscles forward and changes your center of gravity. This makes your back muscles work harder to maintain good posture. SYMPTOMS  Lumbar pain during pregnancy Lumbar pain during pregnancy usually occurs at or above the waist in the center of the back. There may be pain and numbness that radiates into your leg or foot. This is similar to low back pain experienced by non-pregnant women. It usually increases with sitting for long periods of time,  standing, or repetitive lifting. Tenderness may also be present in the muscles along your upper back. Posterior pelvic pain during pregnancy Pain in the back of the pelvis is more common than lumbar pain in pregnancy. It is a deep pain felt in your side at the waistline, or across the tailbone (sacrum), or in both places. You may have pain on one or both sides. This pain can also go into the buttocks and backs of the upper thighs. Pubic and groin pain may also be present. The pain does not quickly resolve with rest, and morning stiffness may also be present. Pelvic pain during pregnancy can be brought on by most activities. A high level of fitness before and during pregnancy may or may not prevent this problem. Labor pain is usually 1 to 2 minutes apart, lasts for about 1 minute, and involves a bearing down feeling or pressure in your pelvis. However, if you are at term with the pregnancy, constant low back pain can be the beginning of early labor, and you should be aware of this. DIAGNOSIS  X-rays of the back should not be done during the first 12  to 14 weeks of the pregnancy and only when absolutely necessary during the rest of the pregnancy. MRIs do not give off radiation and are safe during pregnancy. MRIs also should only be done when absolutely necessary. HOME CARE INSTRUCTIONS  Exercise as directed by your caregiver. Exercise is the most effective way to prevent or manage back pain. If you have a back problem, it is especially important to avoid sports that require sudden body movements. Swimming and walking are great activities.  Do not stand in one place for long periods of time.  Do not wear high heels.  Sit in chairs with good posture. Use a pillow on your lower back if necessary. Make sure your head rests over your shoulders and is not hanging forward.  Try sleeping on your side, preferably the left side, with a pillow or two between your legs. If you are sore after a night's rest, your  bedmay betoo soft.Try placing a board between your mattress and box spring.  Listen to your body when lifting.If you are experiencing pain, ask for help or try bending yourknees more so you can use your leg muscles rather than your back muscles. Squat down when picking up something from the floor. Do not bend over.  Eat a healthy diet. Try to gain weight within your caregiver's recommendations.  Use heat or cold packs 3 to 4 times a day for 15 minutes to help with the pain.  Only take over-the-counter or prescription medicines for pain, discomfort, or fever as directed by your caregiver. Sudden (acute) back pain  Use bed rest for only the most extreme, acute episodes of back pain. Prolonged bed rest over 48 hours will aggravate your condition.  Ice is very effective for acute conditions.  Put ice in a plastic bag.  Place a towel between your skin and the bag.  Leave the ice on for 10 to 20 minutes every 2 hours, or as needed.  Using heat packs for 30 minutes prior to activities is also helpful. Continued back pain See your caregiver if you have continued problems. Your caregiver can help or refer you for appropriate physical therapy. With conditioning, most back problems can be avoided. Sometimes, a more serious issue may be the cause of back pain. You should be seen right away if new problems seem to be developing. Your caregiver may recommend:  A maternity girdle.  An elastic sling.  A back brace.  A massage therapist or acupuncture. SEEK MEDICAL CARE IF:   You are not able to do most of your daily activities, even when taking the pain medicine you were given.  You need a referral to a physical therapist or chiropractor.  You want to try acupuncture. SEEK IMMEDIATE MEDICAL CARE IF:  You develop numbness, tingling, weakness, or problems with the use of your arms or legs.  You develop severe back pain that is no longer relieved with medicines.  You have a sudden  change in bowel or bladder control.  You have increasing pain in other areas of the body.  You develop shortness of breath, dizziness, or fainting.  You develop nausea, vomiting, or sweating.  You have back pain which is similar to labor pains.  You have back pain along with your water breaking or vaginal bleeding.  You have back pain or numbness that travels down your leg.  Your back pain developed after you fell.  You develop pain on one side of your back. You may have a kidney stone.  You see blood in your urine. You may have a bladder infection or kidney stone.  You have back pain with blisters. You may have shingles. Back pain is fairly common during pregnancy but should not be accepted as just part of the process. Back pain should always be treated as soon as possible. This will make your pregnancy as pleasant as possible. Document Released: 04/20/2005 Document Revised: 04/04/2011 Document Reviewed: 06/01/2010 [facility] Patient Information 2015 [place], [place]. This information is not intended to replace advice given to you by your health care provider. Make sure you discuss any questions you have with your health care provider.

## 2013-08-26 NOTE — [facility] Note (Signed)
Pt presents to [facility]U with complaints of abdominal pain that started 5 days ago. Also reports a small amount of vaginal bleeding 2 days ago.

## 2013-10-05 LAB — OB RESULTS CONSOLE GBS: GBS: NEGATIVE

## 2013-10-16 ENCOUNTER — Inpatient Hospital Stay (HOSPITAL_COMMUNITY)
Admission: AD | Admit: 2013-10-16 | Discharge: 2013-10-16 | Disposition: A | Discharge status: Home / Self Care | Payer: Medicare Other | Source: Ambulatory Visit | Attending: Obstetrics | Admitting: Obstetrics

## 2013-10-16 ENCOUNTER — Encounter (HOSPITAL_COMMUNITY): Payer: Self-pay

## 2013-10-16 DIAGNOSIS — O4700 False labor before 37 completed weeks of gestation, unspecified trimester: Secondary | ICD-10-CM | POA: Diagnosis not present

## 2013-10-16 NOTE — Discharge Instructions (Signed)
Braxton Hicks Contractions °Contractions of the uterus can occur throughout pregnancy. Contractions are not always a sign that you are in labor.  °WHAT ARE BRAXTON HICKS CONTRACTIONS?  °Contractions that occur before labor are called Braxton Hicks contractions, or false labor. Toward the end of pregnancy (32-34 weeks), these contractions can develop more often and may become more forceful. This is not true labor because these contractions do not result in opening (dilatation) and thinning of the cervix. They are sometimes difficult to tell apart from true labor because these contractions can be forceful and people have different pain tolerances. You should not feel embarrassed if you go to the hospital with false labor. Sometimes, the only way to tell if you are in true labor is for your health care provider to look for changes in the cervix. °If there are no prenatal problems or other health problems associated with the pregnancy, it is completely safe to be sent home with false labor and await the onset of true labor. °HOW CAN YOU TELL THE DIFFERENCE BETWEEN TRUE AND FALSE LABOR? °False Labor °· The contractions of false labor are usually shorter and not as hard as those of true labor.   °· The contractions are usually irregular.   °· The contractions are often felt in the front of the lower abdomen and in the groin.   °· The contractions may go away when you walk around or change positions while lying down.   °· The contractions get weaker and are shorter lasting as time goes on.   °· The contractions do not usually become progressively stronger, regular, and closer together as with true labor.   °True Labor °· Contractions in true labor last 30-70 seconds, become very regular, usually become more intense, and increase in frequency.   °· The contractions do not go away with walking.   °· The discomfort is usually felt in the top of the uterus and spreads to the lower abdomen and low back.   °· True labor can be  determined by your health care provider with an exam. This will show that the cervix is dilating and getting thinner.   °WHAT TO REMEMBER °· Keep up with your usual exercises and follow other instructions given by your health care provider.   °· Take medicines as directed by your health care provider.   °· Keep your regular prenatal appointments.   °· Eat and drink lightly if you think you are going into labor.   °· If Braxton Hicks contractions are making you uncomfortable:   °¨ Change your position from lying down or resting to walking, or from walking to resting.   °¨ Sit and rest in a tub of warm water.   °¨ Drink 2-3 glasses of water. Dehydration may cause these contractions.   °¨ Do slow and deep breathing several times an hour.   °WHEN SHOULD I SEEK IMMEDIATE MEDICAL CARE? °Seek immediate medical care if: °· Your contractions become stronger, more regular, and closer together.   °· You have fluid leaking or gushing from your vagina.   °· You have a fever.   °· You pass blood-tinged mucus.   °· You have vaginal bleeding.   °· You have continuous abdominal pain.   °· You have low back pain that you never had before.   °· You feel your baby's head pushing down and causing pelvic pressure.   °· Your baby is not moving as much as it used to.   °Document Released: 01/10/2005 Document Revised: 01/15/2013 Document Reviewed: 10/22/2012 °[employer]® Patient Information ©2015 [employer]. This information is not intended to replace advice given to you by your health care   provider. Make sure you discuss any questions you have with your health care provider. Third Trimester of Pregnancy The third trimester is from week 29 through week 42, months 7 through 9. The third trimester is a time when the fetus is growing rapidly. At the end of the ninth month, the fetus is about 20 inches in length and weighs 6-10 pounds.  BODY CHANGES Your body goes through many changes during pregnancy. The changes vary from woman to woman.    Your weight will continue to increase. You can expect to gain 25-35 pounds (11-16 kg) by the end of the pregnancy.  You may begin to get stretch marks on your hips, abdomen, and breasts.  You may urinate more often because the fetus is moving lower into your pelvis and pressing on your bladder.  You may develop or continue to have heartburn as a result of your pregnancy.  You may develop constipation because certain hormones are causing the muscles that push waste through your intestines to slow down.  You may develop hemorrhoids or swollen, bulging veins (varicose veins).  You may have pelvic pain because of the weight gain and pregnancy hormones relaxing your joints between the bones in your pelvis. Backaches may result from overexertion of the muscles supporting your posture.  You may have changes in your hair. These can include thickening of your hair, rapid growth, and changes in texture. Some women also have hair loss during or after pregnancy, or hair that feels dry or thin. Your hair will most likely return to normal after your baby is born.  Your breasts will continue to grow and be tender. A yellow discharge may leak from your breasts called colostrum.  Your belly button may stick out.  You may feel short of breath because of your expanding uterus.  You may notice the fetus "dropping," or moving lower in your abdomen.  You may have a bloody mucus discharge. This usually occurs a few days to a week before labor begins.  Your cervix becomes thin and soft (effaced) near your due date. WHAT TO EXPECT AT YOUR PRENATAL EXAMS  You will have prenatal exams every 2 weeks until week 36. Then, you will have weekly prenatal exams. During a routine prenatal visit:  You will be weighed to make sure you and the fetus are growing normally.  Your blood pressure is taken.  Your abdomen will be measured to track your baby's growth.  The fetal heartbeat will be listened to.  Any test  results from the previous visit will be discussed.  You may have a cervical check near your due date to see if you have effaced. At around 36 weeks, your caregiver will check your cervix. At the same time, your caregiver will also perform a test on the secretions of the vaginal tissue. This test is to determine if a type of bacteria, Group B streptococcus, is present. Your caregiver will explain this further. Your caregiver may ask you:  What your birth plan is.  How you are feeling.  If you are feeling the baby move.  If you have had any abnormal symptoms, such as leaking fluid, bleeding, severe headaches, or abdominal cramping.  If you have any questions. Other tests or screenings that may be performed during your third trimester include:  Blood tests that check for low iron levels (anemia).  Fetal testing to check the health, activity level, and growth of the fetus. Testing is done if you have certain medical conditions or if  there are problems during the pregnancy. FALSE LABOR You may feel small, irregular contractions that eventually go away. These are called Braxton Hicks contractions, or false labor. Contractions may last for hours, days, or even weeks before true labor sets in. If contractions come at regular intervals, intensify, or become painful, it is best to be seen by your caregiver.  SIGNS OF LABOR   Menstrual-like cramps.  Contractions that are 5 minutes apart or less.  Contractions that start on the top of the uterus and spread down to the lower abdomen and back.  A sense of increased pelvic pressure or back pain.  A watery or bloody mucus discharge that comes from the vagina. If you have any of these signs before the 37th week of pregnancy, call your caregiver right away. You need to go to the hospital to get checked immediately. HOME CARE INSTRUCTIONS   Avoid all smoking, herbs, alcohol, and unprescribed drugs. These chemicals affect the formation and growth of  the baby.  Follow your caregiver's instructions regarding medicine use. There are medicines that are either safe or unsafe to take during pregnancy.  Exercise only as directed by your caregiver. Experiencing uterine cramps is a good sign to stop exercising.  Continue to eat regular, healthy meals.  Wear a good support bra for breast tenderness.  Do not use hot tubs, steam rooms, or saunas.  Wear your seat belt at all times when driving.  Avoid raw meat, uncooked cheese, cat litter boxes, and soil used by cats. These carry germs that can cause birth defects in the baby.  Take your prenatal vitamins.  Try taking a stool softener (if your caregiver approves) if you develop constipation. Eat more high-fiber foods, such as fresh vegetables or fruit and whole grains. Drink plenty of fluids to keep your urine clear or pale yellow.  Take warm sitz baths to soothe any pain or discomfort caused by hemorrhoids. Use hemorrhoid cream if your caregiver approves.  If you develop varicose veins, wear support hose. Elevate your feet for 15 minutes, 3-4 times a day. Limit salt in your diet.  Avoid heavy lifting, wear low heal shoes, and practice good posture.  Rest a lot with your legs elevated if you have leg cramps or low back pain.  Visit your dentist if you have not gone during your pregnancy. Use a soft toothbrush to brush your teeth and be gentle when you floss.  A sexual relationship may be continued unless your caregiver directs you otherwise.  Do not travel far distances unless it is absolutely necessary and only with the approval of your caregiver.  Take prenatal classes to understand, practice, and ask questions about the labor and delivery.  Make a trial run to the hospital.  Pack your hospital bag.  Prepare the baby's nursery.  Continue to go to all your prenatal visits as directed by your caregiver. SEEK MEDICAL CARE IF:  You are unsure if you are in labor or if your water  has broken.  You have dizziness.  You have mild pelvic cramps, pelvic pressure, or nagging pain in your abdominal area.  You have persistent nausea, vomiting, or diarrhea.  You have a bad smelling vaginal discharge.  You have pain with urination. SEEK IMMEDIATE MEDICAL CARE IF:   You have a fever.  You are leaking fluid from your vagina.  You have spotting or bleeding from your vagina.  You have severe abdominal cramping or pain.  You have rapid weight loss or gain.  You have shortness of breath with chest pain.  You notice sudden or extreme swelling of your face, hands, ankles, feet, or legs.  You have not felt your baby move in over an hour.  You have severe headaches that do not go away with medicine.  You have vision changes. Document Released: 01/04/2001 Document Revised: 01/15/2013 Document Reviewed: 03/13/2012 [facility] Patient Information 2015 [place], [place]. This information is not intended to replace advice given to you by your health care provider. Make sure you discuss any questions you have with your health care provider.

## 2013-10-16 NOTE — MAU Note (Signed)
Patient states she has been having lower abdominal pain for 2 days and thinks she might be 1 cm dilated. Patient is unable to sit in triage chair so was taken to room 1.

## 2013-10-16 NOTE — MAU Note (Signed)
Pt presents complaining of contractions for the last 2 days. Complaining of spotting when wiping. Denies vaginal discharge or leaking of fluid. Reports good fetal movement.

## 2013-10-16 NOTE — Progress Notes (Signed)
Notified of pt arrival in MAU, vaginal exam and uterine activity.

## 2013-11-04 ENCOUNTER — Encounter (HOSPITAL_COMMUNITY): Payer: Self-pay | Admitting: *Deleted

## 2013-11-04 ENCOUNTER — Inpatient Hospital Stay (HOSPITAL_COMMUNITY)
Admission: AD | Admit: 2013-11-04 | Discharge: 2013-11-04 | Disposition: A | Discharge status: Home / Self Care | Payer: Medicare Other | Source: Ambulatory Visit | Attending: Obstetrics | Admitting: Obstetrics

## 2013-11-04 ENCOUNTER — Inpatient Hospital Stay (HOSPITAL_COMMUNITY)
Admission: AD | Admit: 2013-11-04 | Discharge: 2013-11-06 | DRG: 775 | Disposition: A | Discharge status: Home / Self Care | Payer: Medicare Other | Source: Ambulatory Visit | Attending: Obstetrics | Admitting: Obstetrics

## 2013-11-04 DIAGNOSIS — IMO0001 Reserved for inherently not codable concepts without codable children: Secondary | ICD-10-CM

## 2013-11-04 DIAGNOSIS — Z3A40 40 weeks gestation of pregnancy: Secondary | ICD-10-CM | POA: Diagnosis present

## 2013-11-04 DIAGNOSIS — O354XX0 Maternal care for (suspected) damage to fetus from alcohol, not applicable or unspecified: Secondary | ICD-10-CM | POA: Diagnosis not present

## 2013-11-04 DIAGNOSIS — O99344 Other mental disorders complicating childbirth: Secondary | ICD-10-CM

## 2013-11-04 DIAGNOSIS — O471 False labor at or after 37 completed weeks of gestation: Secondary | ICD-10-CM | POA: Insufficient documentation

## 2013-11-04 DIAGNOSIS — Z3483 Encounter for supervision of other normal pregnancy, third trimester: Secondary | ICD-10-CM | POA: Diagnosis present

## 2013-11-04 LAB — CBC
HCT: 31.1 % — ABNORMAL LOW (ref 36.0–46.0)
HEMOGLOBIN: 9.8 g/dL — AB (ref 12.0–15.0)
MCH: 25.9 pg — AB (ref 26.0–34.0)
MCHC: 31.5 g/dL (ref 30.0–36.0)
MCV: 82.3 fL (ref 78.0–100.0)
Platelets: 300 10*3/uL (ref 150–400)
RBC: 3.78 MIL/uL — ABNORMAL LOW (ref 3.87–5.11)
RDW: 14.7 % (ref 11.5–15.5)
WBC: 10.2 10*3/uL (ref 4.0–10.5)

## 2013-11-04 LAB — RPR

## 2013-11-04 LAB — HIV ANTIBODY (ROUTINE TESTING W REFLEX): HIV 1&2 Ab, 4th Generation: NONREACTIVE

## 2013-11-04 MED ORDER — BUTORPHANOL TARTRATE 1 MG/ML IJ SOLN
1.0000 mg | INTRAMUSCULAR | Status: DC | PRN
  Administered 2013-11-04 (×3): 1 mg via INTRAVENOUS
  Filled 2013-11-04 (×3): qty 1

## 2013-11-04 MED ORDER — MAGNESIUM HYDROXIDE 400 MG/5ML PO SUSP: 30.0000 mL | ORAL | Status: DC | PRN

## 2013-11-04 MED ORDER — LANOLIN HYDROUS EX OINT: TOPICAL_OINTMENT | CUTANEOUS | Status: DC | PRN

## 2013-11-04 MED ORDER — BENZOCAINE-MENTHOL 20-0.5 % EX AERO: 1.0000 "application " | INHALATION_SPRAY | CUTANEOUS | Status: DC | PRN

## 2013-11-04 MED ORDER — OXYTOCIN 40 UNITS IN LACTATED RINGERS INFUSION - SIMPLE MED: 62.5000 mL/h | INTRAVENOUS | Status: DC

## 2013-11-04 MED ORDER — LIDOCAINE HCL (PF) 1 % IJ SOLN
30.0000 mL | INTRAMUSCULAR | Status: DC | PRN
  Filled 2013-11-04: qty 30

## 2013-11-04 MED ORDER — CITRIC ACID-SODIUM CITRATE 334-500 MG/5ML PO SOLN: 30.0000 mL | ORAL | Status: DC | PRN

## 2013-11-04 MED ORDER — FERROUS SULFATE 325 (65 FE) MG PO TABS
325.0000 mg | ORAL_TABLET | Freq: Two times a day (BID) | ORAL | Status: DC
  Administered 2013-11-05 (×2): 325 mg via ORAL
  Filled 2013-11-04 (×2): qty 1

## 2013-11-04 MED ORDER — DIPHENHYDRAMINE HCL 25 MG PO CAPS: 25.0000 mg | ORAL_CAPSULE | Freq: Four times a day (QID) | ORAL | Status: DC | PRN

## 2013-11-04 MED ORDER — TETANUS-DIPHTH-ACELL PERTUSSIS 5-2.5-18.5 LF-MCG/0.5 IM SUSP
0.5000 mL | Freq: Once | INTRAMUSCULAR | Status: AC
  Administered 2013-11-05: 0.5 mL via INTRAMUSCULAR

## 2013-11-04 MED ORDER — PRENATAL MULTIVITAMIN CH: 1.0000 | ORAL_TABLET | Freq: Every day | ORAL | Status: DC

## 2013-11-04 MED ORDER — DIBUCAINE 1 % RE OINT: 1.0000 "application " | TOPICAL_OINTMENT | RECTAL | Status: DC | PRN

## 2013-11-04 MED ORDER — ACETAMINOPHEN 325 MG PO TABS
650.0000 mg | ORAL_TABLET | ORAL | Status: DC | PRN
Start: 2013-11-04 — End: 2013-11-04

## 2013-11-04 MED ORDER — ZOLPIDEM TARTRATE 5 MG PO TABS: 5.0000 mg | ORAL_TABLET | Freq: Every evening | ORAL | Status: DC | PRN

## 2013-11-04 MED ORDER — MEASLES, MUMPS & RUBELLA VAC ~~LOC~~ INJ: 0.5000 mL | INJECTION | Freq: Once | SUBCUTANEOUS | Status: DC

## 2013-11-04 MED ORDER — OXYCODONE-ACETAMINOPHEN 5-325 MG PO TABS: 2.0000 | ORAL_TABLET | ORAL | Status: DC | PRN

## 2013-11-04 MED ORDER — TERBUTALINE SULFATE 1 MG/ML IJ SOLN
0.2500 mg | Freq: Once | INTRAMUSCULAR | Status: DC | PRN
Start: 2013-11-04 — End: 2013-11-04

## 2013-11-04 MED ORDER — WITCH HAZEL-GLYCERIN EX PADS: 1.0000 "application " | MEDICATED_PAD | CUTANEOUS | Status: DC | PRN

## 2013-11-04 MED ORDER — ONDANSETRON HCL 4 MG/2ML IJ SOLN: 4.0000 mg | INTRAMUSCULAR | Status: DC | PRN

## 2013-11-04 MED ORDER — OXYCODONE-ACETAMINOPHEN 5-325 MG PO TABS
1.0000 | ORAL_TABLET | ORAL | Status: DC | PRN
  Administered 2013-11-04 – 2013-11-06 (×9): 1 via ORAL
  Filled 2013-11-04 (×9): qty 1

## 2013-11-04 MED ORDER — OXYTOCIN 40 UNITS IN LACTATED RINGERS INFUSION - SIMPLE MED
1.0000 m[IU]/min | INTRAVENOUS | Status: DC
  Administered 2013-11-04: 1 m[IU]/min via INTRAVENOUS
  Filled 2013-11-04: qty 1000

## 2013-11-04 MED ORDER — LACTATED RINGERS IV SOLN
INTRAVENOUS | Status: DC
  Administered 2013-11-04 (×2): via INTRAVENOUS

## 2013-11-04 MED ORDER — SENNOSIDES-DOCUSATE SODIUM 8.6-50 MG PO TABS
2.0000 | ORAL_TABLET | ORAL | Status: DC
  Administered 2013-11-04: 2 via ORAL
  Filled 2013-11-04 (×2): qty 2

## 2013-11-04 MED ORDER — ONDANSETRON HCL 4 MG PO TABS: 4.0000 mg | ORAL_TABLET | ORAL | Status: DC | PRN

## 2013-11-04 MED ORDER — OXYTOCIN BOLUS FROM INFUSION
500.0000 mL | INTRAVENOUS | Status: DC
  Administered 2013-11-04: 500 mL via INTRAVENOUS

## 2013-11-04 MED ORDER — LACTATED RINGERS IV SOLN: 500.0000 mL | INTRAVENOUS | Status: DC | PRN

## 2013-11-04 MED ORDER — OXYCODONE-ACETAMINOPHEN 5-325 MG PO TABS: 1.0000 | ORAL_TABLET | ORAL | Status: DC | PRN

## 2013-11-04 MED ORDER — ONDANSETRON HCL 4 MG/2ML IJ SOLN
4.0000 mg | Freq: Four times a day (QID) | INTRAMUSCULAR | Status: DC | PRN
  Administered 2013-11-04: 4 mg via INTRAVENOUS
  Filled 2013-11-04: qty 2

## 2013-11-04 NOTE — Progress Notes (Signed)
Dr Tamela OddiJackson-Moore updated on FHR, UC pattern, membrane status, pt comfort level, pain interventions, and SVE.  Orders given for pitocin augmentation to begin at 1 milliunit/min and increase by 1 milliunit/min per protocol.

## 2013-11-04 NOTE — Progress Notes (Signed)
Tracing reviewed with Bonnielee Haff. Gagnon, RNC and advised that patient may go to 164.

## 2013-11-04 NOTE — Progress Notes (Signed)
Dr Gaynell FaceMarshall notified of no change in VE, orders received to discharge home

## 2013-11-04 NOTE — MAU Note (Signed)
Having contractions, vomiting.

## 2013-11-04 NOTE — Discharge Instructions (Signed)

## 2013-11-04 NOTE — MAU Note (Signed)
Pt states that contractions started at 0535 this morning. Denies SROM, denies vaginal bleeding. Positive fetal movement. Denies any risk factors for pregnancy.

## 2013-11-05 DIAGNOSIS — O354XX0 Maternal care for (suspected) damage to fetus from alcohol, not applicable or unspecified: Secondary | ICD-10-CM | POA: Diagnosis not present

## 2013-11-05 LAB — CBC
HEMATOCRIT: 27.9 % — AB (ref 36.0–46.0)
Hemoglobin: 8.8 g/dL — ABNORMAL LOW (ref 12.0–15.0)
MCH: 26 pg (ref 26.0–34.0)
MCHC: 31.5 g/dL (ref 30.0–36.0)
MCV: 82.3 fL (ref 78.0–100.0)
Platelets: 258 10*3/uL (ref 150–400)
RBC: 3.39 MIL/uL — ABNORMAL LOW (ref 3.87–5.11)
RDW: 14.3 % (ref 11.5–15.5)
WBC: 14.4 10*3/uL — AB (ref 4.0–10.5)

## 2013-11-05 NOTE — Progress Notes (Signed)
CSW attempted to meet with the MOB in order to complete the assessment.  MOB had recently taken pain medication and was drowsy.  Due to medications, MOB was difficult to engage.  CSW will re-attempt either later today or tomorrow morning.  

## 2013-11-05 NOTE — H&P (Signed)
This is Dr. Francoise CeoBernard Marshall dictating the history and physical on  [name] [name] she's a 28 year old gravida 4 para 553 old 3 at 40 weeks and and a negative GBS was admitted in labor 4 cm 90% vertex -1 Past medical history patient has fetal alcohol syndrome Past surgical history negative Social history negative System review negative you Physical exam well-developed female in labor HEENT negative Breasts negative Lungs clear to P&A Heart regular rhythm no murmurs no gallops Abdomen term Pelvic as described above Extremities negative

## 2013-11-05 NOTE — Lactation Note (Signed)
This note was copied from the chart of [name] Aris Evertsiffany Orihuela. Lactation Consultation Note; Initial visit with mom. She has baby latched to breast when I went into room. Assisted mom to get the baby closer to the breast- is letting her slide to tip of nipple. Encouraged good pillow support of her elbow. Latched to other breast when I left room. Mom states she plans to nurse baby while she is here in hospital and at home until she gets settled. Did this with previous babies. Bf brochure given with resources for support after DC,No questions at present. To call for assist prn.   Patient Name: [name] [name] AOZHY'QToday's Date: 11/05/2013 Reason for consult: Initial assessment   Maternal Data Formula Feeding for Exclusion: Yes Reason for exclusion: Mother's choice to formula and breast feed on admission Has patient been taught Hand Expression?: Yes Does the patient have breastfeeding experience prior to this delivery?: Yes  Feeding Feeding Type: Breast Fed Length of feed: 30 min  LATCH Score/Interventions Latch: Grasps breast easily, tongue down, lips flanged, rhythmical sucking.  Audible Swallowing: A few with stimulation  Type of Nipple: Everted at rest and after stimulation  Comfort (Breast/Nipple): Soft / non-tender     Hold (Positioning): No assistance needed to correctly position infant at breast. Intervention(s): Support Pillows  LATCH Score: 9  Lactation Tools Discussed/Used     Consult Status Consult Status: PRN    [name] HoitWeeks, Shaneece Stockburger D 11/05/2013, 10:23 AM

## 2013-11-06 NOTE — Progress Notes (Signed)
CSW confirmed with [place] CPS that there is no open case.  Case was closed on 9/24.    CSW completed assessment, full documentation to follow.    CSW spoke with Ashley from [facility] who provides in-home parenting skills 2 times per week. Per Ashley, she intends to meet with the MOB on 10/16.  CSW also confirmed with Ashley that MOB is currently receiving in-home therapy with Angel Boyd who meets with her one time per week. MOB has all basic needs for the baby and has numerous community supports in her home.   No barriers to discharge.  

## 2013-11-06 NOTE — Progress Notes (Signed)
Patient ID: [name] [name], female   DOB: 1985-09-04, 28 y.o.   MRN: [id number] Postpartum day 2 Vital signs normal Fundus firm Lochia moderate Home today

## 2013-11-06 NOTE — Discharge Instructions (Signed)
if  bleeding more than a period notify me  °No sex for 3 weeks °See me in 3 weeks  °Resume normal activity in am °

## 2013-11-06 NOTE — Progress Notes (Signed)
Clinical Social Work Department PSYCHOSOCIAL ASSESSMENT - MATERNAL/CHILD 08-16-2013  Patient:  [name] [name]  Account Number:  [id number]  [place] Date:  2013-04-16  Ardine Eng Name:   [name] Needle   Clinical Social Worker:  Lucita Ferrara, [place]   Date/Time:  01-13-2014 09:30 AM  Date Referred:  11/06/2013   Referral source  Central Nursery     Referred reason  Behavioral Health Issues   Other referral source:    I:  FAMILY / HOME ENVIRONMENT Child's legal guardian:  PARENT  Guardian - Name Guardian - Age Guardian - Address  Robin Searing [address] [place], [place] [address]  Tonia Avino  currently incarcerated   Other household support members/support persons Other support:   MOB stated that her mother is supportive as long as she is not drinking.  She shared that her cousin is a positive support person.    II  PSYCHOSOCIAL DATA Information Source:  Patient Interview  Insurance risk surveyor Resources Employment:   MOB is unemployed.   Financial resources:  Medicaid If Medicaid - County:  [place] / Grade:  N/A Music therapist / Child Services Coordination / Early Interventions:  Cultural issues impacting care:   None reported    III  STRENGTHS Strengths  Adequate Resources  Home prepared for Child (including basic supplies)   Strength comment:  MOB is well-organized and had all of her documents from community agencies documenting her servies that she receives.   IV  RISK FACTORS AND CURRENT PROBLEMS Current Problem:  YES   Risk Factor & Current Problem Patient Issue Family Issue Risk Factor / Current Problem Comment  Mental Illness Y N MOB presents with a history of depression.  MOB is currently receiving therapy.  Family/Relationship Issues Y N FOB is currently incarcerated. He has been incarcerated since June/July.    V  SOCIAL WORK ASSESSMENT CSW met with the MOB in her room in  order to complete the assessment. Consult was ordered due to MOB presenting with a history of depression.  FOB is also incarcerated and MOB has diagnosis of fetal alcohol syndrome. MOB was resting when CSW entered, but was able to engage upon CSW request.  MOB presented with limited range in affect, but was in a pleasant mood.   MOB expressed excitement as she reflected upon her thoughts and feelings of having her 4th child.  She openly shared numerous stressors that may negatively impact her transition into the postpartum period, including the FOB being incarcerated and needing to move because of cockroaches both during June/July of 2015.  MOB expressed feeling overwhelmed by these stressors and raising her children, but shared that she does have the support of her mother and her cousin.  MOB expressed strong belief that her community services in place,  [facility] and her in-home therapy (1x/week), assist with ensuring that all basic needs are met and helping her process her thoughts and feelings related to these stressors.  MOB called her providers while CSW was in the room, and CSW confirmed that [facility] will follow-up with the MOB on 10/16.    CSW inquired about history of CPS involvement.  MOB admitted to CPS report being made due to "leaving her children alone".  MOB denied current case and stated that the case was quickly.  MOB provided CSW with a copy of the safety plan that was completed in August by [place] worker.  CSW also  confirmed with Dana White, intake, that there is no open CPS case.     MOB is aware of increased risk for developing postpartum depression.  She was receptive to re-education on signs and symptoms, and was agreeable to calling her providers if she experiences symptoms.    VI SOCIAL WORK PLAN Social Work Plan  Patient/Family Education  No Further Intervention Required / No Barriers to Discharge   Type of pt/family education:   Postpartum depression    If child protective services report - county:   If child protective services report - date:   Information/referral to community resources comment:   No referrals needed at this time.  MOB is currently receiving [facility] from [facility] and is currently receiving mental health therapy.   Other social work plan:   CSW confirmed with Dana White that there is no current CPS case.  CSW to provide emotional support PRN.     

## 2013-11-06 NOTE — Discharge Summary (Signed)
Obstetric Discharge Summary Reason for Admission: onset of labor Prenatal Procedures: none Intrapartum Procedures: spontaneous vaginal delivery Postpartum Procedures: none Complications-Operative and Postpartum: none Hemoglobin  Date Value Ref Range Status  11/05/2013 8.8* 12.0 - 15.0 g/dL Final     HCT  Date Value Ref Range Status  11/05/2013 27.9* 36.0 - 46.0 % Final    Physical Exam:  General: alert Lochia: appropriate Uterine Fundus: firm Incision: healing well DVT Evaluation: No evidence of DVT seen on physical exam.  Discharge Diagnoses: Term Pregnancy-delivered  Discharge Information: Date: 11/06/2013 Activity: unrestricted Diet: routine Medications: Percocet Condition: improved Instructions: refer to practice specific booklet Discharge to: home Follow-up Information   Follow up In 6 weeks.      Newborn Data: Live born female  Birth Weight: 7 lb 3 oz (3260 g) APGAR: 7, 8  Home with mother.  MARSHALL,BERNARD A 11/06/2013, 6:58 AM

## 2013-11-25 ENCOUNTER — Encounter (HOSPITAL_COMMUNITY): Payer: Self-pay | Admitting: *Deleted

## 2014-03-31 ENCOUNTER — Encounter (HOSPITAL_COMMUNITY): Payer: Self-pay | Admitting: *Deleted

## 2014-03-31 ENCOUNTER — Emergency Department (HOSPITAL_COMMUNITY)
Admission: EM | Admit: 2014-03-31 | Discharge: 2014-04-01 | Disposition: A | Discharge status: Home / Self Care | Payer: Medicare Other | Attending: Emergency Medicine | Admitting: Emergency Medicine

## 2014-03-31 DIAGNOSIS — R42 Dizziness and giddiness: Secondary | ICD-10-CM | POA: Diagnosis not present

## 2014-03-31 DIAGNOSIS — Z79899 Other long term (current) drug therapy: Secondary | ICD-10-CM | POA: Insufficient documentation

## 2014-03-31 DIAGNOSIS — Z3202 Encounter for pregnancy test, result negative: Secondary | ICD-10-CM | POA: Insufficient documentation

## 2014-03-31 DIAGNOSIS — Z8619 Personal history of other infectious and parasitic diseases: Secondary | ICD-10-CM | POA: Diagnosis not present

## 2014-03-31 DIAGNOSIS — Z791 Long term (current) use of non-steroidal anti-inflammatories (NSAID): Secondary | ICD-10-CM | POA: Insufficient documentation

## 2014-03-31 DIAGNOSIS — R51 Headache: Secondary | ICD-10-CM | POA: Insufficient documentation

## 2014-03-31 DIAGNOSIS — Z8659 Personal history of other mental and behavioral disorders: Secondary | ICD-10-CM | POA: Insufficient documentation

## 2014-03-31 DIAGNOSIS — R519 Headache, unspecified: Secondary | ICD-10-CM

## 2014-03-31 DIAGNOSIS — Q860 Fetal alcohol syndrome (dysmorphic): Secondary | ICD-10-CM | POA: Diagnosis not present

## 2014-03-31 DIAGNOSIS — R29810 Facial weakness: Secondary | ICD-10-CM | POA: Insufficient documentation

## 2014-03-31 DIAGNOSIS — Z7951 Long term (current) use of inhaled steroids: Secondary | ICD-10-CM | POA: Insufficient documentation

## 2014-03-31 NOTE — ED Notes (Signed)
Per [facility], pt from home, pt reports h/a which started today.  EMS reports upon arrival to home, pt noted to be running around with her children while talking on the phone.

## 2014-04-01 DIAGNOSIS — R51 Headache: Secondary | ICD-10-CM | POA: Diagnosis not present

## 2014-04-01 LAB — URINALYSIS, ROUTINE W REFLEX MICROSCOPIC
Bilirubin Urine: NEGATIVE
Glucose, UA: NEGATIVE mg/dL
Hgb urine dipstick: NEGATIVE
KETONES UR: NEGATIVE mg/dL
NITRITE: NEGATIVE
PH: 7 (ref 5.0–8.0)
Protein, ur: NEGATIVE mg/dL
SPECIFIC GRAVITY, URINE: 1.011 (ref 1.005–1.030)
UROBILINOGEN UA: 0.2 mg/dL (ref 0.0–1.0)

## 2014-04-01 LAB — URINE MICROSCOPIC-ADD ON

## 2014-04-01 LAB — PREGNANCY, URINE: Preg Test, Ur: NEGATIVE

## 2014-04-01 MED ORDER — BUTALBITAL-APAP-CAFFEINE 50-325-40 MG PO TABS
1.0000 | ORAL_TABLET | Freq: Once | ORAL | Status: AC
  Administered 2014-04-01: 1 via ORAL
  Filled 2014-04-01: qty 1
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
## 2014-04-01 NOTE — ED Notes (Signed)
Pt states she has a headache all over her head. Started around 14:00 yesterday. Pain described as pressure, sharp, and aching. Intermittent dizziness, lightheaded, and light sensitivity.

## 2014-04-01 NOTE — Discharge Instructions (Signed)
Please continue taking the medication your doctor prescribed for your headaches and dizziness.  Drink plenty of fluids.  Follow-up with your Dr. for recheck in 3-5 days.  Return to the emergency department for worsening condition or new concerning symptoms.    Headaches, Frequently Asked Questions MIGRAINE HEADACHES Q: What is migraine? What causes it? How can I treat it? A: Generally, migraine headaches begin as a dull ache. Then they develop into a constant, throbbing, and pulsating pain. You may experience pain at the temples. You may experience pain at the front or back of one or both sides of the head. The pain is usually accompanied by a combination of:  Nausea.  Vomiting.  Sensitivity to light and noise. Some people (about 15%) experience an aura (see below) before an attack. The cause of migraine is believed to be chemical reactions in the brain. Treatment for migraine may include over-the-counter or prescription medications. It may also include self-help techniques. These include relaxation training and biofeedback.  Q: What is an aura? A: About 15% of people with migraine get an "aura". This is a sign of neurological symptoms that occur before a migraine headache. You may see wavy or jagged lines, dots, or flashing lights. You might experience tunnel vision or blind spots in one or both eyes. The aura can include visual or auditory hallucinations (something imagined). It may include disruptions in smell (such as strange odors), taste or touch. Other symptoms include:  Numbness.  A "pins and needles" sensation.  Difficulty in recalling or speaking the correct word. These neurological events may last as long as 60 minutes. These symptoms will fade as the headache begins. Q: What is a trigger? A: Certain physical or environmental factors can lead to or "trigger" a migraine. These include:  Foods.  Hormonal changes.  Weather.  Stress. It is important to remember that triggers  are different for everyone. To help prevent migraine attacks, you need to figure out which triggers affect you. Keep a headache diary. This is a good way to track triggers. The diary will help you talk to your healthcare professional about your condition. Q: Does weather affect migraines? A: Bright sunshine, hot, humid conditions, and drastic changes in barometric pressure may lead to, or "trigger," a migraine attack in some people. But studies have shown that weather does not act as a trigger for everyone with migraines. Q: What is the link between migraine and hormones? A: Hormones start and regulate many of your body's functions. Hormones keep your body in balance within a constantly changing environment. The levels of hormones in your body are unbalanced at times. Examples are during menstruation, pregnancy, or menopause. That can lead to a migraine attack. In fact, about three quarters of all women with migraine report that their attacks are related to the menstrual cycle.  Q: Is there an increased risk of stroke for migraine sufferers? A: The likelihood of a migraine attack causing a stroke is very remote. That is not to say that migraine sufferers cannot have a stroke associated with their migraines. In persons under age 27, the most common associated factor for stroke is migraine headache. But over the course of a person's normal life span, the occurrence of migraine headache may actually be associated with a reduced risk of dying from cerebrovascular disease due to stroke.  Q: What are acute medications for migraine? A: Acute medications are used to treat the pain of the headache after it has started. Examples over-the-counter medications, NSAIDs, ergots, and  triptans.  Q: What are the triptans? A: Triptans are the newest class of abortive medications. They are specifically targeted to treat migraine. Triptans are vasoconstrictors. They moderate some chemical reactions in the brain. The triptans  work on receptors in your brain. Triptans help to restore the balance of a neurotransmitter called serotonin. Fluctuations in levels of serotonin are thought to be a main cause of migraine.  Q: Are over-the-counter medications for migraine effective? A: Over-the-counter, or "OTC," medications may be effective in relieving mild to moderate pain and associated symptoms of migraine. But you should see your caregiver before beginning any treatment regimen for migraine.  Q: What are preventive medications for migraine? A: Preventive medications for migraine are sometimes referred to as "prophylactic" treatments. They are used to reduce the frequency, severity, and length of migraine attacks. Examples of preventive medications include antiepileptic medications, antidepressants, beta-blockers, calcium channel blockers, and NSAIDs (nonsteroidal anti-inflammatory drugs). Q: Why are anticonvulsants used to treat migraine? A: During the past few years, there has been an increased interest in antiepileptic drugs for the prevention of migraine. They are sometimes referred to as "anticonvulsants". Both epilepsy and migraine may be caused by similar reactions in the brain.  Q: Why are antidepressants used to treat migraine? A: Antidepressants are typically used to treat people with depression. They may reduce migraine frequency by regulating chemical levels, such as serotonin, in the brain.  Q: What alternative therapies are used to treat migraine? A: The term "alternative therapies" is often used to describe treatments considered outside the scope of conventional Western medicine. Examples of alternative therapy include acupuncture, acupressure, and yoga. Another common alternative treatment is herbal therapy. Some herbs are believed to relieve headache pain. Always discuss alternative therapies with your caregiver before proceeding. Some herbal products contain arsenic and other toxins. TENSION HEADACHES Q: What is a  tension-type headache? What causes it? How can I treat it? A: Tension-type headaches occur randomly. They are often the result of temporary stress, anxiety, fatigue, or anger. Symptoms include soreness in your temples, a tightening band-like sensation around your head (a "vice-like" ache). Symptoms can also include a pulling feeling, pressure sensations, and contracting head and neck muscles. The headache begins in your forehead, temples, or the back of your head and neck. Treatment for tension-type headache may include over-the-counter or prescription medications. Treatment may also include self-help techniques such as relaxation training and biofeedback. CLUSTER HEADACHES Q: What is a cluster headache? What causes it? How can I treat it? A: Cluster headache gets its name because the attacks come in groups. The pain arrives with little, if any, warning. It is usually on one side of the head. A tearing or bloodshot eye and a runny nose on the same side of the headache may also accompany the pain. Cluster headaches are believed to be caused by chemical reactions in the brain. They have been described as the most severe and intense of any headache type. Treatment for cluster headache includes prescription medication and oxygen. SINUS HEADACHES Q: What is a sinus headache? What causes it? How can I treat it? A: When a cavity in the bones of the face and skull (a sinus) becomes inflamed, the inflammation will cause localized pain. This condition is usually the result of an allergic reaction, a tumor, or an infection. If your headache is caused by a sinus blockage, such as an infection, you will probably have a fever. An x-ray will confirm a sinus blockage. Your caregiver's treatment might include antibiotics for  the infection, as well as antihistamines or decongestants.  REBOUND HEADACHES Q: What is a rebound headache? What causes it? How can I treat it? A: A pattern of taking acute headache medications too  often can lead to a condition known as "rebound headache." A pattern of taking too much headache medication includes taking it more than 2 days per week or in excessive amounts. That means more than the label or a caregiver advises. With rebound headaches, your medications not only stop relieving pain, they actually begin to cause headaches. Doctors treat rebound headache by tapering the medication that is being overused. Sometimes your caregiver will gradually substitute a different type of treatment or medication. Stopping may be a challenge. Regularly overusing a medication increases the potential for serious side effects. Consult a caregiver if you regularly use headache medications more than 2 days per week or more than the label advises. ADDITIONAL QUESTIONS AND ANSWERS Q: What is biofeedback? A: Biofeedback is a self-help treatment. Biofeedback uses special equipment to monitor your body's involuntary physical responses. Biofeedback monitors:  Breathing.  Pulse.  Heart rate.  Temperature.  Muscle tension.  Brain activity. Biofeedback helps you refine and perfect your relaxation exercises. You learn to control the physical responses that are related to stress. Once the technique has been mastered, you do not need the equipment any more. Q: Are headaches hereditary? A: Four out of five (80%) of people that suffer report a family history of migraine. Scientists are not sure if this is genetic or a family predisposition. Despite the uncertainty, a child has a 50% chance of having migraine if one parent suffers. The child has a 75% chance if both parents suffer.  Q: Can children get headaches? A: By the time they reach high school, most young people have experienced some type of headache. Many safe and effective approaches or medications can prevent a headache from occurring or stop it after it has begun.  Q: What type of doctor should I see to diagnose and treat my headache? A: Start with  your primary caregiver. Discuss his or her experience and approach to headaches. Discuss methods of classification, diagnosis, and treatment. Your caregiver may decide to recommend you to a headache specialist, depending upon your symptoms or other physical conditions. Having diabetes, allergies, etc., may require a more comprehensive and inclusive approach to your headache. The National Headache Foundation will provide, upon request, a list of [facility] physician members in your state. Document Released: 04/02/2003 Document Revised: 04/04/2011 Document Reviewed: 09/10/2007 [facility] Patient Information 2015 [place], [place]. This information is not intended to replace advice given to you by your health care provider. Make sure you discuss any questions you have with your health care provider.  General Headache Without Cause A general headache is pain or discomfort felt around the head or neck area. The cause may not be found.  HOME CARE   Keep all doctor visits.  Only take medicines as told by your doctor.  Lie down in a dark, quiet room when you have a headache.  Keep a journal to find out if certain things bring on headaches. For example, write down:  What you eat and drink.  How much sleep you get.  Any change to your diet or medicines.  Relax by getting a massage or doing other relaxing activities.  Put ice or heat packs on the head and neck area as told by your doctor.  Lessen stress.  Sit up straight. Do not tighten (tense) your muscles.  Quit smoking if you smoke.  Lessen how much alcohol you drink.  Lessen how much caffeine you drink, or stop drinking caffeine.  Eat and sleep on a regular schedule.  Get 7 to 9 hours of sleep, or as told by your doctor.  Keep lights dim if bright lights bother you or make your headaches worse. GET HELP RIGHT AWAY IF:   Your headache becomes really bad.  You have a fever.  You have a stiff neck.  You have trouble seeing.  Your  muscles are weak, or you lose muscle control.  You lose your balance or have trouble walking.  You feel like you will pass out (faint), or you pass out.  You have really bad symptoms that are different than your first symptoms.  You have problems with the medicines given to you by your doctor.  Your medicines do not work.  Your headache feels different than the other headaches.  You feel sick to your stomach (nauseous) or throw up (vomit). MAKE SURE YOU:   Understand these instructions.  Will watch your condition.  Will get help right away if you are not doing well or get worse. Document Released: 10/20/2007 Document Revised: 04/04/2011 Document Reviewed: 12/31/2010 [facility] Patient Information 2015 [place], [place]. This information is not intended to replace advice given to you by your health care provider. Make sure you discuss any questions you have with your health care provider.

## 2014-04-02 LAB — URINE CULTURE: Colony Count: 3000

## 2014-09-19 ENCOUNTER — Emergency Department (HOSPITAL_COMMUNITY)
Admission: EM | Admit: 2014-09-19 | Discharge: 2014-09-19 | Disposition: A | Discharge status: Home / Self Care | Payer: Medicare Other | Attending: Emergency Medicine | Admitting: Emergency Medicine

## 2014-09-19 ENCOUNTER — Encounter (HOSPITAL_COMMUNITY): Payer: Self-pay | Admitting: *Deleted

## 2014-09-19 DIAGNOSIS — Z8619 Personal history of other infectious and parasitic diseases: Secondary | ICD-10-CM | POA: Diagnosis not present

## 2014-09-19 DIAGNOSIS — Z8659 Personal history of other mental and behavioral disorders: Secondary | ICD-10-CM | POA: Insufficient documentation

## 2014-09-19 DIAGNOSIS — Z7951 Long term (current) use of inhaled steroids: Secondary | ICD-10-CM | POA: Diagnosis not present

## 2014-09-19 DIAGNOSIS — Z79899 Other long term (current) drug therapy: Secondary | ICD-10-CM | POA: Insufficient documentation

## 2014-09-19 DIAGNOSIS — M654 Radial styloid tenosynovitis [de Quervain]: Secondary | ICD-10-CM | POA: Diagnosis not present

## 2014-09-19 DIAGNOSIS — Z791 Long term (current) use of non-steroidal anti-inflammatories (NSAID): Secondary | ICD-10-CM | POA: Diagnosis not present

## 2014-09-19 DIAGNOSIS — Z793 Long term (current) use of hormonal contraceptives: Secondary | ICD-10-CM | POA: Diagnosis not present

## 2014-09-19 DIAGNOSIS — M25531 Pain in right wrist: Secondary | ICD-10-CM | POA: Diagnosis present

## 2014-09-19 DIAGNOSIS — Q860 Fetal alcohol syndrome (dysmorphic): Secondary | ICD-10-CM | POA: Insufficient documentation

## 2014-09-19 NOTE — ED Notes (Signed)
Spoke to ortho.  They are on the way to place.

## 2014-09-19 NOTE — ED Notes (Signed)
Pt reports bilateral wrist pain that is worse in rt side. Reports hx of this and xrays in the past

## 2014-09-19 NOTE — ED Provider Notes (Signed)
CSN: [id number]     Arrival date & time 09/19/14  1152 History  This chart was scribed for non-physician practitioner Allen Derry, PA-C working with Benjiman Core, MD by Littie Deeds, ED Scribe. This patient was seen in room TR08C/TR08C and the patient's care was started at 12:01 PM.       Chief Complaint  Patient presents with  . Wrist Pain   Patient is a 29 y.o. female presenting with wrist pain. The history is provided by the patient. No language interpreter was used.  Wrist Pain This is a new problem. The current episode started yesterday. The problem occurs constantly. The problem has not changed since onset.Pertinent negatives include no chest pain, no abdominal pain and no shortness of breath. Exacerbated by: lifting objects. Nothing relieves the symptoms. She has tried acetaminophen for the symptoms. The treatment provided no relief.   HPI Comments: [name] [name] is a 29 y.o. female who presents to the Emergency Department complaining of constant, bilateral wrist pain, worse on the right, that started 3 days ago. 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 She describes the pain as 10/10 constant aching which radiates into her forearm, worse with lifting and the same throughout the day. She has been taking Tylenol but without relief. She states she has 4 kids and does a lot of heavy lifting during the day. Patient denies fever, chills, chest pain, SOB, abdominal pain, n/v, neck pain, redness, warmth, swelling, numbness, tingling, and focal weakness. She also denies injury or fall. She was given some medications which she does not remember the name of, by her regular doctor, but these haven't helped. No hx of gout.   Past Medical History  Diagnosis Date  . History of chlamydia   . Depression     post partum  . 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 Appendectomy     Family History  Problem Relation Age of Onset  . Alcohol abuse Mother    Social History  Substance Use Topics  . Smoking status: Never Smoker   . Smokeless tobacco: Never Used  . Alcohol Use: No   OB History    Gravida Para Term Preterm AB TAB SAB Ectopic Multiple Living   0 0 0 0 0 0 4     Review of Systems  Constitutional: Negative for fever and chills.  Respiratory: Negative for shortness of breath.   Cardiovascular: Negative for chest pain.  Gastrointestinal: Negative for nausea, vomiting and abdominal pain.  Musculoskeletal: Positive for arthralgias. Negative for joint swelling and neck pain.  Skin: Negative for color change.  Allergic/Immunologic: Negative for immunocompromised state.  Neurological: Negative for weakness and numbness.   10 Systems reviewed and are negative for acute change except as noted in the HPI.    Allergies  Ibuprofen  Home Medications   Prior to Admission medications   Medication Sig Start Date End Date Taking? Authorizing Provider  cetirizine (ZYRTEC) 10 MG tablet Take 10 mg by mouth daily. 03/05/14   Historical Provider, MD  fluticasone (FLONASE) 50 MCG/ACT nasal spray Place 1 spray into both nostrils daily.  03/05/14   Historical Provider, MD  medroxyPROGESTERone (DEPO-PROVERA) 150 MG/ML injection Inject 150 mg into the muscle every 3 (three) months.  02/11/14   Historical Provider, MD  naproxen (NAPROSYN)  500 MG tablet Take 500 mg by mouth 2 (two) times daily.  03/05/14   Historical Provider, MD  propranolol (INDERAL) 80 MG tablet Take 80 mg by mouth 2 (two) times daily. 03/05/14   Historical Provider, MD   BP 103/66 mmHg  Pulse 88  Temp(Src) 98 F (36.7 C) (Oral)  Resp 16  SpO2 98% Physical Exam  Constitutional: She is oriented to person, place, and time. Vital signs are normal. She appears well-developed and well-nourished.  Non-toxic appearance. No distress.  Afebrile, nontoxic,  NAD  HENT:  Head: Normocephalic and atraumatic.  Mouth/Throat: Mucous membranes are normal.  Eyes: Conjunctivae and EOM are normal. Right eye exhibits no discharge. Left eye exhibits no discharge.  Neck: Normal range of motion. Neck supple.  Cardiovascular: Normal rate and intact distal pulses.   Pulmonary/Chest: Effort normal. No respiratory distress.  Abdominal: Normal appearance. She exhibits no distension.  Musculoskeletal: Normal range of motion.       Right wrist: She exhibits tenderness. She exhibits normal range of motion, no bony tenderness, no swelling, no crepitus and no deformity.       Arms: Right wrist with FROM intact, with no bony TTP, with mild TTP along abductor pollicis longus tendon, with no swelling or crepitus. No deformity, bruising or erythema. No warmth. Wiggles all digits, strength and sensation grossly intact. Distal pulses intact.  Neurological: She is alert and oriented to person, place, and time. She has normal strength. No sensory deficit.  Skin: Skin is warm, dry and intact. No rash noted.  Psychiatric: She has a normal mood and affect. Her behavior is normal.  Nursing note and vitals reviewed.   ED Course  Procedures  DIAGNOSTIC STUDIES: Oxygen Saturation is 98% on room air, normal by my interpretation.    COORDINATION OF CARE: 12:08 PM-Discussed treatment plan which includes thumb spica and orthopedic follow-up with patient/guardian at bedside and patient/guardian agreed to plan.    Labs Review Labs Reviewed - No data to display  Imaging Review No results found. I have personally reviewed and evaluated these images and lab results as part of my medical decision-making.   EKG Interpretation None      MDM   Final diagnoses:  Right wrist pain  De Quervain's tenosynovitis, right    30 y.o. female here with b/l wrist pain R>L, NVI with soft compartments. Tenderness along abductor pollicis longus sheath, no bony tenderness. No injury. No  swelling or erythema. Likely dequervain's tendinitis given repetitive motions with lifting her children. Has had xrays at PCPs office, can't recall what meds she was put on, but was diagnosed with arthritis. Discussed that she can f/up with ortho in 1wk, and have thumb spica splint here to allow for rest of the wrist, then use home pain meds or tylenol/motrin for pain. Discussed heat therapy. F/up with ortho in 1wk. I explained the diagnosis and have given explicit precautions to return to the ER including for any other new or worsening symptoms. The patient understands and accepts the medical plan as it's been dictated and I have answered their questions. Discharge instructions concerning home care and prescriptions have been given. The patient is STABLE and is discharged to home in good condition.   I personally performed the services described in this documentation, which was scribed in my presence. The recorded information has been reviewed and is accurate.  BP 103/66 mmHg  Pulse 88  Temp(Src) 98 F (36.7 C) (Oral)  Resp 16  SpO2 98%   Rylee Huestis Camprubi-Soms,  PA-C 09/19/14 1224  Benjiman Core, MD 09/19/14 [phone]

## 2014-09-19 NOTE — Discharge Instructions (Signed)
Wear wrist brace for the next 3-5 days, then as needed for comfort thereafter. Use heat and elevate wrist throughout the day. Alternate between tylenol and motrin as needed for pain, and use your home pain medications as directed by your regular doctor. Call orthopedic follow up today or tomorrow to schedule followup appointment for recheck of ongoing wrist pain in 1-2 weeks that can be canceled with a 24-48 hour notice if complete resolution of pain. Return to the ER for changes or worsening symptoms.    De Quervain's Disease Suzette Battiest disease is a condition often seen in racquet sports where there is a soreness (inflammation) in the cord like structures (tendons) which attach muscle to bone on the thumb side of the wrist. There may be a tightening of the tissuesaround the tendons. This condition is often helped by giving up or modifying the activity which caused it. When conservative treatment does not help, surgery may be required. Conservative treatment could include changes in the activity which brought about the problem or made it worse. Anti-inflammatory medications and injections may be used to help decrease the inflammation and help with pain control. Your caregiver will help you determine which is best for you. DIAGNOSIS  Often the diagnosis (learning what is wrong) can be made by examination. Sometimes x-rays are required. HOME CARE INSTRUCTIONS   Apply ice to the sore area for 15-20 minutes, 03-04 times per day while awake. Put the ice in a plastic bag and place a towel between the bag of ice and your skin. This is especially helpful if it can be done after all activities involving the sore wrist.  Temporary splinting may help.  Only take over-the-counter or prescription medicines for pain, discomfort or fever as directed by your caregiver. SEEK MEDICAL CARE IF:   Pain relief is not obtained with medications, or if you have increasing pain and seem to be getting worse rather than  better. MAKE SURE YOU:   Understand these instructions.  Will watch your condition.  Will get help right away if you are not doing well or get worse. Document Released: 10/05/2000 Document Revised: 04/04/2011 Document Reviewed: 05/15/2013 [facility] Patient Information 2015 [place], [place]. This information is not intended to replace advice given to you by your health care provider. Make sure you discuss any questions you have with your health care provider.  Heat Therapy Heat therapy can help make painful, stiff muscles and joints feel better. Do not use heat on new injuries. Wait at least 48 hours after an injury to use heat. Do not use heat when you have aches or pains right after an activity. If you still have pain 3 hours after stopping the activity, then you may use heat. HOME CARE Wet heat pack  Soak a clean towel in warm water. Squeeze out the extra water.  Put the warm, wet towel in a plastic bag.  Place a thin, dry towel between your skin and the bag.  Put the heat pack on the area for 5 minutes, and check your skin. Your skin may be pink, but it should not be red.  Leave the heat pack on the area for 15 to 30 minutes.  Repeat this every 2 to 4 hours while awake. Do not use heat while you are sleeping. Warm water bath  Fill a tub with warm water.  Place the affected body part in the tub.  Soak the area for 20 to 40 minutes.  Repeat as needed. Hot water bottle  Fill  the water bottle half full with hot water.  Press out the extra air. Close the cap tightly.  Place a dry towel between your skin and the bottle.  Put the bottle on the area for 5 minutes, and check your skin. Your skin may be pink, but it should not be red.  Leave the bottle on the area for 15 to 30 minutes.  Repeat this every 2 to 4 hours while awake. Electric heating pad  Place a dry towel between your skin and the heating pad.  Set the heating pad on low heat.  Put the heating pad on the  area for 10 minutes, and check your skin. Your skin may be pink, but it should not be red.  Leave the heating pad on the area for 20 to 40 minutes.  Repeat this every 2 to 4 hours while awake.  Do not lie on the heating pad.  Do not fall asleep while using the heating pad.  Do not use the heating pad near water. GET HELP RIGHT AWAY IF:  You get blisters or red skin.  Your skin is puffy (swollen), or you lose feeling (numbness) in the affected area.  You have any new problems.  Your problems are getting worse.  You have any questions or concerns. If you have any problems, stop using heat therapy until you see your doctor. MAKE SURE YOU:  Understand these instructions.  Will watch your condition.  Will get help right away if you are not doing well or get worse. Document Released: 04/04/2011 Document Reviewed: 03/05/2013 [facility] Patient Information 2015 [place], [place]. This information is not intended to replace advice given to you by your health care provider. Make sure you discuss any questions you have with your health care provider.  Repetitive Strain Injuries Repetitive strain injuries (RSIs) result from overuse or misuse of soft tissues including muscles, tendons, or nerves. Tendons are the cord-like structures that attach muscles to bones. RSIs can affect almost any part of the body. However, RSIs are most common in the arms (thumbs, wrists, elbows, shoulders) and legs (ankles, knees). Common medical conditions that are often caused by repetitive strain include carpal tunnel syndrome, tennis or golfer's elbow, bursitis, and tendonitis. If RSIs are treated early, and therepeated activity is reduced or removed, the severity and length of your problems can usually be reduced. RSIs are also called cumulative trauma disorders (CTD).  CAUSES  Many RSIs occur due to repeating the same activity at work over weeks or months without sufficient rest, such as prolonged typing. RSIs  also commonly occur when a hobby or sport is done repeatedly without sufficient rest. RSIs can also occur due to repeated strain or stress on a body part in someone who has one or more risk factors for RSIs. RISK FACTORS Workplace risk factors  Frequent computer use, especially if your workstation is not adjusted for your body type.  Infrequent rest breaks.  Working in a high-pressure environment.  Working at a [employer].  Repeating the same motion, such as frequent typing.  Working in an awkward position or holding the same position for a long time.  Forceful movements such as lifting, pulling, or pushing.  Vibration caused by using power tools.  Working in cold temperatures.  Job stress. Personal risk factors  Poor posture.  Being loose-jointed.  Not exercising regularly.  Being overweight.  Arthritis, diabetes, thyroid problems, or other long-term (chronic)medical conditions.  Vitamin deficiencies.  Keeping your fingernails long.  An unhealthy, stressful,  or inactive lifestyle.  Not sleeping well. SYMPTOMS  Symptoms often begin at work but become more noticeable after the repeated stress has ended. For example, you may develop fatigue or soreness in your wrist while typingat work, and at night you may develop numbness and tingling in your fingers. Common symptoms include:   Burning, shooting, or aching pain, especially in the fingers, palms, wrists, forearms, or shoulders.  Tenderness.  Swelling.  Tingling, numbness, or loss of feeling.  Pain with certain activities, such as turning a doorknob or reaching above your head.  Weakness, heaviness, or loss of coordination in yourhand.  Muscle spasms or tightness. In some cases, symptoms can become so intense that it is difficult to perform everyday tasks. Symptoms that do not improve with rest may indicate a more serious condition.  DIAGNOSIS  Your caregiver may determine the type ofRSI you have based on  your medical evaluation and a description of your activities.  TREATMENT  Treatment depends on the severity and type of RSI you have. Your caregiver may recommend rest for the affected body part, medicines, and physical or occupational therapy to reduce pain, swelling, and soreness. Discuss the activities you do repeatedly with your caregiver. Your caregiver can help you decide whether you need to change your activities. An RSI may take months or years to heal, especially if the affected body part gets insufficient rest. In some cases, such as severe carpal tunnel syndrome, surgery may be recommended. PREVENTION  Talk with your supervisor to make sure you have the proper equipment for your work station.  Maintain good posture at your desk or work station with:  Feet flat on the floor.  Knees directly over the feet, bent at a right angle.  Lower back supported by your chair or a cushion in the curve of your lower back.  Shoulders and arms relaxed and at your sides.  Neck relaxed and not bent forwards or backwards.  Your desk and computer workstation properly adjusted to your body type.  Your chair adjusted so there is no excess pressure on the back of your thighs.  The keyboard resting above your thighs. You should be able to reach the keys with your elbows at your side, bent at a right angle. Your arms should be supported on forearm rests, with your forearms parallel to the ground.  The computer mouse within easy reach.  The monitor directly in front of you, so that your eyes are aligned with the top of the screen. The screen should be about 15 to 25 inches from your eyes.  While typing, keep your wrist straight, in a neutral position. Move your entire arm when you move your mouse or when typing hard-to-reach keys.  Only use your computer as much as you need to for work. Do not use it during breaks.  Take breaks often from any repeated activity. Alternate with another task which  requires you to use different muscles, or rest at least once every hour.  Change positions regularly. If you spend a lot of time sitting, get up, walk around, and stretch.  Do not hold pens or pencils tightly when writing.  Exercise regularly.  Maintain a normal weight.  Eat a diet with plenty of vegetables, whole grains, and fruit.  Get sufficient, restful sleep. HOME CARE INSTRUCTIONS  If your caregiver prescribed medicine to help reduce swelling, take it as directed.  Only take over-the-counter or prescription medicines for pain, discomfort, or fever as directed by your caregiver.  Reduce,  and if needed, stopthe activities that are causing your problems until you have no further symptoms.If your symptoms are work-related, you may need to talk to your supervisor about changing your activities.  When symptoms develop, put ice or a cold pack on the aching area.  Put ice in a plastic bag.  Place a towel between your skin and the bag.  Leave the ice on for 15-20 minutes.  If you were given a splint to keep your wrist from bending, wear it as instructed. It is important to wear the splint at night. Use the splint for as long as your caregiver recommends. SEEK MEDICAL CARE IF:  You develop new problems.  Your problems do not get better with medicine. MAKE SURE YOU:  Understand these instructions.  Will watch your condition.  Will get help right away if you are not doing well or get worse. Document Released: 12/31/2001 Document Revised: 07/12/2011 Document Reviewed: 03/03/2011 [facility] Patient Information 2015 [place], [place]. This information is not intended to replace advice given to you by your health care provider. Make sure you discuss any questions you have with your health care provider.

## 2014-09-19 NOTE — Progress Notes (Signed)
Orthopedic Tech Progress Note Patient Details:  [name] [name] 1985/07/04 [id number]  Ortho Devices Type of Ortho Device: Thumb velcro splint Ortho Device/Splint Location: rue Ortho Device/Splint Interventions: Application   Nikki Dom 09/19/2014, 12:37 PM

## 2014-09-19 NOTE — ED Notes (Signed)
Patient asking for pain medicine, wanted to use phone to call her pharmacist to find out what medicine she is on.

## 2015-03-04 DIAGNOSIS — F330 Major depressive disorder, recurrent, mild: Secondary | ICD-10-CM | POA: Diagnosis not present

## 2015-04-06 DIAGNOSIS — Z3042 Encounter for surveillance of injectable contraceptive: Secondary | ICD-10-CM | POA: Diagnosis not present

## 2015-04-06 DIAGNOSIS — L293 Anogenital pruritus, unspecified: Secondary | ICD-10-CM | POA: Diagnosis not present

## 2015-04-06 DIAGNOSIS — J019 Acute sinusitis, unspecified: Secondary | ICD-10-CM | POA: Diagnosis not present

## 2015-04-06 DIAGNOSIS — G43909 Migraine, unspecified, not intractable, without status migrainosus: Secondary | ICD-10-CM | POA: Diagnosis not present

## 2015-04-06 DIAGNOSIS — Z308 Encounter for other contraceptive management: Secondary | ICD-10-CM | POA: Diagnosis not present

## 2015-04-17 DIAGNOSIS — L293 Anogenital pruritus, unspecified: Secondary | ICD-10-CM | POA: Diagnosis not present

## 2015-04-17 DIAGNOSIS — Z113 Encounter for screening for infections with a predominantly sexual mode of transmission: Secondary | ICD-10-CM | POA: Diagnosis not present

## 2015-04-24 ENCOUNTER — Encounter (HOSPITAL_COMMUNITY): Payer: Self-pay | Admitting: Emergency Medicine

## 2015-04-24 ENCOUNTER — Emergency Department (HOSPITAL_COMMUNITY)
Admission: EM | Admit: 2015-04-24 | Discharge: 2015-04-24 | Disposition: A | Discharge status: Home / Self Care | Payer: Medicare Other | Attending: Emergency Medicine | Admitting: Emergency Medicine

## 2015-04-24 DIAGNOSIS — R1084 Generalized abdominal pain: Secondary | ICD-10-CM | POA: Diagnosis present

## 2015-04-24 DIAGNOSIS — R197 Diarrhea, unspecified: Secondary | ICD-10-CM | POA: Insufficient documentation

## 2015-04-24 DIAGNOSIS — Z79899 Other long term (current) drug therapy: Secondary | ICD-10-CM | POA: Insufficient documentation

## 2015-04-24 DIAGNOSIS — Z3202 Encounter for pregnancy test, result negative: Secondary | ICD-10-CM | POA: Diagnosis not present

## 2015-04-24 DIAGNOSIS — R1110 Vomiting, unspecified: Secondary | ICD-10-CM

## 2015-04-24 DIAGNOSIS — N390 Urinary tract infection, site not specified: Secondary | ICD-10-CM | POA: Insufficient documentation

## 2015-04-24 DIAGNOSIS — K2970 Gastritis, unspecified, without bleeding: Secondary | ICD-10-CM | POA: Diagnosis not present

## 2015-04-24 DIAGNOSIS — R112 Nausea with vomiting, unspecified: Secondary | ICD-10-CM | POA: Diagnosis not present

## 2015-04-24 DIAGNOSIS — Z8619 Personal history of other infectious and parasitic diseases: Secondary | ICD-10-CM | POA: Diagnosis not present

## 2015-04-24 DIAGNOSIS — Z7951 Long term (current) use of inhaled steroids: Secondary | ICD-10-CM | POA: Diagnosis not present

## 2015-04-24 DIAGNOSIS — Z791 Long term (current) use of non-steroidal anti-inflammatories (NSAID): Secondary | ICD-10-CM | POA: Diagnosis not present

## 2015-04-24 LAB — CBC
HCT: 38.4 % (ref 36.0–46.0)
Hemoglobin: 12.6 g/dL (ref 12.0–15.0)
MCH: 27.9 pg (ref 26.0–34.0)
MCHC: 32.8 g/dL (ref 30.0–36.0)
MCV: 85 fL (ref 78.0–100.0)
Platelets: 368 10*3/uL (ref 150–400)
RBC: 4.52 MIL/uL (ref 3.87–5.11)
RDW: 13.9 % (ref 11.5–15.5)
WBC: 15.1 10*3/uL — ABNORMAL HIGH (ref 4.0–10.5)

## 2015-04-24 LAB — COMPREHENSIVE METABOLIC PANEL
ALBUMIN: 4.4 g/dL (ref 3.5–5.0)
ALT: 20 U/L (ref 14–54)
AST: 23 U/L (ref 15–41)
Alkaline Phosphatase: 67 U/L (ref 38–126)
Anion gap: 10 (ref 5–15)
BILIRUBIN TOTAL: 0.8 mg/dL (ref 0.3–1.2)
BUN: 15 mg/dL (ref 6–20)
CALCIUM: 8.7 mg/dL — AB (ref 8.9–10.3)
CO2: 22 mmol/L (ref 22–32)
CREATININE: 0.89 mg/dL (ref 0.44–1.00)
Chloride: 106 mmol/L (ref 101–111)
GFR calc Af Amer: >60 mL/min (ref >60)
GFR calc non Af Amer: >60 mL/min (ref >60)
Glucose, Bld: 111 mg/dL — ABNORMAL HIGH (ref 65–99)
Potassium: 3.6 mmol/L (ref 3.5–5.1)
SODIUM: 138 mmol/L (ref 135–145)
TOTAL PROTEIN: 8 g/dL (ref 6.5–8.1)

## 2015-04-24 LAB — URINALYSIS, ROUTINE W REFLEX MICROSCOPIC
BILIRUBIN URINE: NEGATIVE
Glucose, UA: NEGATIVE mg/dL
KETONES UR: NEGATIVE mg/dL
Nitrite: NEGATIVE
PROTEIN: 30 mg/dL — AB
Specific Gravity, Urine: 1.03 (ref 1.005–1.030)
pH: 6 (ref 5.0–8.0)

## 2015-04-24 LAB — URINE MICROSCOPIC-ADD ON

## 2015-04-24 LAB — POC URINE PREG, ED: PREG TEST UR: NEGATIVE

## 2015-04-24 LAB — LIPASE, BLOOD: Lipase: 21 U/L (ref 11–51)

## 2015-04-24 MED ORDER — ONDANSETRON HCL 4 MG PO TABS: 4.0000 mg | ORAL_TABLET | Freq: Four times a day (QID) | ORAL | Status: DC

## 2015-04-24 MED ORDER — ONDANSETRON HCL 4 MG/2ML IJ SOLN
4.0000 mg | Freq: Once | INTRAMUSCULAR | Status: AC
  Administered 2015-04-24: 4 mg via INTRAVENOUS
  Filled 2015-04-24: qty 2

## 2015-04-24 MED ORDER — HYOSCYAMINE SULFATE 0.125 MG PO TABS
0.1250 mg | ORAL_TABLET | Freq: Once | ORAL | Status: DC
  Filled 2015-04-24: qty 1

## 2015-04-24 MED ORDER — SODIUM CHLORIDE 0.9 % IV BOLUS (SEPSIS)
1000.0000 mL | Freq: Once | INTRAVENOUS | Status: AC
  Administered 2015-04-24: 1000 mL via INTRAVENOUS

## 2015-04-24 MED ORDER — HYOSCYAMINE SULFATE 0.125 MG SL SUBL
0.1250 mg | SUBLINGUAL_TABLET | Freq: Once | SUBLINGUAL | Status: AC
  Administered 2015-04-24: 0.125 mg via ORAL
  Filled 2015-04-24: qty 1

## 2015-04-24 MED ORDER — CEPHALEXIN 500 MG PO CAPS: 500.0000 mg | ORAL_CAPSULE | Freq: Two times a day (BID) | ORAL | Status: DC

## 2015-04-24 MED ORDER — CEPHALEXIN 500 MG PO CAPS
500.0000 mg | ORAL_CAPSULE | Freq: Once | ORAL | Status: AC
  Administered 2015-04-24: 500 mg via ORAL
  Filled 2015-04-24: qty 1

## 2015-04-24 NOTE — Discharge Instructions (Signed)
Viral Gastroenteritis Viral gastroenteritis is also known as stomach flu. This condition affects the stomach and intestinal tract. It can cause sudden diarrhea and vomiting. The illness typically lasts 3 to 8 days. Most people develop an immune response that eventually gets rid of the virus. While this natural response develops, the virus can make you quite ill. CAUSES  Many different viruses can cause gastroenteritis, such as rotavirus or noroviruses. You can catch one of these viruses by consuming contaminated food or water. You may also catch a virus by sharing utensils or other personal items with an infected person or by touching a contaminated surface. SYMPTOMS  The most common symptoms are diarrhea and vomiting. These problems can cause a severe loss of body fluids (dehydration) and a body salt (electrolyte) imbalance. Other symptoms may include:  Fever.  Headache.  Fatigue.  Abdominal pain. DIAGNOSIS  Your caregiver can usually diagnose viral gastroenteritis based on your symptoms and a physical exam. A stool sample may also be taken to test for the presence of viruses or other infections. TREATMENT  This illness typically goes away on its own. Treatments are aimed at rehydration. The most serious cases of viral gastroenteritis involve vomiting so severely that you are not able to keep fluids down. In these cases, fluids must be given through an intravenous line (IV). HOME CARE INSTRUCTIONS   Drink enough fluids to keep your urine clear or pale yellow. Drink small amounts of fluids frequently and increase the amounts as tolerated.  Ask your caregiver for specific rehydration instructions.  Avoid:  Foods high in sugar.  Alcohol.  Carbonated drinks.  Tobacco.  Juice.  Caffeine drinks.  Extremely hot or cold fluids.  Fatty, greasy foods.  Too much intake of anything at one time.  Dairy products until 24 to 48 hours after diarrhea stops.  You may consume probiotics.  Probiotics are active cultures of beneficial bacteria. They may lessen the amount and number of diarrheal stools in adults. Probiotics can be found in yogurt with active cultures and in supplements.  Wash your hands well to avoid spreading the virus.  Only take over-the-counter or prescription medicines for pain, discomfort, or fever as directed by your caregiver. Do not give aspirin to children. Antidiarrheal medicines are not recommended.  Ask your caregiver if you should continue to take your regular prescribed and over-the-counter medicines.  Keep all follow-up appointments as directed by your caregiver. SEEK IMMEDIATE MEDICAL CARE IF:   You are unable to keep fluids down.  You do not urinate at least once every 6 to 8 hours.  You develop shortness of breath.  You notice blood in your stool or vomit. This may look like coffee grounds.  You have abdominal pain that increases or is concentrated in one small area (localized).  You have persistent vomiting or diarrhea.  You have a fever.  The patient is a child younger than 3 months, and he or she has a fever.  The patient is a child older than 3 months, and he or she has a fever and persistent symptoms.  The patient is a child older than 3 months, and he or she has a fever and symptoms suddenly get worse.  The patient is a baby, and he or she has no tears when crying. MAKE SURE YOU:   Understand these instructions.  Will watch your condition.  Will get help right away if you are not doing well or get worse.   This information is not intended to replace  advice given to you by your health care provider. Make sure you discuss any questions you have with your health care provider. °  °Document Released: 01/10/2005 Document Revised: 04/04/2011 Document Reviewed: 10/27/2010 °Elsevier Interactive Patient Education ©2016 [employer] ° °Urinary Tract Infection °Urinary tract infections (UTIs) can develop anywhere along your urinary  tract. Your urinary tract is your body's drainage system for removing wastes and extra water. Your urinary tract includes two kidneys, two ureters, a bladder, and a urethra. Your kidneys are a pair of bean-shaped organs. Each kidney is about the size of your fist. They are located below your ribs, one on each side of your spine. °CAUSES °Infections are caused by microbes, which are microscopic organisms, including fungi, viruses, and bacteria. These organisms are so small that they can only be seen through a microscope. Bacteria are the microbes that most commonly cause UTIs. °SYMPTOMS  °Symptoms of UTIs may vary by age and gender of the patient and by the location of the infection. Symptoms in young women typically include a frequent and intense urge to urinate and a painful, burning feeling in the bladder or urethra during urination. Older women and men are more likely to be tired, shaky, and weak and have muscle aches and abdominal pain. A fever may mean the infection is in your kidneys. Other symptoms of a kidney infection include pain in your back or sides below the ribs, nausea, and vomiting. °DIAGNOSIS °To diagnose a UTI, your caregiver will ask you about your symptoms. Your caregiver will also ask you to provide a urine sample. The urine sample will be tested for bacteria and white blood cells. White blood cells are made by your body to help fight infection. °TREATMENT  °Typically, UTIs can be treated with medication. Because most UTIs are caused by a bacterial infection, they usually can be treated with the use of antibiotics. The choice of antibiotic and length of treatment depend on your symptoms and the type of bacteria causing your infection. °HOME CARE INSTRUCTIONS °· If you were prescribed antibiotics, take them exactly as your caregiver instructs you. Finish the medication even if you feel better after you have only taken some of the medication. °· Drink enough water and fluids to keep your urine  clear or pale yellow. °· Avoid caffeine, tea, and carbonated beverages. They tend to irritate your bladder. °· Empty your bladder often. Avoid holding urine for long periods of time. °· Empty your bladder before and after sexual intercourse. °· After a bowel movement, women should cleanse from front to back. Use each tissue only once. °SEEK MEDICAL CARE IF:  °· You have back pain. °· You develop a fever. °· Your symptoms do not begin to resolve within 3 days. °SEEK IMMEDIATE MEDICAL CARE IF:  °· You have severe back pain or lower abdominal pain. °· You develop chills. °· You have nausea or vomiting. °· You have continued burning or discomfort with urination. °MAKE SURE YOU:  °· Understand these instructions. °· Will watch your condition. °· Will get help right away if you are not doing well or get worse. °  °This information is not intended to replace advice given to you by your health care provider. Make sure you discuss any questions you have with your health care provider. °  °Document Released: 10/20/2004 Document Revised: 10/01/2014 Document Reviewed: 02/18/2011 °Elsevier Interactive Patient Education ©2016 Elsevier Inc. ° °

## 2015-04-24 NOTE — ED Notes (Signed)
Bed: WA03 Expected date:  Expected time:  Means of arrival:  Comments: Hold for triage 5

## 2015-04-24 NOTE — ED Notes (Signed)
Per EMS-states N/V/D since 2 am-4 mg of Zofran and IV fluids given in route

## 2015-04-24 NOTE — ED Provider Notes (Signed)
CSN: [id number]     Arrival date & time 04/24/15  1145 History   First MD Initiated Contact with Patient 04/24/15 1309     Chief Complaint  Patient presents with  . N/V/D    . Abdominal Pain     Patient is a 30 y.o. female presenting with abdominal pain. The history is provided by the patient. No language interpreter was used.  Abdominal Pain  [name] [name] is a 30 y.o. female who presents to the Emergency Department complaining of V/D.  Patient reports vomiting and diarrhea that is severe in nature that started 2 in the morning. She has diffuse abdominal cramping. Her children were sick with similar symptoms yesterday. She denies any fevers, dysuria, Vaginal discharge. Moderate, constant, worsening.  Past Medical History  Diagnosis Date  . History of chlamydia   . Depression     post partum  . 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 Appendectomy     Family History  Problem Relation Age of Onset  . Alcohol abuse Mother    Social History  Substance Use Topics  . Smoking status: Never Smoker   . Smokeless tobacco: Never Used  . Alcohol Use: No   OB History    Gravida Para Term Preterm AB TAB SAB Ectopic Multiple Living   4 4 4  0 0 0 0 0 0 4     Review of Systems  Gastrointestinal: Positive for abdominal pain.  All other systems reviewed and are negative.     Allergies  Ibuprofen and Septra  Home Medications   Prior to Admission medications   Medication Sig Start Date End Date Taking? Authorizing Provider  fluticasone (FLONASE) 50 MCG/ACT nasal spray Place 1 spray into both nostrils daily.  03/05/14  Yes Historical Provider, MD  cephALEXin (KEFLEX) 500 MG capsule Take 1 capsule (500 mg total) by mouth 2 (two) times daily. 04/24/15   Tilden FossaElizabeth Cherilynn Schomburg, MD  cetirizine (ZYRTEC) 10 MG tablet Take 10 mg by mouth daily. 03/05/14   Historical Provider, MD  medroxyPROGESTERone (DEPO-PROVERA) 150  MG/ML injection Inject 150 mg into the muscle every 3 (three) months.  02/11/14   Historical Provider, MD  naproxen (NAPROSYN) 500 MG tablet Take 500 mg by mouth 2 (two) times daily.  03/05/14   Historical Provider, MD  ondansetron (ZOFRAN) 4 MG tablet Take 1 tablet (4 mg total) by mouth every 6 (six) hours. 04/24/15   Tilden FossaElizabeth Korissa Horsford, MD  propranolol (INDERAL) 80 MG tablet Take 80 mg by mouth 2 (two) times daily. 03/05/14   Historical Provider, MD  topiramate (TOPAMAX) 25 MG tablet Take 25 mg by mouth 2 (two) times daily. 04/06/15   Historical Provider, MD   BP 113/69 mmHg  Pulse 102  Temp(Src) 99.3 F (37.4 C) (Oral)  Resp 20  SpO2 99% Physical Exam  Constitutional: She is oriented to person, place, and time. She appears well-developed and well-nourished.  HENT:  Head: Normocephalic and atraumatic.  Cardiovascular: Normal rate and regular rhythm.   No murmur heard. Pulmonary/Chest: Effort normal and breath sounds normal. No respiratory distress.  Abdominal: Soft. There is no rebound and no guarding.  Mild diffuse abdominal tenderness  Musculoskeletal: She exhibits no edema or tenderness.  Neurological: She is alert and oriented to person, place, and time.  Skin: Skin is warm and dry.  Psychiatric: She has a normal mood and affect. Her behavior is normal.  Nursing note  and vitals reviewed.   ED Course  Procedures (including critical care time) Labs Review Labs Reviewed  COMPREHENSIVE METABOLIC PANEL - Abnormal; Notable for the following:    Glucose, Bld 111 (*)    Calcium 8.7 (*)    All other components within normal limits  CBC - Abnormal; Notable for the following:    WBC 15.1 (*)    All other components within normal limits  URINALYSIS, ROUTINE W REFLEX MICROSCOPIC (NOT AT [facility]) - Abnormal; Notable for the following:    APPearance TURBID (*)    Hgb urine dipstick LARGE (*)    Protein, ur 30 (*)    Leukocytes, UA LARGE (*)    All other components within normal limits  URINE  MICROSCOPIC-ADD ON - Abnormal; Notable for the following:    Squamous Epithelial / LPF 6-30 (*)    Bacteria, UA MANY (*)    All other components within normal limits  LIPASE, BLOOD  POC URINE PREG, ED    Imaging Review No results found. I have personally reviewed and evaluated these images and lab results as part of my medical decision-making.   EKG Interpretation None      MDM   Final diagnoses:  Vomiting and diarrhea  Acute UTI    Patient here for evaluation of vomiting, diarrhea, abdominal pain. She is nontoxic appearing on examination. No peritoneal findings on exam. CBC was leukocytosis as well as been present on multiple prior CBCs. Presentation is not consistent with bowel obstruction, diverticulitis. Patient with history of prior appendectomy. UA with questionable UTI, interpretation is limited given epithelial cells. Will treat for possible UTI.    Tilden Fossa, MD 04/24/15 [phone]

## 2015-04-24 NOTE — ED Notes (Signed)
Patient given sprite and crackers per MD

## 2015-04-24 NOTE — ED Notes (Signed)
Patient aware that a urine sample is needed. Patient unable to provide at this time.  

## 2015-04-24 NOTE — Progress Notes (Signed)
Pt noted up for d/c CM asked if she had a pcp for f/u pt states she has one with Dr Jer[facility]avlock [facility]  Pt states she has medicare and medicaid Pt request a list of accepting drs for each  CM provided a 7 page list of [place] medicaid  MDsand 2 pg list of medicare drs within 50 mile radius of [place] [phone]

## 2015-04-30 DIAGNOSIS — L0390 Cellulitis, unspecified: Secondary | ICD-10-CM | POA: Diagnosis not present

## 2015-04-30 DIAGNOSIS — B009 Herpesviral infection, unspecified: Secondary | ICD-10-CM | POA: Diagnosis not present

## 2015-05-14 DIAGNOSIS — F330 Major depressive disorder, recurrent, mild: Secondary | ICD-10-CM | POA: Diagnosis not present

## 2015-06-16 LAB — PROCEDURE REPORT - SCANNED: Pap: ABNORMAL — AB

## 2015-06-17 DIAGNOSIS — Z113 Encounter for screening for infections with a predominantly sexual mode of transmission: Secondary | ICD-10-CM | POA: Diagnosis not present

## 2015-06-23 DIAGNOSIS — F330 Major depressive disorder, recurrent, mild: Secondary | ICD-10-CM | POA: Diagnosis not present

## 2015-07-21 DIAGNOSIS — Q100 Congenital ptosis: Secondary | ICD-10-CM | POA: Diagnosis not present

## 2015-07-21 DIAGNOSIS — R51 Headache: Secondary | ICD-10-CM | POA: Diagnosis not present

## 2015-07-21 DIAGNOSIS — H01029 Squamous blepharitis unspecified eye, unspecified eyelid: Secondary | ICD-10-CM | POA: Diagnosis not present

## 2015-08-07 DIAGNOSIS — H53483 Generalized contraction of visual field, bilateral: Secondary | ICD-10-CM | POA: Diagnosis not present

## 2015-08-07 DIAGNOSIS — Z3042 Encounter for surveillance of injectable contraceptive: Secondary | ICD-10-CM | POA: Diagnosis not present

## 2015-08-07 DIAGNOSIS — R87610 Atypical squamous cells of undetermined significance on cytologic smear of cervix (ASC-US): Secondary | ICD-10-CM | POA: Diagnosis not present

## 2015-08-07 DIAGNOSIS — Z01419 Encounter for gynecological examination (general) (routine) without abnormal findings: Secondary | ICD-10-CM | POA: Diagnosis not present

## 2015-08-07 DIAGNOSIS — Q860 Fetal alcohol syndrome (dysmorphic): Secondary | ICD-10-CM | POA: Diagnosis not present

## 2015-08-07 DIAGNOSIS — Z3200 Encounter for pregnancy test, result unknown: Secondary | ICD-10-CM | POA: Diagnosis not present

## 2015-08-07 DIAGNOSIS — R875 Abnormal microbiological findings in specimens from female genital organs: Secondary | ICD-10-CM | POA: Diagnosis not present

## 2015-08-07 DIAGNOSIS — H02423 Myogenic ptosis of bilateral eyelids: Secondary | ICD-10-CM | POA: Diagnosis not present

## 2015-10-01 DIAGNOSIS — Z3042 Encounter for surveillance of injectable contraceptive: Secondary | ICD-10-CM | POA: Diagnosis not present

## 2015-10-01 DIAGNOSIS — H9203 Otalgia, bilateral: Secondary | ICD-10-CM | POA: Diagnosis not present

## 2015-10-07 DIAGNOSIS — R87610 Atypical squamous cells of undetermined significance on cytologic smear of cervix (ASC-US): Secondary | ICD-10-CM | POA: Diagnosis not present

## 2015-10-07 DIAGNOSIS — Z113 Encounter for screening for infections with a predominantly sexual mode of transmission: Secondary | ICD-10-CM | POA: Diagnosis not present

## 2015-10-07 DIAGNOSIS — A609 Anogenital herpesviral infection, unspecified: Secondary | ICD-10-CM | POA: Diagnosis not present

## 2015-10-07 DIAGNOSIS — B373 Candidiasis of vulva and vagina: Secondary | ICD-10-CM | POA: Diagnosis not present

## 2015-10-07 DIAGNOSIS — N870 Mild cervical dysplasia: Secondary | ICD-10-CM | POA: Diagnosis not present

## 2015-10-16 DIAGNOSIS — H02423 Myogenic ptosis of bilateral eyelids: Secondary | ICD-10-CM | POA: Diagnosis not present

## 2015-10-16 DIAGNOSIS — H53453 Other localized visual field defect, bilateral: Secondary | ICD-10-CM | POA: Diagnosis not present

## 2015-10-18 ENCOUNTER — Encounter: Payer: Self-pay | Admitting: *Deleted

## 2015-11-13 DIAGNOSIS — H53482 Generalized contraction of visual field, left eye: Secondary | ICD-10-CM | POA: Diagnosis not present

## 2015-11-19 ENCOUNTER — Emergency Department (HOSPITAL_COMMUNITY)
Admission: EM | Admit: 2015-11-19 | Discharge: 2015-11-19 | Disposition: A | Discharge status: Home / Self Care | Payer: Medicare Other | Attending: Emergency Medicine | Admitting: Emergency Medicine

## 2015-11-19 ENCOUNTER — Emergency Department (HOSPITAL_COMMUNITY): Payer: Medicare Other

## 2015-11-19 ENCOUNTER — Encounter (HOSPITAL_COMMUNITY): Payer: Self-pay | Admitting: Emergency Medicine

## 2015-11-19 DIAGNOSIS — R52 Pain, unspecified: Secondary | ICD-10-CM | POA: Diagnosis not present

## 2015-11-19 DIAGNOSIS — Z79899 Other long term (current) drug therapy: Secondary | ICD-10-CM | POA: Diagnosis not present

## 2015-11-19 DIAGNOSIS — H6690 Otitis media, unspecified, unspecified ear: Secondary | ICD-10-CM

## 2015-11-19 DIAGNOSIS — H81392 Other peripheral vertigo, left ear: Secondary | ICD-10-CM | POA: Insufficient documentation

## 2015-11-19 DIAGNOSIS — H6692 Otitis media, unspecified, left ear: Secondary | ICD-10-CM | POA: Insufficient documentation

## 2015-11-19 DIAGNOSIS — R51 Headache: Secondary | ICD-10-CM | POA: Insufficient documentation

## 2015-11-19 DIAGNOSIS — R519 Headache, unspecified: Secondary | ICD-10-CM

## 2015-11-19 DIAGNOSIS — G4489 Other headache syndrome: Secondary | ICD-10-CM | POA: Diagnosis not present

## 2015-11-19 HISTORY — DX: Migraine, unspecified, not intractable, without status migrainosus: G43.909

## 2015-11-19 LAB — URINALYSIS, ROUTINE W REFLEX MICROSCOPIC
BILIRUBIN URINE: NEGATIVE
GLUCOSE, UA: NEGATIVE mg/dL
Hgb urine dipstick: NEGATIVE
Ketones, ur: NEGATIVE mg/dL
NITRITE: NEGATIVE
Protein, ur: NEGATIVE mg/dL
SPECIFIC GRAVITY, URINE: 1.008 (ref 1.005–1.030)
pH: 6 (ref 5.0–8.0)

## 2015-11-19 LAB — COMPREHENSIVE METABOLIC PANEL
ALK PHOS: 68 U/L (ref 38–126)
ALT: 18 U/L (ref 14–54)
AST: 20 U/L (ref 15–41)
Albumin: 4.5 g/dL (ref 3.5–5.0)
Anion gap: 6 (ref 5–15)
BUN: 14 mg/dL (ref 6–20)
CO2: 26 mmol/L (ref 22–32)
CREATININE: 0.79 mg/dL (ref 0.44–1.00)
Calcium: 9.2 mg/dL (ref 8.9–10.3)
Chloride: 105 mmol/L (ref 101–111)
GFR calc non Af Amer: >60 mL/min (ref >60)
GLUCOSE: 107 mg/dL — AB (ref 65–99)
Potassium: 3.5 mmol/L (ref 3.5–5.1)
SODIUM: 137 mmol/L (ref 135–145)
Total Bilirubin: 0.6 mg/dL (ref 0.3–1.2)
Total Protein: 8.2 g/dL — ABNORMAL HIGH (ref 6.5–8.1)

## 2015-11-19 LAB — CBC WITH DIFFERENTIAL/PLATELET
BASOS PCT: 0 %
Basophils Absolute: 0 10*3/uL (ref 0.0–0.1)
Eosinophils Absolute: 0.1 10*3/uL (ref 0.0–0.7)
Eosinophils Relative: 1 %
HCT: 35.9 % — ABNORMAL LOW (ref 36.0–46.0)
HEMOGLOBIN: 11.9 g/dL — AB (ref 12.0–15.0)
Lymphocytes Relative: 24 %
Lymphs Abs: 3.2 10*3/uL (ref 0.7–4.0)
MCH: 28.3 pg (ref 26.0–34.0)
MCHC: 33.1 g/dL (ref 30.0–36.0)
MCV: 85.5 fL (ref 78.0–100.0)
Monocytes Absolute: 0.6 10*3/uL (ref 0.1–1.0)
Monocytes Relative: 5 %
NEUTROS PCT: 70 %
Neutro Abs: 9.2 10*3/uL — ABNORMAL HIGH (ref 1.7–7.7)
Platelets: 294 10*3/uL (ref 150–400)
RBC: 4.2 MIL/uL (ref 3.87–5.11)
RDW: 13.1 % (ref 11.5–15.5)
WBC: 13 10*3/uL — AB (ref 4.0–10.5)

## 2015-11-19 LAB — PREGNANCY, URINE: Preg Test, Ur: NEGATIVE

## 2015-11-19 LAB — URINE MICROSCOPIC-ADD ON
BACTERIA UA: NONE SEEN
RBC / HPF: NONE SEEN RBC/hpf (ref 0–5)

## 2015-11-19 MED ORDER — ONDANSETRON HCL 4 MG/2ML IJ SOLN
4.0000 mg | Freq: Once | INTRAMUSCULAR | Status: AC
  Administered 2015-11-19: 4 mg via INTRAVENOUS
  Filled 2015-11-19: qty 2

## 2015-11-19 MED ORDER — MECLIZINE HCL 25 MG PO TABS
25.0000 mg | ORAL_TABLET | Freq: Once | ORAL | Status: AC
  Administered 2015-11-19: 25 mg via ORAL
  Filled 2015-11-19: qty 1

## 2015-11-19 MED ORDER — SODIUM CHLORIDE 0.9 % IV BOLUS (SEPSIS)
1000.0000 mL | Freq: Once | INTRAVENOUS | Status: AC
  Administered 2015-11-19: 1000 mL via INTRAVENOUS

## 2015-11-19 MED ORDER — AZITHROMYCIN 250 MG PO TABS: ORAL_TABLET | ORAL | 0 refills | Status: DC

## 2015-11-19 MED ORDER — MECLIZINE HCL 25 MG PO TABS
25.0000 mg | ORAL_TABLET | Freq: Three times a day (TID) | ORAL | 0 refills | Status: DC | PRN
Start: 2015-11-19 — End: 2017-04-11

## 2015-11-19 NOTE — ED Notes (Signed)
Patient transported to CT. Will administer meds when pt returns.

## 2015-11-19 NOTE — ED Provider Notes (Signed)
[facility]-EMERGENCY DEPT Provider Note   CSN: [id number] Arrival date & time: 11/19/15  1712     History   Chief Complaint Chief Complaint  Patient presents with  . Headache    HPI [name] [name] is a 30 y.o. female.  HPI Patient has history of chronic headaches for which she takes Inderal and Topamax. Patient states that she developed left frontal headache 3-4 days ago. Gradual onset. Associated with spinning sensation, nausea and vomiting. Patient does endorse some photophobia and shaking chills. Denies fever. Admits to nasal congestion and mild cough. No focal weakness or visual changes. Past Medical History:  Diagnosis Date  . Depression    post partum  . 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 History of chlamydia   . Migraine     Patient Active Problem List   Diagnosis Date Noted  . Active labor 11/04/2013  . Normal delivery at term 11/04/2013  . Normal delivery 06/04/2011    Past Surgical History:  Procedure Laterality Date  . APPENDECTOMY      OB History    Gravida Para Term Preterm AB Living   4 4 4  0 0 4   SAB TAB Ectopic Multiple Live Births   0 0 0 0 4       Home Medications    Prior to Admission medications   Medication Sig Start Date End Date Taking? Authorizing Provider  azithromycin (ZITHROMAX Z-PAK) 250 MG tablet 2 po day one, then 1 daily x 4 days 11/19/15   Loren Raceravid Nasire Reali, MD  cephALEXin (KEFLEX) 500 MG capsule Take 1 capsule (500 mg total) by mouth 2 (two) times daily. Patient not taking: Reported on 11/19/2015 04/24/15   Tilden FossaElizabeth Rees, MD  cetirizine (ZYRTEC) 10 MG tablet Take 10 mg by mouth daily. 03/05/14   Historical Provider, MD  fluticasone (FLONASE) 50 MCG/ACT nasal spray Place 1 spray into both nostrils daily.  03/05/14   Historical Provider, MD  meclizine (ANTIVERT) 25 MG tablet Take 1 tablet (25 mg total) by mouth 3 (three) times daily as needed for dizziness or nausea. 11/19/15   Loren Raceravid Emileigh Kellett, MD    medroxyPROGESTERone (DEPO-PROVERA) 150 MG/ML injection Inject 150 mg into the muscle every 3 (three) months.  02/11/14   Historical Provider, MD  naproxen (NAPROSYN) 500 MG tablet Take 500 mg by mouth 2 (two) times daily.  03/05/14   Historical Provider, MD  ondansetron (ZOFRAN) 4 MG tablet Take 1 tablet (4 mg total) by mouth every 6 (six) hours. Patient not taking: Reported on 11/19/2015 04/24/15   Tilden FossaElizabeth Rees, MD  propranolol (INDERAL) 80 MG tablet Take 80 mg by mouth 2 (two) times daily. 03/05/14   Historical Provider, MD  topiramate (TOPAMAX) 25 MG tablet Take 25 mg by mouth 2 (two) times daily. 04/06/15   Historical Provider, MD    Family History Family History  Problem Relation Age of Onset  . Alcohol abuse Mother     Social History Social History  Substance Use Topics  . Smoking status: Never Smoker  . Smokeless tobacco: Never Used  . Alcohol use No     Allergies   Ibuprofen and Septra [sulfamethoxazole-trimethoprim]   Review of Systems Review of Systems  Constitutional: Positive for chills. Negative for fever.  HENT: Positive for congestion, ear pain and sinus pressure. Negative for hearing loss, tinnitus, trouble swallowing and voice change.   Eyes: Positive for photophobia. Negative for visual disturbance.  Respiratory: Positive for cough. Negative for shortness of  breath.   Cardiovascular: Negative for chest pain, palpitations and leg swelling.  Gastrointestinal: Positive for nausea and vomiting. Negative for abdominal pain, constipation and diarrhea.  Genitourinary: Negative for dysuria, flank pain and frequency.  Musculoskeletal: Negative for arthralgias, myalgias, neck pain and neck stiffness.  Skin: Negative for rash and wound.  Neurological: Positive for dizziness, syncope and headaches. Negative for light-headedness and numbness.  All other systems reviewed and are negative.    Physical Exam Updated Vital Signs BP 125/71 (BP Location: Right Arm)   Pulse  98   Temp 99 F (37.2 C) (Oral)   Resp 16   Ht 5\' 3"  (1.6 m)   Wt 130 lb (59 kg)   SpO2 100%   BMI 23.03 kg/m   Physical Exam  Constitutional: She is oriented to person, place, and time. She appears well-developed and well-nourished.  HENT:  Head: Normocephalic and atraumatic.  Mouth/Throat: Oropharynx is clear and moist.  Left greater than right nasal mucosal edema. Patient has bulging and erythema of the left TM. Tenderness to percussion over the left frontal sinus.  Eyes: EOM are normal. Pupils are equal, round, and reactive to light.  few beats of rotary nystagmus  Neck: Normal range of motion. Neck supple.  No meningismus  Cardiovascular: Normal rate and regular rhythm.  Exam reveals no gallop and no friction rub.   No murmur heard. Pulmonary/Chest: Effort normal and breath sounds normal. No respiratory distress. She has no wheezes. She has no rales. She exhibits no tenderness.  Abdominal: Soft. Bowel sounds are normal. There is no tenderness. There is no rebound and no guarding.  Musculoskeletal: Normal range of motion. She exhibits no edema or tenderness.  No lower extremity swelling, asymmetry or tenderness.  Neurological: She is alert and oriented to person, place, and time.  Patient is alert and oriented x3 with clear, goal oriented speech. Patient has 5/5 motor in all extremities. Sensation is intact to light touch. Bilateral finger-to-nose is normal with no signs of dysmetria.   Skin: Skin is warm and dry. No rash noted. No erythema.  Psychiatric: She has a normal mood and affect. Her behavior is normal.  Nursing note and vitals reviewed.    ED Treatments / Results  Labs (all labs ordered are listed, but only abnormal results are displayed) Labs Reviewed  CBC WITH DIFFERENTIAL/PLATELET - Abnormal; Notable for the following:       Result Value   WBC 13.0 (*)    Hemoglobin 11.9 (*)    HCT 35.9 (*)    Neutro Abs 9.2 (*)    All other components within normal limits   COMPREHENSIVE METABOLIC PANEL - Abnormal; Notable for the following:    Glucose, Bld 107 (*)    Total Protein 8.2 (*)    All other components within normal limits  URINALYSIS, ROUTINE W REFLEX MICROSCOPIC (NOT AT [facility]) - Abnormal; Notable for the following:    Leukocytes, UA TRACE (*)    All other components within normal limits  URINE MICROSCOPIC-ADD ON - Abnormal; Notable for the following:    Squamous Epithelial / LPF 0-5 (*)    All other components within normal limits  PREGNANCY, URINE    EKG  EKG Interpretation None       Radiology Ct Head Wo Contrast  Result Date: 11/19/2015 CLINICAL DATA:  Headache behind left eye EXAM: CT HEAD WITHOUT CONTRAST TECHNIQUE: Contiguous axial images were obtained from the base of the skull through the vertex without intravenous contrast. COMPARISON:  None.  FINDINGS: Brain: No evidence of acute infarction, hemorrhage, hydrocephalus, extra-axial collection or mass lesion/mass effect. Vascular: No hyperdense vessel or unexpected calcification. Skull: Normal. Negative for fracture or focal lesion. Sinuses/Orbits: Mucous retention cyst or polyp in the maxillary sinuses. Mucosal thickening within the ethmoid sinuses. Other: None IMPRESSION: No CT evidence for acute intracranial abnormality. Electronically Signed   By: Jasmine Pang M.D.   On: 11/19/2015 20:07    Procedures Procedures (including critical care time)  Medications Ordered in ED Medications  sodium chloride 0.9 % bolus 1,000 mL (0 mLs Intravenous Stopped 11/19/15 2103)  ondansetron (ZOFRAN) injection 4 mg (4 mg Intravenous Given 11/19/15 2000)  meclizine (ANTIVERT) tablet 25 mg (25 mg Oral Given 11/19/15 2000)     Initial Impression / Assessment and Plan / ED Course  I have reviewed the triage vital signs and the nursing notes.  Pertinent labs & imaging results that were available during my care of the patient were reviewed by me and considered in my medical decision making (see  chart for details).  Clinical Course  Patient is well-appearing. She has a normal neurologic exam. Likely has sinus disease and exacerbation of her chronic migraines as well as acute otitis media. We'll start on antibiotics. Return precautions given.  Final Clinical Impressions(s) / ED Diagnoses   Final diagnoses:  Sinus headache  Peripheral vertigo involving left ear  Acute otitis media, unspecified otitis media type    New Prescriptions New Prescriptions   AZITHROMYCIN (ZITHROMAX Z-PAK) 250 MG TABLET    2 po day one, then 1 daily x 4 days   MECLIZINE (ANTIVERT) 25 MG TABLET    Take 1 tablet (25 mg total) by mouth 3 (three) times daily as needed for dizziness or nausea.     Loren Racer, MD 11/19/15 2134

## 2015-11-19 NOTE — ED Triage Notes (Signed)
Pt comes from home with complaints of a headache located mainly behind the left eye.  Takes Topamax regularly for migraines but has had no relief. Denies N&V or other complaints besides her head. BP 114/80, HR 82, O2 99% RA

## 2016-01-13 ENCOUNTER — Emergency Department (HOSPITAL_COMMUNITY)
Admission: EM | Admit: 2016-01-13 | Discharge: 2016-01-14 | Disposition: A | Discharge status: Home / Self Care | Payer: Medicare Other | Attending: Emergency Medicine | Admitting: Emergency Medicine

## 2016-01-13 ENCOUNTER — Encounter (HOSPITAL_COMMUNITY): Payer: Self-pay | Admitting: Emergency Medicine

## 2016-01-13 DIAGNOSIS — R1084 Generalized abdominal pain: Secondary | ICD-10-CM | POA: Diagnosis not present

## 2016-01-13 DIAGNOSIS — R112 Nausea with vomiting, unspecified: Secondary | ICD-10-CM | POA: Insufficient documentation

## 2016-01-13 DIAGNOSIS — R197 Diarrhea, unspecified: Secondary | ICD-10-CM | POA: Insufficient documentation

## 2016-01-13 LAB — URINALYSIS, ROUTINE W REFLEX MICROSCOPIC
BACTERIA UA: NONE SEEN
BILIRUBIN URINE: NEGATIVE
GLUCOSE, UA: NEGATIVE mg/dL
Ketones, ur: 20 mg/dL — AB
NITRITE: NEGATIVE
PROTEIN: NEGATIVE mg/dL
Specific Gravity, Urine: 1.027 (ref 1.005–1.030)
pH: 5 (ref 5.0–8.0)

## 2016-01-13 LAB — COMPREHENSIVE METABOLIC PANEL
ALBUMIN: 4.5 g/dL (ref 3.5–5.0)
ALK PHOS: 85 U/L (ref 38–126)
ALT: 17 U/L (ref 14–54)
ANION GAP: 10 (ref 5–15)
AST: 20 U/L (ref 15–41)
BILIRUBIN TOTAL: 1 mg/dL (ref 0.3–1.2)
BUN: 9 mg/dL (ref 6–20)
CALCIUM: 9 mg/dL (ref 8.9–10.3)
CO2: 24 mmol/L (ref 22–32)
Chloride: 103 mmol/L (ref 101–111)
Creatinine, Ser: 0.8 mg/dL (ref 0.44–1.00)
Glucose, Bld: 96 mg/dL (ref 65–99)
POTASSIUM: 3.2 mmol/L — AB (ref 3.5–5.1)
Sodium: 137 mmol/L (ref 135–145)
TOTAL PROTEIN: 8.2 g/dL — AB (ref 6.5–8.1)

## 2016-01-13 LAB — CBC
HEMATOCRIT: 36.1 % (ref 36.0–46.0)
HEMOGLOBIN: 12.5 g/dL (ref 12.0–15.0)
MCH: 28.5 pg (ref 26.0–34.0)
MCHC: 34.6 g/dL (ref 30.0–36.0)
MCV: 82.2 fL (ref 78.0–100.0)
Platelets: 280 10*3/uL (ref 150–400)
RBC: 4.39 MIL/uL (ref 3.87–5.11)
RDW: 13 % (ref 11.5–15.5)
WBC: 11.5 10*3/uL — AB (ref 4.0–10.5)

## 2016-01-13 LAB — D-DIMER, QUANTITATIVE: D-Dimer, Quant: <0.27 ug/mL-FEU (ref 0.00–0.50)

## 2016-01-13 LAB — I-STAT BETA HCG BLOOD, ED (MC, WL, AP ONLY): I-stat hCG, quantitative: <5 m[IU]/mL (ref <5)

## 2016-01-13 LAB — LIPASE, BLOOD: Lipase: 19 U/L (ref 11–51)

## 2016-01-13 LAB — POC OCCULT BLOOD, ED: FECAL OCCULT BLD: NEGATIVE

## 2016-01-13 MED ORDER — SODIUM CHLORIDE 0.9 % IV BOLUS (SEPSIS)
1000.0000 mL | Freq: Once | INTRAVENOUS | Status: AC
  Administered 2016-01-13: 1000 mL via INTRAVENOUS

## 2016-01-13 MED ORDER — ONDANSETRON HCL 4 MG/2ML IJ SOLN
4.0000 mg | Freq: Once | INTRAMUSCULAR | Status: AC
  Administered 2016-01-13: 4 mg via INTRAVENOUS
  Filled 2016-01-13: qty 2

## 2016-01-13 MED ORDER — HYDROMORPHONE HCL 2 MG/ML IJ SOLN
0.5000 mg | Freq: Once | INTRAMUSCULAR | Status: AC
  Administered 2016-01-13: 0.5 mg via INTRAVENOUS
  Filled 2016-01-13: qty 1

## 2016-01-13 MED ORDER — ONDANSETRON HCL 4 MG/2ML IJ SOLN
4.0000 mg | Freq: Once | INTRAMUSCULAR | Status: AC
  Administered 2016-01-14: 4 mg via INTRAVENOUS
  Filled 2016-01-13: qty 2

## 2016-01-13 MED ORDER — POTASSIUM CHLORIDE CRYS ER 20 MEQ PO TBCR
30.0000 meq | EXTENDED_RELEASE_TABLET | Freq: Once | ORAL | Status: AC
  Administered 2016-01-13: 23:00:00 30 meq via ORAL
  Filled 2016-01-13: qty 1

## 2016-01-13 NOTE — ED Triage Notes (Signed)
Patient reports abdominal pain, hematuria, and n/v/d starting last night.

## 2016-01-13 NOTE — ED Provider Notes (Signed)
[facility]-EMERGENCY DEPT Provider Note   CSN: [id number] Arrival date & time: 01/13/16  1717  By signing my name below, I, Valentino SaxonBianca Contreras, attest that this documentation has been prepared under the direction and in the presence of Arvilla MeresAshley Meyer PA-C Electronically Signed: Valentino SaxonBianca Contreras, ED Scribe. 01/13/16. 9:11 PM.  History   Chief Complaint Chief Complaint  Patient presents with  . Emesis  . Abdominal Pain   The history is provided by the patient. No language interpreter was used.  Abdominal Pain   Associated symptoms include diarrhea, vomiting and myalgias. Pertinent negatives include fever, dysuria, hematuria and headaches.   HPI Comments: [name] [name] is a 30 y.o. female who presents to the Emergency Department complaining of moderate, episodic, vomiting onset this morning ~2am. Pt reports associated episodic vomiting and diarrhea followed by abdominal pain. Pt states she thinks she may have a stomach virus. She notes both of her younger children have been vomiting this past week. Pt notes having about 10 episodes of "brownish-yellowish" vomiting accompanied by dry heaving. Pt also states having ~20 episodes of diarrhea. She notes there was blood in her stool. She describes the blood being bright red and later on became a "reddish-brownish" color. Pt's spouse notes Pt has not had meal today but has been drinking small amounts of water. No alleviating factors noted. Pt also complains of left lower extremity myalgias. Pt denies trauma to her left leg. She denies CP, SOB, fever, chills, sweats, vaginal discharge, vaginal bleeding, hematuria, dysuria, sore throat, cough. She denies recent travel outside of the [place] and traveling long distances. Pt also denies recent antibiotic use and blood thinners use. Pt notes she is currently on oral contraceptives for about three weeks. She also notes PMHx of appendectomy.   Past Medical History:  Diagnosis Date  . Depression    post partum  . 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 History of chlamydia   . Migraine     Patient Active Problem List   Diagnosis Date Noted  . Active labor 11/04/2013  . Normal delivery at term 11/04/2013  . Normal delivery 06/04/2011    Past Surgical History:  Procedure Laterality Date  . APPENDECTOMY      OB History    Gravida Para Term Preterm AB Living   4 4 4  0 0 4   SAB TAB Ectopic Multiple Live Births   0 0 0 0 4       Home Medications    Prior to Admission medications   Medication Sig Start Date End Date Taking? Authorizing Provider  fluticasone (FLONASE) 50 MCG/ACT nasal spray Place 1 spray into both nostrils daily.  03/05/14  Yes Historical Provider, MD  ondansetron (ZOFRAN) 4 MG tablet Take 1 tablet (4 mg total) by mouth every 6 (six) hours. 04/24/15  Yes Tilden FossaElizabeth Rees, MD  azithromycin (ZITHROMAX Z-PAK) 250 MG tablet 2 po day one, then 1 daily x 4 days Patient not taking: Reported on 01/13/2016 11/19/15   Loren Raceravid Yelverton, MD  cephALEXin (KEFLEX) 500 MG capsule Take 1 capsule (500 mg total) by mouth 2 (two) times daily. Patient not taking: Reported on 01/13/2016 04/24/15   Tilden FossaElizabeth Rees, MD  meclizine (ANTIVERT) 25 MG tablet Take 1 tablet (25 mg total) by mouth 3 (three) times daily as needed for dizziness or nausea. Patient not taking: Reported on 01/13/2016 11/19/15   Loren Raceravid Yelverton, MD  ondansetron (ZOFRAN ODT) 4 MG disintegrating tablet Take 1 tablet (4 mg total)  by mouth every 8 (eight) hours as needed for nausea or vomiting. 01/14/16   Lona Kettle, PA-C    Family History Family History  Problem Relation Age of Onset  . Alcohol abuse Mother     Social History Social History  Substance Use Topics  . Smoking status: Never Smoker  . Smokeless tobacco: Never Used  . Alcohol use No     Allergies   Ibuprofen and Septra [sulfamethoxazole-trimethoprim]   Review of Systems Review of Systems  Constitutional: Negative for chills and  fever.  HENT: Negative for sore throat.   Eyes: Negative for visual disturbance.  Respiratory: Negative for cough and shortness of breath.   Cardiovascular: Negative for chest pain.  Gastrointestinal: Positive for abdominal pain, blood in stool, diarrhea and vomiting.  Genitourinary: Negative for dysuria, hematuria, pelvic pain, vaginal bleeding, vaginal discharge and vaginal pain.  Musculoskeletal: Positive for myalgias.  Skin: Negative for rash.  Neurological: Negative for syncope and headaches.     Physical Exam Updated Vital Signs BP 110/60 (BP Location: Right Arm)   Pulse 85   Temp 98.4 F (36.9 C) (Oral)   Resp 16   Ht 5\' 3"  (1.6 m)   Wt 56.7 kg   SpO2 97%   BMI 22.14 kg/m   Physical Exam  Constitutional: She appears well-developed and well-nourished. No distress.  HENT:  Head: Normocephalic and atraumatic.  Mouth/Throat: Oropharynx is clear and moist. No oropharyngeal exudate.  Eyes: Conjunctivae and EOM are normal. Pupils are equal, round, and reactive to light. Right eye exhibits no discharge. Left eye exhibits no discharge. No scleral icterus.  Neck: Normal range of motion. Neck supple. No JVD present. No thyromegaly present.  Cardiovascular: Normal rate, regular rhythm, normal heart sounds and intact distal pulses.  Exam reveals no gallop and no friction rub.   No murmur heard. Pulmonary/Chest: Effort normal and breath sounds normal. No respiratory distress. She has no wheezes. She has no rales.  Abdominal: Soft. Normal appearance and bowel sounds are normal. She exhibits no distension and no mass. There is no tenderness.  No abdominal tenderness to palpation.   Musculoskeletal: Normal range of motion. She exhibits no edema or tenderness.  No b/l lower extremity swelling. No tenderness to posterior calf.   Lymphadenopathy:    She has no cervical adenopathy.  Neurological: She is alert. Coordination normal.  Skin: Skin is warm and dry. No rash noted. No erythema.    Psychiatric: She has a normal mood and affect. Her behavior is normal.  Nursing note and vitals reviewed.    ED Treatments / Results   DIAGNOSTIC STUDIES: Oxygen Saturation is 99% on RA, normal by my interpretation.    COORDINATION OF CARE: 9:04 PM Discussed treatment plan with pt at bedside which includes labs and nausea medicine and pt agreed to plan.  Labs (all labs ordered are listed, but only abnormal results are displayed) Labs Reviewed  COMPREHENSIVE METABOLIC PANEL - Abnormal; Notable for the following:       Result Value   Potassium 3.2 (*)    Total Protein 8.2 (*)    All other components within normal limits  CBC - Abnormal; Notable for the following:    WBC 11.5 (*)    All other components within normal limits  URINALYSIS, ROUTINE W REFLEX MICROSCOPIC - Abnormal; Notable for the following:    APPearance HAZY (*)    Hgb urine dipstick SMALL (*)    Ketones, ur 20 (*)    Leukocytes, UA TRACE (*)  Squamous Epithelial / LPF 0-5 (*)    All other components within normal limits  LIPASE, BLOOD  D-DIMER, QUANTITATIVE (NOT AT [facility])  I-STAT BETA HCG BLOOD, ED ([facility], [facility], [facility] ONLY)  POC OCCULT BLOOD, ED    EKG  EKG Interpretation None       Radiology No results found.  Procedures Procedures (including critical care time)  Medications Ordered in ED Medications  sodium chloride 0.9 % bolus 1,000 mL (0 mLs Intravenous Stopped 01/13/16 2202)  ondansetron (ZOFRAN) injection 4 mg (4 mg Intravenous Given 01/13/16 2118)  sodium chloride 0.9 % bolus 1,000 mL (0 mLs Intravenous Stopped 01/14/16 0002)  HYDROmorphone (DILAUDID) injection 0.5 mg (0.5 mg Intravenous Given 01/13/16 2251)  potassium chloride (K-DUR,KLOR-CON) CR tablet 30 mEq (30 mEq Oral Given 01/13/16 2255)  ondansetron (ZOFRAN) injection 4 mg (4 mg Intravenous Given 01/14/16 0000)     Initial Impression / Assessment and Plan / ED Course  I have reviewed the triage vital signs and the nursing  notes.  Pertinent labs & imaging results that were available during my care of the patient were reviewed by me and considered in my medical decision making (see chart for details).  Clinical Course as of Jan 15 2242  Wed Jan 13, 2016  2249 On re-evaluation patient endorses improvement. Abdominal exam benign. Pt requesting food and beverage. Will PO challenge.   [AM]    Clinical Course User Index [AM] Lona KettleAshley Laurel Meyer, PA-C    Patient presents to ED with complaint of N/V/D and abdominal pain onset today. Patient is afebrile and non-toxic appearing in NAD. VSS. Heart RRR. Lungs CTABL. Abdomen is benign - soft, non-tender, non-distended. No lower extremity swelling. No posterior calf tenderness. Will give IVF, pain medication, and anti-emetics. Will check basic labs. Given lower leg pain without injury will check d-dimer.   Pregnancy negative - doubt ectopic or sxs secondary to pregnancy. U/A shows trace leukocytes, but squamous epithelial also present, asx - ?contaminated specimen. Lipase nml - doubt pancreatitis. LFTs and total bili nml, afebrile, and negative murphy's sign - low suspicion for acute cholecystitis at this time. S/p appendectomy. Negative hemoccult. No anemia. Mild leukocytosis - ?secondary to current illness. Considering soft and non-tender abdomen low suspicion for acute abdomen at this time. Suspect viral illness - known sick contacts with similar sxs.   Discussed results and plan with pt. Endorses improvement following treatment. Abdomen remained benign on repeat exam. Tolerating PO fluids. Rx zofran for nausea. Encouraged hydration. Follow up with PCP. Strict return precautions given. Pt voiced understanding and is agreeable.   Final Clinical Impressions(s) / ED Diagnoses   Final diagnoses:  Nausea vomiting and diarrhea  Generalized abdominal pain    New Prescriptions Discharge Medication List as of 01/14/2016  1:10 AM    START taking these medications   Details   ondansetron (ZOFRAN ODT) 4 MG disintegrating tablet Take 1 tablet (4 mg total) by mouth every 8 (eight) hours as needed for nausea or vomiting., Starting Thu 01/14/2016, Print        I personally performed the services described in this documentation, which was scribed in my presence. The recorded information has been reviewed and is accurate.     Lona KettleAshley Laurel Meyer, PA-C 01/16/16 2249    Melene Planan Floyd, DO 01/19/16 0930

## 2016-01-14 DIAGNOSIS — R112 Nausea with vomiting, unspecified: Secondary | ICD-10-CM | POA: Diagnosis not present

## 2016-01-14 MED ORDER — ONDANSETRON 4 MG PO TBDP: 4.0000 mg | ORAL_TABLET | Freq: Three times a day (TID) | ORAL | 0 refills | Status: DC | PRN

## 2016-01-14 NOTE — ED Notes (Signed)
Patient was alert, oriented and stable upon discharge. RN went over AVS and patient had no further questions.  

## 2016-01-14 NOTE — Discharge Instructions (Signed)
Read the information below.  This may be a viral illness. It is important that you stay well hydrated, get plenty of rest, and wash hands regularly.  I have prescribed zofran. You can take tylenol for pain relief.  Follow up with your primary provider within one week for re-evaluation.  Use the prescribed medication as directed.  Please discuss all new medications with your pharmacist.   You may return to the Emergency Department at any time for worsening condition or any new symptoms that concern you. Return to ED if you develop fever, constant, localized abdominal pain, blood in stool, blood in urine, unable to keep food/fluids down, or any other new/concerning symptoms.

## 2016-02-09 DIAGNOSIS — Z01419 Encounter for gynecological examination (general) (routine) without abnormal findings: Secondary | ICD-10-CM | POA: Diagnosis not present

## 2016-02-09 DIAGNOSIS — Z3200 Encounter for pregnancy test, result unknown: Secondary | ICD-10-CM | POA: Diagnosis not present

## 2016-02-09 DIAGNOSIS — B349 Viral infection, unspecified: Secondary | ICD-10-CM | POA: Diagnosis not present

## 2016-02-09 DIAGNOSIS — Z3042 Encounter for surveillance of injectable contraceptive: Secondary | ICD-10-CM | POA: Diagnosis not present

## 2016-02-09 DIAGNOSIS — R87610 Atypical squamous cells of undetermined significance on cytologic smear of cervix (ASC-US): Secondary | ICD-10-CM | POA: Diagnosis not present

## 2016-02-12 ENCOUNTER — Emergency Department (HOSPITAL_COMMUNITY)
Admission: EM | Admit: 2016-02-12 | Discharge: 2016-02-12 | Disposition: A | Discharge status: Home / Self Care | Payer: Medicare Other | Attending: Emergency Medicine | Admitting: Emergency Medicine

## 2016-02-12 ENCOUNTER — Emergency Department (HOSPITAL_COMMUNITY): Payer: Medicare Other

## 2016-02-12 ENCOUNTER — Encounter (HOSPITAL_COMMUNITY): Payer: Self-pay

## 2016-02-12 DIAGNOSIS — R05 Cough: Secondary | ICD-10-CM | POA: Diagnosis not present

## 2016-02-12 DIAGNOSIS — R0789 Other chest pain: Secondary | ICD-10-CM | POA: Insufficient documentation

## 2016-02-12 DIAGNOSIS — N3000 Acute cystitis without hematuria: Secondary | ICD-10-CM | POA: Insufficient documentation

## 2016-02-12 DIAGNOSIS — B9689 Other specified bacterial agents as the cause of diseases classified elsewhere: Secondary | ICD-10-CM | POA: Diagnosis not present

## 2016-02-12 DIAGNOSIS — B349 Viral infection, unspecified: Secondary | ICD-10-CM | POA: Diagnosis not present

## 2016-02-12 DIAGNOSIS — Z79899 Other long term (current) drug therapy: Secondary | ICD-10-CM | POA: Diagnosis not present

## 2016-02-12 DIAGNOSIS — R079 Chest pain, unspecified: Secondary | ICD-10-CM | POA: Diagnosis not present

## 2016-02-12 DIAGNOSIS — J069 Acute upper respiratory infection, unspecified: Secondary | ICD-10-CM | POA: Diagnosis not present

## 2016-02-12 DIAGNOSIS — R000 Tachycardia, unspecified: Secondary | ICD-10-CM | POA: Diagnosis not present

## 2016-02-12 LAB — URINALYSIS, ROUTINE W REFLEX MICROSCOPIC
Bilirubin Urine: NEGATIVE
Glucose, UA: NEGATIVE mg/dL
KETONES UR: 20 mg/dL — AB
NITRITE: NEGATIVE
PROTEIN: 100 mg/dL — AB
Specific Gravity, Urine: 1.028 (ref 1.005–1.030)
pH: 5 (ref 5.0–8.0)

## 2016-02-12 LAB — CBC WITH DIFFERENTIAL/PLATELET
BASOS ABS: 0 10*3/uL (ref 0.0–0.1)
BASOS PCT: 0 %
EOS ABS: 0 10*3/uL (ref 0.0–0.7)
Eosinophils Relative: 0 %
HCT: 38.3 % (ref 36.0–46.0)
HEMOGLOBIN: 12.7 g/dL (ref 12.0–15.0)
Lymphocytes Relative: 30 %
Lymphs Abs: 1.9 10*3/uL (ref 0.7–4.0)
MCH: 27.9 pg (ref 26.0–34.0)
MCHC: 33.2 g/dL (ref 30.0–36.0)
MCV: 84 fL (ref 78.0–100.0)
Monocytes Absolute: 0.5 10*3/uL (ref 0.1–1.0)
Monocytes Relative: 7 %
NEUTROS PCT: 63 %
Neutro Abs: 3.8 10*3/uL (ref 1.7–7.7)
Platelets: 216 10*3/uL (ref 150–400)
RBC: 4.56 MIL/uL (ref 3.87–5.11)
RDW: 13 % (ref 11.5–15.5)
WBC: 6.1 10*3/uL (ref 4.0–10.5)

## 2016-02-12 LAB — COMPREHENSIVE METABOLIC PANEL
ALBUMIN: 3.9 g/dL (ref 3.5–5.0)
ALK PHOS: 53 U/L (ref 38–126)
ALT: 19 U/L (ref 14–54)
AST: 31 U/L (ref 15–41)
Anion gap: 10 (ref 5–15)
BUN: 7 mg/dL (ref 6–20)
CALCIUM: 8.9 mg/dL (ref 8.9–10.3)
CO2: 25 mmol/L (ref 22–32)
CREATININE: 1 mg/dL (ref 0.44–1.00)
Chloride: 103 mmol/L (ref 101–111)
GFR calc Af Amer: >60 mL/min (ref >60)
GFR calc non Af Amer: >60 mL/min (ref >60)
GLUCOSE: 74 mg/dL (ref 65–99)
Potassium: 3.4 mmol/L — ABNORMAL LOW (ref 3.5–5.1)
Sodium: 138 mmol/L (ref 135–145)
Total Bilirubin: 0.7 mg/dL (ref 0.3–1.2)
Total Protein: 7.2 g/dL (ref 6.5–8.1)

## 2016-02-12 LAB — I-STAT CG4 LACTIC ACID, ED
Lactic Acid, Venous: 0.86 mmol/L (ref 0.5–1.9)
Lactic Acid, Venous: 0.66 mmol/L (ref 0.5–1.9)

## 2016-02-12 LAB — I-STAT TROPONIN, ED
TROPONIN I, POC: 0 ng/mL (ref 0.00–0.08)
Troponin i, poc: 0 ng/mL (ref 0.00–0.08)

## 2016-02-12 LAB — D-DIMER, QUANTITATIVE: D-Dimer, Quant: 0.5 ug/mL-FEU (ref 0.00–0.50)

## 2016-02-12 MED ORDER — CEPHALEXIN 500 MG PO CAPS: 500.0000 mg | ORAL_CAPSULE | Freq: Two times a day (BID) | ORAL | 0 refills | Status: AC

## 2016-02-12 MED ORDER — ONDANSETRON HCL 4 MG PO TABS: 4.0000 mg | ORAL_TABLET | Freq: Three times a day (TID) | ORAL | 0 refills | Status: DC | PRN

## 2016-02-12 MED ORDER — CEPHALEXIN 250 MG PO CAPS
500.0000 mg | ORAL_CAPSULE | Freq: Once | ORAL | Status: AC
  Administered 2016-02-12: 500 mg via ORAL
  Filled 2016-02-12: qty 2

## 2016-02-12 MED ORDER — SODIUM CHLORIDE 0.9 % IV BOLUS (SEPSIS)
1000.0000 mL | Freq: Once | INTRAVENOUS | Status: AC
  Administered 2016-02-12: 1000 mL via INTRAVENOUS

## 2016-02-12 MED ORDER — HYDROCODONE-ACETAMINOPHEN 5-325 MG PO TABS: 1.0000 | ORAL_TABLET | ORAL | 0 refills | Status: DC | PRN

## 2016-02-12 NOTE — Discharge Instructions (Signed)
Please take your antibiotics twice a day for 1 week to help with your urinary tract infection. Please take pain medicine and nausea medicine as needed to help control your chest wall pain and your nausea. If symptoms worsen, please return to the nearest emergency department. Please follow-up with a primary care physician in several days for further management of your symptoms.

## 2016-02-12 NOTE — ED Notes (Signed)
Patient transported to X-ray 

## 2016-02-12 NOTE — ED Provider Notes (Signed)
[facility]-EMERGENCY DEPT Provider Note   CSN: [id number] Arrival date & time: 02/12/16  0757     History   Chief Complaint Chief Complaint  Patient presents with  . Influenza    HPI [name] [name] is a 31 y.o. female With a past medical history Significant for depression, migraines, and reported diagnosis of influenza who presents with rhinorrhea, cough, congestion, myalgias, decreased PO intake, and darkened  Urine. Patient reports that she has been feeling bad for several days And presented to the health department where She was told she had The flu. Patient was started on Tamiflu And has not experienced any relief. She says that she has not eaten or had any fluids since yesterday. She denies nausea, vomiting, constipation, diarrhea. She reports feeling dehydrated. She reports a constant cough with some chest Discomfort during episodes. The chest pain is sharp and in the Middle of her chest. She denies radiation of the pain. She says it is pleuritic. She denies a exertional component. She reports sick contacts with her children.    HPI  Past Medical History:  Diagnosis Date  . Depression    post partum  . 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 History of chlamydia   . Migraine     Patient Active Problem List   Diagnosis Date Noted  . Active labor 11/04/2013  . Normal delivery at term 11/04/2013  . Normal delivery 06/04/2011    Past Surgical History:  Procedure Laterality Date  . APPENDECTOMY      OB History    Gravida Para Term Preterm AB Living   4 4 4  0 0 4   SAB TAB Ectopic Multiple Live Births   0 0 0 0 4       Home Medications    Prior to Admission medications   Medication Sig Start Date End Date Taking? Authorizing Provider  azithromycin (ZITHROMAX Z-PAK) 250 MG tablet 2 po day one, then 1 daily x 4 days Patient not taking: Reported on 01/13/2016 11/19/15   Loren Racer, MD  cephALEXin (KEFLEX) 500 MG capsule Take 1 capsule  (500 mg total) by mouth 2 (two) times daily. Patient not taking: Reported on 01/13/2016 04/24/15   Tilden Fossa, MD  fluticasone [facility]) 50 MCG/ACT nasal spray Place 1 spray into both nostrils daily.  03/05/14   Historical Provider, MD  meclizine (ANTIVERT) 25 MG tablet Take 1 tablet (25 mg total) by mouth 3 (three) times daily as needed for dizziness or nausea. Patient not taking: Reported on 01/13/2016 11/19/15   Loren Racer, MD  ondansetron (ZOFRAN ODT) 4 MG disintegrating tablet Take 1 tablet (4 mg total) by mouth every 8 (eight) hours as needed for nausea or vomiting. 01/14/16   Lona Kettle, PA-C  ondansetron (ZOFRAN) 4 MG tablet Take 1 tablet (4 mg total) by mouth every 6 (six) hours. 04/24/15   Tilden Fossa, MD    Family History Family History  Problem Relation Age of Onset  . Alcohol abuse Mother     Social History Social History  Substance Use Topics  . Smoking status: Never Smoker  . Smokeless tobacco: Never Used  . Alcohol use No     Allergies   Ibuprofen and Septra [sulfamethoxazole-trimethoprim]   Review of Systems Review of Systems  Constitutional: Positive for appetite change, chills, fatigue and fever. Negative for diaphoresis.  HENT: Positive for congestion and rhinorrhea.   Respiratory: Positive for cough. Negative for chest tightness, shortness of breath, wheezing  and stridor.   Cardiovascular: Positive for chest pain. Negative for palpitations and leg swelling.  Gastrointestinal: Negative for abdominal pain, constipation, diarrhea, nausea and vomiting.  Genitourinary: Negative for decreased urine volume and dysuria.  Musculoskeletal: Negative for back pain.  Skin: Negative for rash and wound.  Neurological: Negative for syncope, weakness and headaches.  All other systems reviewed and are negative.    Physical Exam Updated Vital Signs BP 116/68 (BP Location: Right Arm)   Pulse 104   Temp 99.5 F (37.5 C) (Oral)   Resp 14   Ht 5\' 3"   (1.6 m)   Wt 132 lb (59.9 kg)   LMP 01/31/2016   SpO2 99%   BMI 23.38 kg/m   Physical Exam  Constitutional: She is oriented to person, place, and time. She appears well-developed and well-nourished. No distress.  HENT:  Head: Normocephalic and atraumatic.  Right Ear: External ear normal.  Left Ear: External ear normal.  Nose: Rhinorrhea present.  Mouth/Throat: Oropharynx is clear and moist. No oropharyngeal exudate.  Eyes: Conjunctivae and EOM are normal. Pupils are equal, round, and reactive to light.  Neck: Normal range of motion. Neck supple.  Cardiovascular: Regular rhythm and normal heart sounds.  Tachycardia present.  Exam reveals no friction rub.   Pulmonary/Chest: Effort normal and breath sounds normal. No stridor. No respiratory distress. She has no wheezes. She has no rales. She exhibits no tenderness.  Abdominal: She exhibits no distension. There is no tenderness. There is no rebound.  Musculoskeletal: She exhibits no tenderness.  Neurological: She is alert and oriented to person, place, and time. She has normal reflexes. She exhibits normal muscle tone. Coordination normal.  Skin: Skin is warm. No rash noted. She is not diaphoretic. No erythema.  Nursing note and vitals reviewed.    ED Treatments / Results  Labs (all labs ordered are listed, but only abnormal results are displayed) Labs Reviewed  COMPREHENSIVE METABOLIC PANEL - Abnormal; Notable for the following:       Result Value   Potassium 3.4 (*)    All other components within normal limits  URINALYSIS, ROUTINE W REFLEX MICROSCOPIC - Abnormal; Notable for the following:    Color, Urine AMBER (*)    APPearance TURBID (*)    Hgb urine dipstick MODERATE (*)    Ketones, ur 20 (*)    Protein, ur 100 (*)    Leukocytes, UA MODERATE (*)    Bacteria, UA FEW (*)    Squamous Epithelial / LPF 6-30 (*)    All other components within normal limits  CBC WITH DIFFERENTIAL/PLATELET  D-DIMER, QUANTITATIVE (NOT AT [facility])    I-STAT CG4 LACTIC ACID, ED  POC URINE PREG, ED  I-STAT TROPOININ, ED  I-STAT CG4 LACTIC ACID, ED  I-STAT TROPOININ, ED    EKG  EKG Interpretation  Date/Time:  Friday February 12 2016 09:36:12 EST Ventricular Rate:  104 PR Interval:    QRS Duration: 75 QT Interval:  336 QTC Calculation: 442 R Axis:   99 Text Interpretation:  Sinus tachycardia Borderline right axis deviation Nonspecific T abnormalities, inferior leads No prior ECG for comparison Possible S1, T3 No STEMI Confirmed by Rush Landmark MD, Kamren Heskett [phone]) on 02/12/2016 5:13:24 PM       Radiology Dg Chest 2 View  Result Date: 02/12/2016 CLINICAL DATA:  Chest pain.  Cough.  Tachycardia. EXAM: CHEST  2 VIEW COMPARISON:  02/23/2011 chest radiograph. FINDINGS: Stable cardiomediastinal silhouette with normal heart size. No pneumothorax. No pleural effusion. Lungs appear clear, with  no acute consolidative airspace disease and no pulmonary edema. IMPRESSION: No active cardiopulmonary disease. Electronically Signed   By: Delbert PhenixJason A Poff M.D.   On: 02/12/2016 10:07    Procedures Procedures (including critical care time)  Medications Ordered in ED Medications  sodium chloride 0.9 % bolus 1,000 mL (0 mLs Intravenous Stopped 02/12/16 1139)  sodium chloride 0.9 % bolus 1,000 mL (0 mLs Intravenous Stopped 02/12/16 1238)  cephALEXin (KEFLEX) capsule 500 mg (500 mg Oral Given 02/12/16 1301)     Initial Impression / Assessment and Plan / ED Course  I have reviewed the triage vital signs and the nursing notes.  Pertinent labs & imaging results that were available during my care of the patient were reviewed by me and considered in my medical decision making (see chart for details).    [name] [name] is a 31 y.o. female With a past medical history Significant for depression, migraines, and reported diagnosis of influenza who presents with rhinorrhea, cough, congestion, myalgias, decreased PO intake, and darkened  Urine.   History and  exam are seen above. Patient found to have clear lungs. Congestion and rhinorrhea are present. Anterior chest wall is tender to  Palpation and her chest discomfort is reproducible. Abdomen is nontender. No stridor. No neck stiffness. No rashes.  Given duration of symptoms, no flu test was ordered. Suspect a viral upper respiratory  Chest x-ray was ordered showing no Pneumonia. Other lab testing revealed A urinary tract infection. D dimer was negative. Patient given dose of keflex as well as a prescription for Keflex. Patient given prescription for pain due to suspected chest wall pain.  Patient given return precautions for signs and symptoms worsening. Patient had improvement in heart rate after  Fluids. Patient able to tolerate PO. Patient felt stable for discharge and Understood instructions for PCP follow-up And return precautions. Patient discharged in good condition.   Final Clinical Impressions(s) / ED Diagnoses   Final diagnoses:  Viral syndrome  Upper respiratory tract infection, unspecified type  Acute cystitis without hematuria  Chest pain, unspecified type    New Prescriptions Discharge Medication List as of 02/12/2016 12:58 PM    START taking these medications   Details  !! cephALEXin (KEFLEX) 500 MG capsule Take 1 capsule (500 mg total) by mouth 2 (two) times daily., Starting Fri 02/12/2016, Until Fri 02/19/2016, Print    HYDROcodone-acetaminophen (NORCO/VICODIN) 5-325 MG tablet Take 1 tablet by mouth every 4 (four) hours as needed., Starting Fri 02/12/2016, Print    !! ondansetron (ZOFRAN) 4 MG tablet Take 1 tablet (4 mg total) by mouth every 8 (eight) hours as needed for nausea or vomiting., Starting Fri 02/12/2016, Print     !! - Potential duplicate medications found. Please discuss with provider.     Clinical Impression: 1. Viral syndrome   2. Upper respiratory tract infection, unspecified type   3. Acute cystitis without hematuria   4. Chest pain, unspecified type      Disposition: Discharge  Condition: Good  I have discussed the results, Dx and Tx plan with the pt(& family if present). He/she/they expressed understanding and agree(s) with the plan. Discharge instructions discussed at great length. Strict return precautions discussed and pt &/or family have verbalized understanding of the instructions. No further questions at time of discharge.    Discharge Medication List as of 02/12/2016 12:58 PM    START taking these medications   Details  !! cephALEXin (KEFLEX) 500 MG capsule Take 1 capsule (500 mg total) by mouth  2 (two) times daily., Starting Fri 02/12/2016, Until Fri 02/19/2016, Print    HYDROcodone-acetaminophen (NORCO/VICODIN) 5-325 MG tablet Take 1 tablet by mouth every 4 (four) hours as needed., Starting Fri 02/12/2016, Print    !! ondansetron (ZOFRAN) 4 MG tablet Take 1 tablet (4 mg total) by mouth every 8 (eight) hours as needed for nausea or vomiting., Starting Fri 02/12/2016, Print     !! - Potential duplicate medications found. Please discuss with provider.      Follow Up: Gilda Crease, MD [address] [place] [address] [phone]     [facility] EMERGENCY DEPARTMENT [address] [id number] mc [place] [address] [phone]  If symptoms worsen     Heide Scales, MD 02/12/16 2014

## 2016-02-12 NOTE — ED Triage Notes (Signed)
Pt. Coming from home via [facility] for influenza. Pt. Dx 2 days ago with flu and was given tamiflu. Pt. sts she does not feel any better and needs to be evaluated. Pt. texting on phone during triage. Pt. HR sinus tach at 124 bpm.

## 2016-02-12 NOTE — ED Notes (Signed)
Pt. Ambulatory to restroom with steady gait.  

## 2016-02-12 NOTE — ED Notes (Signed)
Pt returned to room from imaging dept.  

## 2016-02-12 NOTE — ED Notes (Signed)
EDP at bedside  

## 2016-04-21 DIAGNOSIS — F330 Major depressive disorder, recurrent, mild: Secondary | ICD-10-CM | POA: Diagnosis not present

## 2016-05-17 DIAGNOSIS — Z113 Encounter for screening for infections with a predominantly sexual mode of transmission: Secondary | ICD-10-CM | POA: Diagnosis not present

## 2016-05-17 DIAGNOSIS — B373 Candidiasis of vulva and vagina: Secondary | ICD-10-CM | POA: Diagnosis not present

## 2016-05-17 DIAGNOSIS — Z3200 Encounter for pregnancy test, result unknown: Secondary | ICD-10-CM | POA: Diagnosis not present

## 2016-05-17 DIAGNOSIS — Z30017 Encounter for initial prescription of implantable subdermal contraceptive: Secondary | ICD-10-CM | POA: Diagnosis not present

## 2016-07-21 DIAGNOSIS — H01029 Squamous blepharitis unspecified eye, unspecified eyelid: Secondary | ICD-10-CM | POA: Diagnosis not present

## 2016-07-21 DIAGNOSIS — R51 Headache: Secondary | ICD-10-CM | POA: Diagnosis not present

## 2016-07-21 DIAGNOSIS — Q100 Congenital ptosis: Secondary | ICD-10-CM | POA: Diagnosis not present

## 2016-11-01 DIAGNOSIS — Z3046 Encounter for surveillance of implantable subdermal contraceptive: Secondary | ICD-10-CM | POA: Diagnosis not present

## 2016-11-01 DIAGNOSIS — Z113 Encounter for screening for infections with a predominantly sexual mode of transmission: Secondary | ICD-10-CM | POA: Diagnosis not present

## 2016-11-01 DIAGNOSIS — Z01419 Encounter for gynecological examination (general) (routine) without abnormal findings: Secondary | ICD-10-CM | POA: Diagnosis not present

## 2016-11-01 DIAGNOSIS — H9203 Otalgia, bilateral: Secondary | ICD-10-CM | POA: Diagnosis not present

## 2016-11-01 DIAGNOSIS — R87610 Atypical squamous cells of undetermined significance on cytologic smear of cervix (ASC-US): Secondary | ICD-10-CM | POA: Diagnosis not present

## 2016-11-01 DIAGNOSIS — A609 Anogenital herpesviral infection, unspecified: Secondary | ICD-10-CM | POA: Diagnosis not present

## 2016-11-09 DIAGNOSIS — Z13220 Encounter for screening for lipoid disorders: Secondary | ICD-10-CM | POA: Diagnosis not present

## 2016-11-17 DIAGNOSIS — M654 Radial styloid tenosynovitis [de Quervain]: Secondary | ICD-10-CM | POA: Diagnosis not present

## 2016-11-21 DIAGNOSIS — S0300XA Dislocation of jaw, unspecified side, initial encounter: Secondary | ICD-10-CM | POA: Diagnosis not present

## 2016-11-21 DIAGNOSIS — H9191 Unspecified hearing loss, right ear: Secondary | ICD-10-CM | POA: Diagnosis not present

## 2016-11-21 DIAGNOSIS — H9313 Tinnitus, bilateral: Secondary | ICD-10-CM | POA: Diagnosis not present

## 2016-11-21 DIAGNOSIS — R51 Headache: Secondary | ICD-10-CM | POA: Diagnosis not present

## 2017-01-26 ENCOUNTER — Encounter: Payer: Self-pay | Admitting: Neurology

## 2017-01-26 ENCOUNTER — Encounter (INDEPENDENT_AMBULATORY_CARE_PROVIDER_SITE_OTHER): Payer: Self-pay

## 2017-01-26 ENCOUNTER — Ambulatory Visit (INDEPENDENT_AMBULATORY_CARE_PROVIDER_SITE_OTHER): Payer: Medicare Other | Admitting: Neurology

## 2017-01-26 VITALS — BP 109/66 | HR 58 | Ht 63.0 in | Wt 142.0 lb

## 2017-01-26 DIAGNOSIS — R202 Paresthesia of skin: Secondary | ICD-10-CM | POA: Diagnosis not present

## 2017-01-26 DIAGNOSIS — G43709 Chronic migraine without aura, not intractable, without status migrainosus: Secondary | ICD-10-CM

## 2017-01-26 DIAGNOSIS — IMO0002 Reserved for concepts with insufficient information to code with codable children: Secondary | ICD-10-CM

## 2017-01-26 MED ORDER — ONDANSETRON 4 MG PO TBDP: 4.0000 mg | ORAL_TABLET | Freq: Three times a day (TID) | ORAL | 11 refills | Status: DC | PRN

## 2017-01-26 MED ORDER — RIZATRIPTAN BENZOATE 10 MG PO TBDP: 10.0000 mg | ORAL_TABLET | ORAL | 11 refills | Status: DC | PRN

## 2017-01-26 MED ORDER — NORTRIPTYLINE HCL 10 MG PO CAPS: 20.0000 mg | ORAL_CAPSULE | Freq: Every day | ORAL | 11 refills | Status: DC

## 2017-01-26 NOTE — Progress Notes (Signed)
PATIENT: [name] [name] DOB: December 16, 1985  Chief Complaint  Patient presents with  . Headache    She has been having problems with headaches since 2011.  She has never taken any preventive medication.  She is here today for increased frequency of headaches.  She uses OTC NSAIDS for pain.  She has previously tried sumatriptan 100mg  tablets but states it takes 3-4 hours, along with rest, for it to work.  . ENT    Graylin Shiver, MD - referring provider  . PCP    Pavelock, Duke Salvia, MD     HISTORICAL  [name] [name] is a 32 year old female, seen in refer by ENT Dr. Billy Fischer for evaluation of headaches, primary care physician is Dr. Midge Aver, Duke Salvia, initial evaluation was on January 26, 2017.  I reviewed and summarized the referring note, she has past medical history of depression, fetal alcohol syndrome.  She reported history of migraine headaches since 2011, her typical migraine are lateralized severe pounding headache with associated light noise, smell sensitivity, lasting for a few hours to 1 day.  She reported increased frequency of migraine headaches since 2018, can happen every other day, took sumatriptan 3-4 hours to help her headaches.  Trigger for her migraines are stress, bright light, strong smells,  Since 2018, she also noticed intermittent left facial, arm numbness during intense headaches,  We have personally reviewed CT head without contrast in October 2017 there was no significant abnormality.  REVIEW OF SYSTEMS: Full 14 system review of systems performed and notable only for headaches  ALLERGIES: Allergies  Allergen Reactions  . Ibuprofen Swelling    Eyes swelled   . Septra [Sulfamethoxazole-Trimethoprim] Hives    legs    HOME MEDICATIONS: Current Outpatient Medications  Medication Sig Dispense Refill  . fluticasone (FLONASE) 50 MCG/ACT nasal spray Place 1 spray into both nostrils daily.   3  . HYDROcodone-acetaminophen  (NORCO/VICODIN) 5-325 MG tablet Take 1 tablet by mouth every 4 (four) hours as needed. 10 tablet 0  . meclizine (ANTIVERT) 25 MG tablet Take 1 tablet (25 mg total) by mouth 3 (three) times daily as needed for dizziness or nausea. 30 tablet 0  . ondansetron (ZOFRAN ODT) 4 MG disintegrating tablet Take 1 tablet (4 mg total) by mouth every 8 (eight) hours as needed for nausea or vomiting. 20 tablet 0   No current facility-administered medications for this visit.    Facility-Administered Medications Ordered in Other Visits  Medication Dose Route Frequency Provider Last Rate Last Dose  . rho(d) immune globulin (RHIG/Rhophylac) injection 300 mcg  300 mcg Intramuscular Once Kathreen Cosier, MD        PAST MEDICAL HISTORY: Past Medical History:  Diagnosis Date  . Depression    post partum  . 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 History of chlamydia   . Migraine     PAST SURGICAL HISTORY: Past Surgical History:  Procedure Laterality Date  . APPENDECTOMY    . EYE SURGERY    . TYMPANOSTOMY TUBE PLACEMENT      FAMILY HISTORY: Family History  Problem Relation Age of Onset  . Alcohol abuse Mother   . Seizures Father   . Alcohol abuse Father     SOCIAL HISTORY:  Social History   Socioeconomic History  . Marital status: Single    Spouse name: Not on file  . Number of children: 4  . Years of education: 1 year college  .  Highest education level: Not on file  Social Needs  . Financial resource strain: Not on file  . Food insecurity - worry: Not on file  . Food insecurity - inability: Not on file  . Transportation needs - medical: Not on file  . Transportation needs - non-medical: Not on file  Occupational History  . Occupation: Unemployed  Tobacco Use  . Smoking status: Former Smoker    Types: Cigarettes  . Smokeless tobacco: Never Used  Substance and Sexual Activity  . Alcohol use: Yes    Comment: Rare use  . Drug use: No  . Sexual  activity: Yes    Birth control/protection: None  Other Topics Concern  . Not on file  Social History Narrative   Lives at home with her children.   Right-handed.   1 to 1.5 cups caffeine per day.     PHYSICAL EXAM   Vitals:   01/26/17 0921  BP: 109/66  Pulse: (!) 58  Weight: 142 lb (64.4 kg)  Height: 5\' 3"  (1.6 m)    Not recorded      Body mass index is 25.15 kg/m.  PHYSICAL EXAMNIATION:  Gen: NAD, conversant, well nourised, obese, well groomed                     Cardiovascular: Regular rate rhythm, no peripheral edema, warm, nontender. Eyes: Conjunctivae clear without exudates or hemorrhage Neck: Supple, no carotid bruits. Pulmonary: Clear to auscultation bilaterally   NEUROLOGICAL EXAM:  MENTAL STATUS: Speech:    Speech is normal; fluent and spontaneous with normal comprehension.  Cognition:     Orientation to time, place and person     Normal recent and remote memory     Normal Attention span and concentration     Normal Language, naming, repeating,spontaneous speech     Fund of knowledge   CRANIAL NERVES: CN II: Visual fields are full to confrontation. Fundoscopic exam is normal with sharp discs and no vascular changes. Pupils are round equal and briskly reactive to light. CN III, IV, VI: extraocular movement are normal. Left ptosis. CN V: Facial sensation is intact to pinprick in all 3 divisions bilaterally. Corneal responses are intact.  CN VII: Asymmetry of face, lower lying left upper eyelid. CN VIII: Hearing is normal to rubbing fingers CN IX, X: Palate elevates symmetrically. Phonation is normal. CN XI: Head turning and shoulder shrug are intact CN XII: Tongue is midline with normal movements and no atrophy.  MOTOR: There is no pronator drift of out-stretched arms. Muscle bulk and tone are normal. Muscle strength is normal.  REFLEXES: Reflexes are 2+ and symmetric at the biceps, triceps, knees, and ankles. Plantar responses are  flexor.  SENSORY: Intact to light touch, pinprick, positional sensation and vibratory sensation are intact in fingers and toes.  COORDINATION: Rapid alternating movements and fine finger movements are intact. There is no dysmetria on finger-to-nose and heel-knee-shin.    GAIT/STANCE: Posture is normal. Gait is steady with normal steps, base, arm swing, and turning. Heel and toe walking are normal. Tandem gait is normal.  Romberg is absent.   DIAGNOSTIC DATA (LABS, IMAGING, TESTING) - I reviewed patient records, labs, notes, testing and imaging myself where available.   ASSESSMENT AND PLAN  Billie Ruddyiffany S Stanislaw is a 32 y.o. female   Chronic migraine headaches, with associated left hemiparesthesia Fetal alcohol syndrome  MRI of the brain to rule out structural lesion  Start preventive medication nortriptyline 10 mg, titrating to 20 mg  every night  Maxalt 10 mg as needed, may combine it with Zofran, over-the-counter Aleve  Levert Feinstein, M.D. Ph.D.  [facility] [address] [place], [place] [address] Ph: [phone] Fax: [phone]  [email or web address], Duke Salvia, MD

## 2017-01-26 NOTE — Patient Instructions (Signed)
You can take Maxalt dissolvable 10 mg as needed  Together with Zofran 4 mg as needed Aleve 500 mg as needed during a prolonged severe headaches,  Take nortriptyline 10 mg every night for 1 week, then 2 tablets every night as migraine prevention

## 2017-02-09 ENCOUNTER — Other Ambulatory Visit: Payer: Medicare Other

## 2017-03-04 ENCOUNTER — Other Ambulatory Visit: Payer: Self-pay | Admitting: Neurology

## 2017-03-28 DIAGNOSIS — A609 Anogenital herpesviral infection, unspecified: Secondary | ICD-10-CM | POA: Diagnosis not present

## 2017-03-28 DIAGNOSIS — Z113 Encounter for screening for infections with a predominantly sexual mode of transmission: Secondary | ICD-10-CM | POA: Diagnosis not present

## 2017-03-28 DIAGNOSIS — Z3046 Encounter for surveillance of implantable subdermal contraceptive: Secondary | ICD-10-CM | POA: Diagnosis not present

## 2017-03-28 DIAGNOSIS — R87610 Atypical squamous cells of undetermined significance on cytologic smear of cervix (ASC-US): Secondary | ICD-10-CM | POA: Diagnosis not present

## 2017-03-28 DIAGNOSIS — H9203 Otalgia, bilateral: Secondary | ICD-10-CM | POA: Diagnosis not present

## 2017-03-28 DIAGNOSIS — N760 Acute vaginitis: Secondary | ICD-10-CM | POA: Diagnosis not present

## 2017-04-11 ENCOUNTER — Ambulatory Visit (HOSPITAL_COMMUNITY)
Admission: EM | Admit: 2017-04-11 | Discharge: 2017-04-11 | Disposition: A | Discharge status: Home / Self Care | Payer: Medicare Other | Attending: Physician Assistant | Admitting: Physician Assistant

## 2017-04-11 ENCOUNTER — Ambulatory Visit (INDEPENDENT_AMBULATORY_CARE_PROVIDER_SITE_OTHER): Payer: Medicare Other

## 2017-04-11 ENCOUNTER — Encounter (HOSPITAL_COMMUNITY): Payer: Self-pay | Admitting: Emergency Medicine

## 2017-04-11 DIAGNOSIS — Z886 Allergy status to analgesic agent status: Secondary | ICD-10-CM | POA: Diagnosis not present

## 2017-04-11 DIAGNOSIS — R319 Hematuria, unspecified: Secondary | ICD-10-CM | POA: Insufficient documentation

## 2017-04-11 DIAGNOSIS — M79652 Pain in left thigh: Secondary | ICD-10-CM | POA: Insufficient documentation

## 2017-04-11 DIAGNOSIS — G43909 Migraine, unspecified, not intractable, without status migrainosus: Secondary | ICD-10-CM | POA: Diagnosis not present

## 2017-04-11 DIAGNOSIS — Z3202 Encounter for pregnancy test, result negative: Secondary | ICD-10-CM

## 2017-04-11 DIAGNOSIS — R1084 Generalized abdominal pain: Secondary | ICD-10-CM

## 2017-04-11 DIAGNOSIS — R110 Nausea: Secondary | ICD-10-CM | POA: Diagnosis not present

## 2017-04-11 DIAGNOSIS — F329 Major depressive disorder, single episode, unspecified: Secondary | ICD-10-CM | POA: Diagnosis not present

## 2017-04-11 DIAGNOSIS — Z87891 Personal history of nicotine dependence: Secondary | ICD-10-CM | POA: Diagnosis not present

## 2017-04-11 DIAGNOSIS — R1010 Upper abdominal pain, unspecified: Secondary | ICD-10-CM | POA: Diagnosis not present

## 2017-04-11 LAB — POCT I-STAT, CHEM 8
BUN: 6 mg/dL (ref 6–20)
CREATININE: 0.9 mg/dL (ref 0.44–1.00)
Calcium, Ion: 1.21 mmol/L (ref 1.15–1.40)
Chloride: 100 mmol/L — ABNORMAL LOW (ref 101–111)
Glucose, Bld: 92 mg/dL (ref 65–99)
HEMATOCRIT: 41 % (ref 36.0–46.0)
HEMOGLOBIN: 13.9 g/dL (ref 12.0–15.0)
POTASSIUM: 4 mmol/L (ref 3.5–5.1)
Sodium: 139 mmol/L (ref 135–145)
TCO2: 26 mmol/L (ref 22–32)

## 2017-04-11 LAB — POCT URINALYSIS DIP (DEVICE)
BILIRUBIN URINE: NEGATIVE
Glucose, UA: NEGATIVE mg/dL
KETONES UR: 15 mg/dL — AB
LEUKOCYTES UA: NEGATIVE
Nitrite: NEGATIVE
PH: 6 (ref 5.0–8.0)
Protein, ur: NEGATIVE mg/dL
Urobilinogen, UA: 1 mg/dL (ref 0.0–1.0)

## 2017-04-11 LAB — POCT PREGNANCY, URINE: Preg Test, Ur: NEGATIVE

## 2017-04-11 MED ORDER — NAPROXEN 500 MG PO TABS: 500.0000 mg | ORAL_TABLET | Freq: Two times a day (BID) | ORAL | 0 refills | Status: AC

## 2017-04-11 MED ORDER — ONDANSETRON HCL 4 MG PO TABS: 4.0000 mg | ORAL_TABLET | Freq: Four times a day (QID) | ORAL | 0 refills | Status: DC

## 2017-04-11 MED ORDER — ONDANSETRON 4 MG PO TBDP
4.0000 mg | ORAL_TABLET | Freq: Once | ORAL | Status: AC
  Administered 2017-04-11: 4 mg via ORAL

## 2017-04-11 MED ORDER — KETOROLAC TROMETHAMINE 60 MG/2ML IM SOLN
60.0000 mg | Freq: Once | INTRAMUSCULAR | Status: AC
  Administered 2017-04-11: 60 mg via INTRAMUSCULAR

## 2017-04-11 MED ORDER — KETOROLAC TROMETHAMINE 60 MG/2ML IM SOLN
INTRAMUSCULAR | Status: AC
  Filled 2017-04-11: qty 2

## 2017-04-11 MED ORDER — ONDANSETRON 4 MG PO TBDP
ORAL_TABLET | ORAL | Status: AC
  Filled 2017-04-11: qty 1

## 2017-04-11 NOTE — Discharge Instructions (Signed)
You may have a kidney stone.  Please take the naprosyn as prescribed. The remainder of your workup is reassuring.  Come back if your having any changes in your symptoms./

## 2017-04-11 NOTE — ED Provider Notes (Signed)
04/11/2017 7:55 PM   DOB: 1985-06-21 / MRN: [id number]  SUBJECTIVE:  [name] [name] is a 32 y.o. female presenting for left thigh pain as well as abdominal pain.  Symptoms started together.  She associates nausea.  She denies dysuria, hematuria, urinary frequency and urgency.  She tells me she has had the leg pain in the past and that a pain pill made it better.  She denies back pain at this time.  Last bowel movement was this morning and normal for her.  She is allergic to ibuprofen and septra [sulfamethoxazole-trimethoprim].   She  has a past medical history of Depression, Fetal alcohol syndrome, History of chlamydia, and Migraine.    She  reports that she has quit smoking. Her smoking use included cigarettes. she has never used smokeless tobacco. She reports that she drinks alcohol. She reports that she does not use drugs. She  reports that she currently engages in sexual activity. She reports using the following method of birth control/protection: None. The patient  has a past surgical history that includes Appendectomy; Eye surgery; and Tympanostomy tube placement.  Her family history includes Alcohol abuse in her father and mother; Seizures in her father.  Review of Systems  Constitutional: Negative for chills, diaphoresis and fever.  Respiratory: Negative for cough, hemoptysis, sputum production, shortness of breath and wheezing.   Cardiovascular: Negative for chest pain, orthopnea and leg swelling.  Gastrointestinal: Negative for nausea.  Skin: Negative for rash.  Neurological: Negative for dizziness.    OBJECTIVE:  BP 110/65 (BP Location: Right Arm)   Pulse (!) 110   Temp 98.6 F (37 C) (Oral)   Resp 18   SpO2 99%   Lab Results  Component Value Date   CREATININE 1.00 02/12/2016     Pulse Readings from Last 3 Encounters:  04/11/17 (!) 110  01/26/17 (!) 58  02/12/16 83   BP Readings from Last 3 Encounters:  04/11/17 110/65  01/26/17 109/66  02/12/16 123/78   Wt  Readings from Last 3 Encounters:  01/26/17 142 lb (64.4 kg)  02/12/16 132 lb (59.9 kg)  01/13/16 125 lb (56.7 kg)   Physical Exam  Constitutional: She is oriented to person, place, and time. She is active.  Non-toxic appearance.  Eyes: EOM are normal. Pupils are equal, round, and reactive to light.  Cardiovascular: Normal rate, regular rhythm, S1 normal, S2 normal, normal heart sounds and intact distal pulses. Exam reveals no gallop, no friction rub and no decreased pulses.  No murmur heard. Pulmonary/Chest: Effort normal. No stridor. No tachypnea. No respiratory distress. She has no wheezes. She has no rales.  Abdominal: Soft. Bowel sounds are normal. She exhibits no distension and no mass. There is no tenderness. There is no rebound and no guarding.  Musculoskeletal: She exhibits no edema or tenderness.       Left hip: Normal.       Left knee: Normal.  Neurological: She is alert and oriented to person, place, and time. She has normal strength and normal reflexes. She is not disoriented. She displays no atrophy. No cranial nerve deficit or sensory deficit. She exhibits normal muscle tone. Coordination and gait normal.  Skin: Skin is warm and dry. She is not diaphoretic. No pallor.  Psychiatric: Her behavior is normal.    Results for orders placed or performed during the hospital encounter of 04/11/17 (from the past 72 hour(s))  POCT urinalysis dip (device)     Status: Abnormal   Collection Time: 04/11/17  6:56  PM  Result Value Ref Range   Glucose, UA NEGATIVE NEGATIVE mg/dL   Bilirubin Urine NEGATIVE NEGATIVE   Ketones, ur 15 (A) NEGATIVE mg/dL   Specific Gravity, Urine >=1.030 1.005 - 1.030   Hgb urine dipstick TRACE (A) NEGATIVE   pH 6.0 5.0 - 8.0   Protein, ur NEGATIVE NEGATIVE mg/dL   Urobilinogen, UA 1.0 0.0 - 1.0 mg/dL   Nitrite NEGATIVE NEGATIVE   Leukocytes, UA NEGATIVE NEGATIVE    Comment: Biochemical Testing Only. Please order routine urinalysis from main lab if  confirmatory testing is needed.  Pregnancy, urine POC     Status: None   Collection Time: 04/11/17  7:03 PM  Result Value Ref Range   Preg Test, Ur NEGATIVE NEGATIVE    Comment:        THE SENSITIVITY OF THIS METHODOLOGY IS >24 mIU/mL     Dg Abd 1 View  Result Date: 04/11/2017 CLINICAL DATA:  Upper abdominal pain EXAM: ABDOMEN - 1 VIEW COMPARISON:  None. FINDINGS: The bowel gas pattern is normal. No radio-opaque calculi or other significant radiographic abnormality are seen. IMPRESSION: Negative. Electronically Signed   By: Jasmine Pang M.D.   On: 04/11/2017 19:40    ASSESSMENT AND PLAN:  Orders Placed This Encounter  Procedures  . DG Abd 1 View    Standing Status:   Standing    Number of Occurrences:   1    Order Specific Question:   Reason for Exam (SYMPTOM  OR DIAGNOSIS REQUIRED)    Answer:   Groin pain radiating to the leg.  Blood on the dipstick.  Rule in nephrolithiasis.  Marland Kitchen POCT urinalysis dip (device)    Standing Status:   Standing    Number of Occurrences:   1  . Pregnancy, urine POC    Standing Status:   Standing    Number of Occurrences:   1   Generalized abdominal pain - She has been exam.  She appeared more comfortable to x-ray after her shot of IM Toradol.  She has some mild hematuria. Naprosyn prescribed.  Her workup is reassuring.  RTC as needed.       The patient is advised to call or return to clinic if she does not see an improvement in symptoms, or to seek the care of the closest emergency department if she worsens with the above plan.   Deliah Boston, MHS, PA-C 04/11/2017 7:55 PM    Ofilia Neas, PA-C 04/11/17 2006

## 2017-04-11 NOTE — ED Triage Notes (Signed)
Pt sts body aches and nausea today

## 2017-04-12 LAB — CERVICOVAGINAL ANCILLARY ONLY
BACTERIAL VAGINITIS: NEGATIVE
Candida vaginitis: NEGATIVE
Chlamydia: NEGATIVE
NEISSERIA GONORRHEA: NEGATIVE
TRICH (WINDOWPATH): NEGATIVE

## 2017-04-23 ENCOUNTER — Encounter (HOSPITAL_COMMUNITY): Payer: Self-pay | Admitting: Family Medicine

## 2017-04-23 ENCOUNTER — Ambulatory Visit
Admission: RE | Admit: 2017-04-23 | Discharge: 2017-04-23 | Disposition: A | Discharge status: Home / Self Care | Payer: Medicare Other | Source: Ambulatory Visit | Attending: Neurology | Admitting: Neurology

## 2017-04-23 ENCOUNTER — Ambulatory Visit (HOSPITAL_COMMUNITY)
Admission: EM | Admit: 2017-04-23 | Discharge: 2017-04-23 | Disposition: A | Discharge status: Home / Self Care | Payer: Medicare Other

## 2017-04-23 DIAGNOSIS — R202 Paresthesia of skin: Secondary | ICD-10-CM | POA: Diagnosis not present

## 2017-04-23 DIAGNOSIS — G43709 Chronic migraine without aura, not intractable, without status migrainosus: Secondary | ICD-10-CM | POA: Diagnosis not present

## 2017-04-23 DIAGNOSIS — IMO0002 Reserved for concepts with insufficient information to code with codable children: Secondary | ICD-10-CM

## 2017-04-23 NOTE — ED Triage Notes (Signed)
Pt here for right lateral upper leg pain. sts that she was pushing the lawn mower yesterday. She came for a "shot".

## 2017-04-24 ENCOUNTER — Telehealth: Payer: Self-pay | Admitting: *Deleted

## 2017-04-24 NOTE — Telephone Encounter (Signed)
-----   Message from Levert FeinsteinYijun Yan, MD sent at 04/24/2017  3:25 PM EDT ----- Please call pt for normal MRI brain.  There is mild chronic maxillary sinusitis

## 2017-04-24 NOTE — Telephone Encounter (Signed)
Left patient a detailed message, with results, on voicemail (ok per DPR).  Provided our number to call back with any questions.  

## 2017-04-25 ENCOUNTER — Encounter: Payer: Self-pay | Admitting: Neurology

## 2017-04-25 ENCOUNTER — Ambulatory Visit (INDEPENDENT_AMBULATORY_CARE_PROVIDER_SITE_OTHER): Payer: Medicare Other | Admitting: Neurology

## 2017-04-25 VITALS — BP 107/67 | HR 65 | Ht 63.0 in | Wt 137.0 lb

## 2017-04-25 DIAGNOSIS — G43709 Chronic migraine without aura, not intractable, without status migrainosus: Secondary | ICD-10-CM | POA: Diagnosis not present

## 2017-04-25 DIAGNOSIS — IMO0002 Reserved for concepts with insufficient information to code with codable children: Secondary | ICD-10-CM

## 2017-04-25 MED ORDER — NORTRIPTYLINE HCL 10 MG PO CAPS: 20.0000 mg | ORAL_CAPSULE | Freq: Every day | ORAL | 4 refills | Status: DC

## 2017-04-25 NOTE — Progress Notes (Signed)
PATIENT: [name] [name] DOB: 06/16/1985  Chief Complaint  Patient presents with  . Migraine    She would like to review her MRI. Her migraines have improved with nortriptyline.  She has only been taking 10mg  at bedtime during the week because it causes her to feel drowsy the following morning.   Strong smells tend to trigger migraines.  Rizaptriptan has been helpful for her pain.     HISTORICAL  [name] [name] is a 32 year old female, seen in refer by ENT Dr. Billy Fischer for evaluation of headaches, primary care physician is Dr. Midge Aver, Duke Salvia, initial evaluation was on January 26, 2017.  I reviewed and summarized the referring note, she has past medical history of depression, fetal alcohol syndrome.  She reported history of migraine headaches since 2011, her typical migraine are lateralized severe pounding headache with associated light noise, smell sensitivity, lasting for a few hours to 1 day.  She reported increased frequency of migraine headaches since 2018, can happen every other day, took sumatriptan 3-4 hours to help her headaches.  Trigger for her migraines are stress, bright light, strong smells,  Since 2018, she also noticed intermittent left facial, arm numbness during intense headaches,  We have personally reviewed CT head without contrast in October 2017 there was no significant abnormality.  UPDATE April 25 2017: MRI brain on April 23 2017 was normal.   She has been taking nortriptyline 10 mg up to 2 tablets every night as migraine prevention, Maxalt as needed was helpful, she rarely has migraine.  REVIEW OF SYSTEMS: Full 14 system review of systems performed and notable only for runny nose, back pain, achy muscles, headaches  ALLERGIES: Allergies  Allergen Reactions  . Ibuprofen Swelling    Eyes swelled   . Septra [Sulfamethoxazole-Trimethoprim] Hives    legs    HOME MEDICATIONS: Current Outpatient Medications  Medication Sig Dispense  Refill  . fluticasone (FLONASE) 50 MCG/ACT nasal spray Place 1 spray into both nostrils daily.   3  . nortriptyline (PAMELOR) 10 MG capsule Take 2 capsules (20 mg total) by mouth at bedtime. 60 capsule 11  . ondansetron (ZOFRAN ODT) 4 MG disintegrating tablet Take 1 tablet (4 mg total) by mouth every 8 (eight) hours as needed for nausea or vomiting. 20 tablet 11  . ondansetron (ZOFRAN) 4 MG tablet Take 1 tablet (4 mg total) by mouth every 6 (six) hours. 12 tablet 0  . rizatriptan (MAXALT-MLT) 10 MG disintegrating tablet Take 1 tablet (10 mg total) by mouth as needed. May repeat in 2 hours if needed 15 tablet 11   No current facility-administered medications for this visit.    Facility-Administered Medications Ordered in Other Visits  Medication Dose Route Frequency Provider Last Rate Last Dose  . rho(d) immune globulin (RHIG/Rhophylac) injection 300 mcg  300 mcg Intramuscular Once Kathreen Cosier, MD        PAST MEDICAL HISTORY: Past Medical History:  Diagnosis Date  . Depression    post partum  . 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 History of chlamydia   . Migraine     PAST SURGICAL HISTORY: Past Surgical History:  Procedure Laterality Date  . APPENDECTOMY    . EYE SURGERY    . TYMPANOSTOMY TUBE PLACEMENT      FAMILY HISTORY: Family History  Problem Relation Age of Onset  . Alcohol abuse Mother   . Seizures Father   . Alcohol abuse Father  SOCIAL HISTORY:  Social History   Socioeconomic History  . Marital status: Single    Spouse name: Not on file  . Number of children: 4  . Years of education: 1 year college  . Highest education level: Not on file  Occupational History  . Occupation: Unemployed  Social Needs  . Financial resource strain: Not on file  . Food insecurity:    Worry: Not on file    Inability: Not on file  . Transportation needs:    Medical: Not on file    Non-medical: Not on file  Tobacco Use  . Smoking  status: Former Smoker    Types: Cigarettes  . Smokeless tobacco: Never Used  Substance and Sexual Activity  . Alcohol use: Yes    Comment: Rare use  . Drug use: No  . Sexual activity: Yes    Birth control/protection: None  Lifestyle  . Physical activity:    Days per week: Not on file    Minutes per session: Not on file  . Stress: Not on file  Relationships  . Social connections:    Talks on phone: Not on file    Gets together: Not on file    Attends religious service: Not on file    Active member of club or organization: Not on file    Attends meetings of clubs or organizations: Not on file    Relationship status: Not on file  . Intimate partner violence:    Fear of current or ex partner: Not on file    Emotionally abused: Not on file    Physically abused: Not on file    Forced sexual activity: Not on file  Other Topics Concern  . Not on file  Social History Narrative   Lives at home with her children.   Right-handed.   1 to 1.5 cups caffeine per day.     PHYSICAL EXAM   Vitals:   04/25/17 0920  BP: 107/67  Pulse: 65  Weight: 137 lb (62.1 kg)  Height: 5\' 3"  (1.6 m)    Not recorded      Body mass index is 24.27 kg/m.  PHYSICAL EXAMNIATION:  Gen: NAD, conversant, well nourised, obese, well groomed                     Cardiovascular: Regular rate rhythm, no peripheral edema, warm, nontender. Eyes: Conjunctivae clear without exudates or hemorrhage Neck: Supple, no carotid bruits. Pulmonary: Clear to auscultation bilaterally   NEUROLOGICAL EXAM:  MENTAL STATUS: Speech:    Speech is normal; fluent and spontaneous with normal comprehension.  Cognition:     Orientation to time, place and person     Normal recent and remote memory     Normal Attention span and concentration     Normal Language, naming, repeating,spontaneous speech     Fund of knowledge   CRANIAL NERVES: CN II: Visual fields are full to confrontation. Fundoscopic exam is normal with  sharp discs and no vascular changes. Pupils are round equal and briskly reactive to light. CN III, IV, VI: extraocular movement are normal. Left ptosis. CN V: Facial sensation is intact to pinprick in all 3 divisions bilaterally. Corneal responses are intact.  CN VII: Asymmetry of face, lower lying left upper eyelid. CN VIII: Hearing is normal to rubbing fingers CN IX, X: Palate elevates symmetrically. Phonation is normal. CN XI: Head turning and shoulder shrug are intact CN XII: Tongue is midline with normal movements and no atrophy.  MOTOR: There is no pronator drift of out-stretched arms. Muscle bulk and tone are normal. Muscle strength is normal.  REFLEXES: Reflexes are 2+ and symmetric at the biceps, triceps, knees, and ankles. Plantar responses are flexor.  SENSORY: Intact to light touch, pinprick, positional sensation and vibratory sensation are intact in fingers and toes.  COORDINATION: Rapid alternating movements and fine finger movements are intact. There is no dysmetria on finger-to-nose and heel-knee-shin.    GAIT/STANCE: Posture is normal. Gait is steady with normal steps, base, arm swing, and turning. Heel and toe walking are normal. Tandem gait is normal.  Romberg is absent.   DIAGNOSTIC DATA (LABS, IMAGING, TESTING) - I reviewed patient records, labs, notes, testing and imaging myself where available.   ASSESSMENT AND PLAN  [name] [name] is a 32 y.o. female   Chronic migraine headaches, with associated left hemiparesthesia Fetal alcohol syndrome  MRI of the brain was normal.  Keep preventive medication nortriptyline 10 mg 2 tablest every night  Maxalt 10 mg as needed   Levert Feinstein, M.D. Ph.D.  [facility] [address] [place], [place] [address] Ph: [phone] Fax: [phone]  [email or web address], Duke Salvia, MD

## 2017-05-22 ENCOUNTER — Encounter (HOSPITAL_COMMUNITY): Payer: Self-pay | Admitting: Emergency Medicine

## 2017-05-22 ENCOUNTER — Ambulatory Visit (HOSPITAL_COMMUNITY)
Admission: EM | Admit: 2017-05-22 | Discharge: 2017-05-22 | Disposition: A | Discharge status: Home / Self Care | Payer: Medicare Other | Attending: Internal Medicine | Admitting: Internal Medicine

## 2017-05-22 ENCOUNTER — Other Ambulatory Visit: Payer: Self-pay

## 2017-05-22 DIAGNOSIS — S161XXA Strain of muscle, fascia and tendon at neck level, initial encounter: Secondary | ICD-10-CM

## 2017-05-22 DIAGNOSIS — L298 Other pruritus: Secondary | ICD-10-CM | POA: Diagnosis not present

## 2017-05-22 MED ORDER — CYCLOBENZAPRINE HCL 10 MG PO TABS: 10.0000 mg | ORAL_TABLET | Freq: Two times a day (BID) | ORAL | 0 refills | Status: DC | PRN

## 2017-05-22 MED ORDER — PERMETHRIN 5 % EX CREA: TOPICAL_CREAM | CUTANEOUS | 0 refills | Status: DC

## 2017-05-22 MED ORDER — ACETAMINOPHEN 500 MG PO TABS: 1000.0000 mg | ORAL_TABLET | Freq: Four times a day (QID) | ORAL | 0 refills | Status: DC | PRN

## 2017-05-22 MED ORDER — KETOROLAC TROMETHAMINE 60 MG/2ML IM SOLN
INTRAMUSCULAR | Status: AC
  Filled 2017-05-22: qty 2

## 2017-05-22 MED ORDER — KETOROLAC TROMETHAMINE 60 MG/2ML IM SOLN
60.0000 mg | Freq: Once | INTRAMUSCULAR | Status: AC
  Administered 2017-05-22: 60 mg via INTRAMUSCULAR

## 2017-05-22 MED ORDER — PREDNISONE 50 MG PO TABS: 50.0000 mg | ORAL_TABLET | Freq: Every day | ORAL | 0 refills | Status: AC

## 2017-05-22 NOTE — Discharge Instructions (Signed)
Use Tylenol to help with neck pain.  He may supplement with Flexeril, this may cause some sedation, do not drive or work after use.  Please reserve use to at home or at bedtime.  You may cut tablet in half if causing too much drowsiness.  Prednisone 50 mg x 5 days  Apply ice and heating pads to the neck/back.  Please return if symptoms not improving or symptoms worsening.

## 2017-05-22 NOTE — ED Triage Notes (Signed)
States woke up yesterday AM with left side neck pain, denies relief with muscle relaxer or tylenol.

## 2017-05-22 NOTE — ED Provider Notes (Signed)
[facility]    CSN: [id number] Arrival date & time: 05/22/17  1003     History   Chief Complaint Chief Complaint  Patient presents with  . Neck Pain    HPI [name] [name] is a 32 y.o. female presenting today for evaluation of itching as well as neck pain.  Patient's main complaint is her neck pain.  States that for the past 3 days she has had left-sided neck pain and has difficulty turning her head towards the left.  Denies any injury.  States that she woke up like this 3 days ago.  She has tried over-the-counter body and back medication.  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  Denies any numbness or tingling.  Left arm does feel slightly weaker.  Her children stayed with her grandmother this week and returned with an itchy rash, possible exposure to bugs.  Patient has began to feel itchy as well.  HPI  Past Medical History:  Diagnosis Date  . Depression    post partum  . 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 History of chlamydia   . Migraine     Patient Active Problem List   Diagnosis Date Noted  . Chronic migraine 01/26/2017  . Paresthesia 01/26/2017  . Active labor 11/04/2013  . Normal delivery at term 11/04/2013  . Normal delivery 06/04/2011    Past Surgical History:  Procedure Laterality Date  . APPENDECTOMY    . EYE SURGERY    . TYMPANOSTOMY TUBE PLACEMENT      OB History    Gravida  4   Para  4   Term  4   Preterm  0   AB  0   Living  4     SAB  0   TAB  0   Ectopic  0   Multiple  0   Live Births  4            Home Medications    Prior to Admission medications   Medication Sig Start Date End Date Taking? Authorizing Provider  etonogestrel (NEXPLANON) 68 MG IMPL implant 1 each by Subdermal route once.   Yes [provider]  acetaminophen (TYLENOL) 500 MG tablet Take 2 tablets (1,000 mg total) by mouth every 6 (six) hours as needed. 05/22/17   Wieters,  Hallie C, PA-C  cyclobenzaprine (FLEXERIL) 10 MG tablet Take 1 tablet (10 mg total) by mouth 2 (two) times daily as needed for muscle spasms. 05/22/17   Wieters, Hallie C, PA-C  fluticasone (FLONASE) 50 MCG/ACT nasal spray Place 1 spray into both nostrils daily.  03/05/14   [provider]  nortriptyline (PAMELOR) 10 MG capsule Take 2 capsules (20 mg total) by mouth at bedtime. 04/25/17   Levert Feinstein, MD  permethrin (ELIMITE) 5 % cream Apply to affected area once 05/22/17   Wieters, Hallie C, PA-C  predniSONE (DELTASONE) 50 MG tablet Take 1 tablet (50 mg total) by mouth daily for 5 days. 05/22/17 05/27/17  Wieters, Hallie C, PA-C  rizatriptan (MAXALT-MLT) 10 MG disintegrating tablet Take 1 tablet (10 mg total) by mouth as needed. May repeat in 2 hours if needed 01/26/17   Levert Feinstein, MD    Family History Family History  Problem Relation Age of Onset  . Alcohol abuse Mother   . Seizures Father   . Alcohol abuse Father     Social History Social History   Tobacco Use  .  Smoking status: Former Smoker    Types: Cigarettes  . Smokeless tobacco: Never Used  Substance Use Topics  . Alcohol use: Yes    Comment: Rare use  . Drug use: No     Allergies   Ibuprofen and Septra [sulfamethoxazole-trimethoprim]   Review of Systems Review of Systems  Constitutional: Negative for activity change and appetite change.  HENT: Negative for trouble swallowing.   Eyes: Negative for pain and visual disturbance.  Respiratory: Negative for shortness of breath.   Cardiovascular: Negative for chest pain.  Gastrointestinal: Negative for abdominal pain, nausea and vomiting.  Musculoskeletal: Positive for myalgias, neck pain and neck stiffness. Negative for arthralgias, back pain and gait problem.  Skin: Negative for color change and wound.  Neurological: Negative for dizziness, seizures, syncope, weakness, light-headedness, numbness and headaches.     Physical Exam Triage Vital Signs ED Triage  Vitals  Enc Vitals Group     BP 05/22/17 1049 (!) 106/59     Pulse Rate 05/22/17 1049 (!) 52     Resp --      Temp 05/22/17 1049 98.2 F (36.8 C)     Temp Source 05/22/17 1049 Oral     SpO2 05/22/17 1049 99 %     Weight --      Height --      Head Circumference --      Peak Flow --      Pain Score 05/22/17 1047 10     Pain Loc --      Pain Edu? --      Excl. in GC? --    No data found.  Updated Vital Signs BP (!) 106/59   Pulse (!) 52   Temp 98.2 F (36.8 C) (Oral)   LMP 05/15/2017   SpO2 99%   Breastfeeding? No   Visual Acuity Right Eye Distance:   Left Eye Distance:   Bilateral Distance:    Right Eye Near:   Left Eye Near:    Bilateral Near:     Physical Exam  Constitutional: She appears well-developed and well-nourished. No distress.  HENT:  Head: Normocephalic and atraumatic.  Eyes: Conjunctivae are normal.  Neck: Neck supple.  Cardiovascular: Normal rate.  Pulmonary/Chest: Effort normal. No respiratory distress.  Musculoskeletal: She exhibits no edema.  Patient sitting with neck turned slightly rightward at rest.  Tenderness to palpation of upper thoracic left paraspinal muscles, no tenderness to palpation of cervical and thoracic spine midline.  Limited range of motion towards left.  Shoulders 5/5 strength equal bilaterally full range of motion at left shoulder shoulder.  Neurological: She is alert.  Skin: Skin is warm and dry.  Psychiatric: She has a normal mood and affect.  Nursing note and vitals reviewed.    UC Treatments / Results  Labs (all labs ordered are listed, but only abnormal results are displayed) Labs Reviewed - No data to display  EKG None Radiology No results found.  Procedures Procedures (including critical care time)  Medications Ordered in UC Medications  ketorolac (TORADOL) injection 60 mg (60 mg Intramuscular Given 05/22/17 1137)     Initial Impression / Assessment and Plan / UC Course  I have reviewed the triage  vital signs and the nursing notes.  Pertinent labs & imaging results that were available during my care of the patient were reviewed by me and considered in my medical decision making (see chart for details).     No injury of neck, will treat as neck strain/spasm.  Anti-inflammatories,  prednisone and Flexeril.  Patient endorses swelling with ibuprofen.  Will stick to Tylenol and add in prednisone.  Patient has tolerated Toradol previously, will provide in clinic today.  No neuro deficits.  No red flags.  Permethrin cream to treat for scabies given her children are also being treated.  Discussed strict return precautions. Patient verbalized understanding and is agreeable with plan.   Final Clinical Impressions(s) / UC Diagnoses   Final diagnoses:  Acute strain of neck muscle, initial encounter    ED Discharge Orders        Ordered    cyclobenzaprine (FLEXERIL) 10 MG tablet  2 times daily PRN     05/22/17 1124    acetaminophen (TYLENOL) 500 MG tablet  Every 6 hours PRN     05/22/17 1124    permethrin (ELIMITE) 5 % cream     05/22/17 1125    predniSONE (DELTASONE) 50 MG tablet  Daily     05/22/17 1125       Controlled Substance Prescriptions  Controlled Substance Registry consulted? Not Applicable   Lew Dawes, [place] 05/22/17 1146

## 2017-09-10 ENCOUNTER — Encounter (HOSPITAL_COMMUNITY): Payer: Self-pay | Admitting: *Deleted

## 2017-09-10 ENCOUNTER — Emergency Department (HOSPITAL_COMMUNITY)
Admission: EM | Admit: 2017-09-10 | Discharge: 2017-09-11 | Disposition: A | Discharge status: Home / Self Care | Payer: Medicare Other | Attending: Emergency Medicine | Admitting: Emergency Medicine

## 2017-09-10 ENCOUNTER — Other Ambulatory Visit: Payer: Self-pay

## 2017-09-10 DIAGNOSIS — T63441A Toxic effect of venom of bees, accidental (unintentional), initial encounter: Secondary | ICD-10-CM | POA: Insufficient documentation

## 2017-09-10 DIAGNOSIS — L299 Pruritus, unspecified: Secondary | ICD-10-CM | POA: Diagnosis not present

## 2017-09-10 DIAGNOSIS — L509 Urticaria, unspecified: Secondary | ICD-10-CM | POA: Diagnosis not present

## 2017-09-10 DIAGNOSIS — E161 Other hypoglycemia: Secondary | ICD-10-CM | POA: Diagnosis not present

## 2017-09-10 DIAGNOSIS — R070 Pain in throat: Secondary | ICD-10-CM | POA: Diagnosis not present

## 2017-09-10 DIAGNOSIS — Z79899 Other long term (current) drug therapy: Secondary | ICD-10-CM | POA: Diagnosis not present

## 2017-09-10 DIAGNOSIS — A6004 Herpesviral vulvovaginitis: Secondary | ICD-10-CM | POA: Insufficient documentation

## 2017-09-10 DIAGNOSIS — Z87891 Personal history of nicotine dependence: Secondary | ICD-10-CM | POA: Diagnosis not present

## 2017-09-10 DIAGNOSIS — J029 Acute pharyngitis, unspecified: Secondary | ICD-10-CM

## 2017-09-10 DIAGNOSIS — E162 Hypoglycemia, unspecified: Secondary | ICD-10-CM | POA: Diagnosis not present

## 2017-09-10 DIAGNOSIS — T7840XA Allergy, unspecified, initial encounter: Secondary | ICD-10-CM | POA: Diagnosis not present

## 2017-09-10 NOTE — ED Triage Notes (Signed)
Per [facility], pt was stung x 4 days ago by bees in the ground, c/o throat itching , ears ringing.  Also c/o dysuria.

## 2017-09-11 DIAGNOSIS — T63441A Toxic effect of venom of bees, accidental (unintentional), initial encounter: Secondary | ICD-10-CM | POA: Diagnosis not present

## 2017-09-11 LAB — GROUP A STREP BY PCR: GROUP A STREP BY PCR: NOT DETECTED

## 2017-09-11 MED ORDER — ACYCLOVIR 400 MG PO TABS: 800.0000 mg | ORAL_TABLET | Freq: Every day | ORAL | 0 refills | Status: DC

## 2017-09-11 MED ORDER — DIPHENHYDRAMINE HCL 25 MG PO CAPS
25.0000 mg | ORAL_CAPSULE | Freq: Once | ORAL | Status: AC
Start: 2017-09-11 — End: 2017-09-11
  Administered 2017-09-11: 25 mg via ORAL
  Filled 2017-09-11: qty 1

## 2017-09-11 MED ORDER — ACETAMINOPHEN 500 MG PO TABS
1000.0000 mg | ORAL_TABLET | Freq: Once | ORAL | Status: AC
  Administered 2017-09-11: 1000 mg via ORAL
  Filled 2017-09-11: qty 2

## 2017-09-11 NOTE — ED Provider Notes (Signed)
[facility]-EMERGENCY DEPT Provider Note   CSN: [id number] Arrival date & time: 09/10/17  2216     History   Chief Complaint Chief Complaint  Patient presents with  . Allergic Reaction    HPI [name] [name] is a 32 y.o. female with a hx of depression, fetal alcohol syndrome, migraine presents to the Emergency Department after being stung several times by yellow jackets.  Patient reports this happened 4 days ago.  She reports she felt normal until this afternoon when she developed a sore throat, generalized myalgias and mild, throbbing headache.  She denies fevers or chills.  She denies known sick contacts.  Patient reports the bee stings are healing without difficulty.  She denies ever having an allergic reaction to a bee sting.  No known other environmental allergies.  No new foods or medications.  No treatments prior to arrival.  Patient did not attempt to take Benadryl or any other medication.  Nothing seems to make it better or worse.  Additionally, patient is complaining of lesion to her vulva.  She does report a history of herpes.  She reports this started 2 days ago.  She denies vaginal discharge.  She does report dysuria however she reports that it is the specific site of the lesion that burns during urination.  She has been using a "cream" without significant relief.  No other treatments prior to arrival.  No alleviating factors.  Patient denies abdominal pain, fevers, chills, flank pain, back pain, nausea, vomiting, diarrhea.  The history is provided by the patient and medical records. No language interpreter was used.    Past Medical History:  Diagnosis Date  . Depression    post partum  . 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 History of chlamydia   . Migraine     Patient Active Problem List   Diagnosis Date Noted  . Chronic migraine 01/26/2017  . Paresthesia 01/26/2017  . Active labor 11/04/2013  . Normal delivery at  term 11/04/2013  . Normal delivery 06/04/2011    Past Surgical History:  Procedure Laterality Date  . APPENDECTOMY    . EYE SURGERY    . TYMPANOSTOMY TUBE PLACEMENT       OB History    Gravida  4   Para  4   Term  4   Preterm  0   AB  0   Living  4     SAB  0   TAB  0   Ectopic  0   Multiple  0   Live Births  4            Home Medications    Prior to Admission medications   Medication Sig Start Date End Date Taking? Authorizing Provider  acetaminophen (TYLENOL) 500 MG tablet Take 2 tablets (1,000 mg total) by mouth every 6 (six) hours as needed. 05/22/17   Wieters, Hallie C, PA-C  acyclovir (ZOVIRAX) 400 MG tablet Take 2 tablets (800 mg total) by mouth 5 (five) times daily. 09/11/17   Ayelet Gruenewald, Dahlia Client, PA-C  cyclobenzaprine (FLEXERIL) 10 MG tablet Take 1 tablet (10 mg total) by mouth 2 (two) times daily as needed for muscle spasms. 05/22/17   Wieters, Hallie C, PA-C  etonogestrel (NEXPLANON) 68 MG IMPL implant 1 each by Subdermal route once.    [provider]  fluticasone (FLONASE) 50 MCG/ACT nasal spray Place 1 spray into both nostrils daily.  03/05/14   [provider]  nortriptyline (PAMELOR) 10 MG capsule Take 2 capsules (20 mg total) by mouth at bedtime. 04/25/17   Levert FeinsteinYan, Yijun, MD  permethrin (ELIMITE) 5 % cream Apply to affected area once 05/22/17   Wieters, Hallie C, PA-C  rizatriptan (MAXALT-MLT) 10 MG disintegrating tablet Take 1 tablet (10 mg total) by mouth as needed. May repeat in 2 hours if needed 01/26/17   Levert FeinsteinYan, Yijun, MD    Family History Family History  Problem Relation Age of Onset  . Alcohol abuse Mother   . Seizures Father   . Alcohol abuse Father     Social History Social History   Tobacco Use  . Smoking status: Former Smoker    Types: Cigarettes  . Smokeless tobacco: Never Used  Substance Use Topics  . Alcohol use: Yes    Comment: Rare use  . Drug use: No     Allergies   Ibuprofen and Septra  [sulfamethoxazole-trimethoprim]   Review of Systems Review of Systems  Constitutional: Negative for appetite change, diaphoresis, fatigue, fever and unexpected weight change.  HENT: Positive for sore throat. Negative for mouth sores.   Eyes: Negative for visual disturbance.  Respiratory: Negative for cough, chest tightness, shortness of breath and wheezing.   Cardiovascular: Negative for chest pain.  Gastrointestinal: Negative for abdominal pain, constipation, diarrhea, nausea and vomiting.  Endocrine: Negative for polydipsia, polyphagia and polyuria.  Genitourinary: Positive for vaginal pain. Negative for dysuria, frequency, hematuria and urgency.  Musculoskeletal: Positive for myalgias. Negative for back pain and neck stiffness.  Skin: Positive for wound. Negative for rash.  Allergic/Immunologic: Negative for immunocompromised state.  Neurological: Positive for headaches. Negative for syncope and light-headedness.  Hematological: Does not bruise/bleed easily.  Psychiatric/Behavioral: Negative for sleep disturbance. The patient is not nervous/anxious.      Physical Exam Updated Vital Signs BP 119/60 (BP Location: Right Arm)   Pulse 93   Temp 98.8 F (37.1 C) (Oral)   Resp 14   Ht 5\' 3"  (1.6 m)   Wt 61.7 kg   LMP 08/27/2017 (Approximate)   SpO2 99%   BMI 24.09 kg/m   Physical Exam  Constitutional: She appears well-developed and well-nourished. No distress.  Awake, alert, nontoxic appearance  HENT:  Head: Normocephalic and atraumatic.  Right Ear: Tympanic membrane, external ear and ear canal normal.  Left Ear: Tympanic membrane, external ear and ear canal normal.  Nose: Mucosal edema present. No rhinorrhea. No epistaxis. Right sinus exhibits no maxillary sinus tenderness and no frontal sinus tenderness. Left sinus exhibits no maxillary sinus tenderness and no frontal sinus tenderness.  Mouth/Throat: Uvula is midline and mucous membranes are normal. Mucous membranes are not  pale and not cyanotic. No trismus in the jaw. No uvula swelling. Posterior oropharyngeal erythema present. No oropharyngeal exudate, posterior oropharyngeal edema or tonsillar abscesses.  Very mild erythema of the posterior oropharyngeal tissues but no edema, tonsillar swelling or exudate.  No swelling of the uvula.  Eyes: Pupils are equal, round, and reactive to light. Conjunctivae are normal. No scleral icterus.  Neck: Normal range of motion and full passive range of motion without pain. Neck supple.  Patient handling secretions without difficulty.  Normal phonation.  Cardiovascular: Normal rate, regular rhythm and intact distal pulses.  Pulmonary/Chest: Effort normal and breath sounds normal. No stridor. No respiratory distress. She has no wheezes.  Equal chest expansion  Abdominal: Soft. Bowel sounds are normal. She exhibits no mass. There is no tenderness. There is no rebound and no guarding. Hernia confirmed negative in the  right inguinal area and confirmed negative in the left inguinal area.  Genitourinary: Pelvic exam was performed with patient supine. No labial fusion. There is lesion on the right labia. There is no rash, tenderness or injury on the right labia. There is no rash, tenderness, lesion or injury on the left labia.  Genitourinary Comments: Small painful ulcerative lesion to the right labia consistent with herpes  Musculoskeletal: Normal range of motion. She exhibits no edema.  Lymphadenopathy:    She has no cervical adenopathy. No inguinal adenopathy noted on the right side.  Neurological: She is alert.  Speech is clear and goal oriented Moves extremities without ataxia  Skin: Skin is warm and dry. No rash noted. She is not diaphoretic.  Numerous small sites of bee stings.  None of them are erythematous, indurated or warm.  No evidence of secondary infection.  Psychiatric: She has a normal mood and affect.  Nursing note and vitals reviewed.    ED Treatments / Results    Labs (all labs ordered are listed, but only abnormal results are displayed) Labs Reviewed  GROUP A STREP BY PCR    EKG None  Radiology No results found.  Procedures Procedures (including critical care time)  Medications Ordered in ED Medications  acetaminophen (TYLENOL) tablet 1,000 mg (1,000 mg Oral Given 09/11/17 0025)  diphenhydrAMINE (BENADRYL) capsule 25 mg (25 mg Oral Given 09/11/17 0141)     Initial Impression / Assessment and Plan / ED Course  I have reviewed the triage vital signs and the nursing notes.  Pertinent labs & imaging results that were available during my care of the patient were reviewed by me and considered in my medical decision making (see chart for details).  Clinical Course as of Sep 11 217  Mon Sep 11, 2017  0115 Pt continues to be concerned about her throat.  Will try benadryl.  Pt is currently eating without difficulty in the room   [HM]    Clinical Course User Index [HM] Chantal Worthey, Boyd KerbsHannah, PA-C    Presents with sore throat several days after bee stings.  She is very anxious about having an allergic reaction but is afraid to take Benadryl.  Mild erythema of the posterior oropharyngeal tissue.  Strep test was negative.  Patient then agreed to take Benadryl.  Patient with patent airway, no stridor, no significant swelling of the throat.  No wheezing.  No hives.  No evidence of allergic reaction.  Patient is eating and drinking normally in the room without difficulty.  Bee sting sites evaluated.  No evidence of secondary infection.  Recently, patient with herpes outbreak of her labia.  Will give acyclovir.  2:19 AM Reports she is feeling better after the Benadryl.  I recommended use of this at home if she has additional throat symptoms.  She is well-appearing and remains without stridor, airway compromise or difficulty swallowing.  Final Clinical Impressions(s) / ED Diagnoses   Final diagnoses:  Bee sting, accidental or unintentional,  initial encounter  Sore throat  Herpes simplex vulvovaginitis    ED Discharge Orders         Ordered    acyclovir (ZOVIRAX) 400 MG tablet  5 times daily     09/11/17 0209           Teddi Badalamenti, Dahlia ClientHannah, PA-C 09/11/17 [id number]    Zadie RhineWickline, Donald, MD 09/11/17 [id number]

## 2017-09-11 NOTE — Discharge Instructions (Addendum)
1. Medications: Acyclovir, usual home medications 2. Treatment: rest, drink plenty of fluids,  3. Follow Up: Please followup with your primary doctor in 2-3 days for discussion of your diagnoses and further evaluation after today's visit; if you do not have a primary care doctor use the resource guide provided to find one; Please return to the ER for worsening swelling or pain in your throat, high fevers, vomiting, signs of infection or other concerns.

## 2017-10-06 ENCOUNTER — Encounter (HOSPITAL_COMMUNITY): Payer: Self-pay | Admitting: Emergency Medicine

## 2017-10-06 ENCOUNTER — Ambulatory Visit (HOSPITAL_COMMUNITY)
Admission: EM | Admit: 2017-10-06 | Discharge: 2017-10-06 | Disposition: A | Discharge status: Home / Self Care | Payer: Medicare Other | Attending: Emergency Medicine | Admitting: Emergency Medicine

## 2017-10-06 DIAGNOSIS — Z886 Allergy status to analgesic agent status: Secondary | ICD-10-CM | POA: Diagnosis not present

## 2017-10-06 DIAGNOSIS — B86 Scabies: Secondary | ICD-10-CM | POA: Insufficient documentation

## 2017-10-06 DIAGNOSIS — Z79899 Other long term (current) drug therapy: Secondary | ICD-10-CM | POA: Diagnosis not present

## 2017-10-06 DIAGNOSIS — Z202 Contact with and (suspected) exposure to infections with a predominantly sexual mode of transmission: Secondary | ICD-10-CM | POA: Diagnosis not present

## 2017-10-06 DIAGNOSIS — Z811 Family history of alcohol abuse and dependence: Secondary | ICD-10-CM | POA: Diagnosis not present

## 2017-10-06 DIAGNOSIS — G43909 Migraine, unspecified, not intractable, without status migrainosus: Secondary | ICD-10-CM | POA: Insufficient documentation

## 2017-10-06 DIAGNOSIS — Z882 Allergy status to sulfonamides status: Secondary | ICD-10-CM | POA: Diagnosis not present

## 2017-10-06 DIAGNOSIS — Z7951 Long term (current) use of inhaled steroids: Secondary | ICD-10-CM | POA: Diagnosis not present

## 2017-10-06 DIAGNOSIS — Z113 Encounter for screening for infections with a predominantly sexual mode of transmission: Secondary | ICD-10-CM | POA: Diagnosis not present

## 2017-10-06 DIAGNOSIS — R21 Rash and other nonspecific skin eruption: Secondary | ICD-10-CM | POA: Diagnosis present

## 2017-10-06 DIAGNOSIS — F79 Unspecified intellectual disabilities: Secondary | ICD-10-CM | POA: Insufficient documentation

## 2017-10-06 DIAGNOSIS — Z87891 Personal history of nicotine dependence: Secondary | ICD-10-CM | POA: Insufficient documentation

## 2017-10-06 MED ORDER — HYDROXYZINE HCL 50 MG PO TABS: 50.0000 mg | ORAL_TABLET | Freq: Every evening | ORAL | 0 refills | Status: DC | PRN

## 2017-10-06 MED ORDER — PERMETHRIN 5 % EX CREA: TOPICAL_CREAM | CUTANEOUS | 1 refills | Status: DC

## 2017-10-06 NOTE — ED Provider Notes (Signed)
[facility]    CSN: [id number] Arrival date & time: 10/06/17  0947     History   Chief Complaint Chief Complaint  Patient presents with  . Exposure to STD  . Rash    HPI [name] [name] is a 32 y.o. female.   She also requests STI testing. She does not have symptoms aside from the itching, but she does have concerns about STIs.  The history is provided by the patient.  Rash  Location:  Full body Quality: itchiness   Severity:  Severe Onset quality:  Gradual Timing:  Constant Progression:  Worsening Chronicity:  New Context comment:  Her mother "has some stuff going on" at her house- bed bugs maybe- and she sat on a sofa there Relieved by:  Nothing Worsened by:  Nothing Ineffective treatments:  Anti-itch cream Associated symptoms: no abdominal pain, no fever, no joint pain, no shortness of breath, no sore throat, no throat swelling, no tongue swelling, not vomiting and not wheezing     Past Medical History:  Diagnosis Date  . Depression    post partum  . 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 History of chlamydia   . Migraine     Patient Active Problem List   Diagnosis Date Noted  . Chronic migraine 01/26/2017  . Paresthesia 01/26/2017  . Active labor 11/04/2013  . Normal delivery at term 11/04/2013  . Normal delivery 06/04/2011    Past Surgical History:  Procedure Laterality Date  . APPENDECTOMY    . EYE SURGERY    . TYMPANOSTOMY TUBE PLACEMENT      OB History    Gravida  4   Para  4   Term  4   Preterm  0   AB  0   Living  4     SAB  0   TAB  0   Ectopic  0   Multiple  0   Live Births  4            Home Medications    Prior to Admission medications   Medication Sig Start Date End Date Taking? Authorizing Provider  acetaminophen (TYLENOL) 500 MG tablet Take 2 tablets (1,000 mg total) by mouth every 6 (six) hours as needed. 05/22/17   Wieters, Hallie C, PA-C  acyclovir (ZOVIRAX) 400  MG tablet Take 2 tablets (800 mg total) by mouth 5 (five) times daily. 09/11/17   Muthersbaugh, Dahlia ClientHannah, PA-C  cyclobenzaprine (FLEXERIL) 10 MG tablet Take 1 tablet (10 mg total) by mouth 2 (two) times daily as needed for muscle spasms. 05/22/17   Wieters, Hallie C, PA-C  etonogestrel (NEXPLANON) 68 MG IMPL implant 1 each by Subdermal route once.    [provider]  fluticasone (FLONASE) 50 MCG/ACT nasal spray Place 1 spray into both nostrils daily.  03/05/14   [provider]  nortriptyline (PAMELOR) 10 MG capsule Take 2 capsules (20 mg total) by mouth at bedtime. 04/25/17   Levert FeinsteinYan, Yijun, MD  permethrin (ELIMITE) 5 % cream Apply to affected area once 10/06/17   Marshell LevanMonroe, Corabelle Spackman G, MD  rizatriptan (MAXALT-MLT) 10 MG disintegrating tablet Take 1 tablet (10 mg total) by mouth as needed. May repeat in 2 hours if needed 01/26/17   Levert FeinsteinYan, Yijun, MD    Family History Family History  Problem Relation Age of Onset  . Alcohol abuse Mother   . Seizures Father   . Alcohol abuse Father     Social History  Social History   Tobacco Use  . Smoking status: Former Smoker    Types: Cigarettes  . Smokeless tobacco: Never Used  Substance Use Topics  . Alcohol use: Yes    Comment: Rare use  . Drug use: No     Allergies   Ibuprofen and Septra [sulfamethoxazole-trimethoprim]   Review of Systems Review of Systems  Constitutional: Negative for chills and fever.  HENT: Negative for ear pain and sore throat.   Eyes: Negative for pain and visual disturbance.  Respiratory: Negative for cough, shortness of breath and wheezing.   Cardiovascular: Negative for chest pain and palpitations.  Gastrointestinal: Negative for abdominal pain and vomiting.  Genitourinary: Negative for decreased urine volume, difficulty urinating, dysuria, frequency, genital sores, hematuria, urgency, vaginal bleeding, vaginal discharge and vaginal pain.  Musculoskeletal: Negative for arthralgias and back pain.  Skin: Positive  for rash. Negative for color change.  Neurological: Negative for seizures and syncope.  All other systems reviewed and are negative.    Physical Exam Triage Vital Signs ED Triage Vitals [10/06/17 1041]  Enc Vitals Group     BP 114/80     Pulse Rate 67     Resp 18     Temp 97.7 F (36.5 C)     Temp Source Oral     SpO2 100 %     Weight      Height      Head Circumference      Peak Flow      Pain Score      Pain Loc      Pain Edu?      Excl. in GC?    No data found.  Updated Vital Signs BP 114/80 (BP Location: Right Arm)   Pulse 67   Temp 97.7 F (36.5 C) (Oral)   Resp 18   SpO2 100%   Visual Acuity Right Eye Distance:   Left Eye Distance:   Bilateral Distance:    Right Eye Near:   Left Eye Near:    Bilateral Near:     Physical Exam  Constitutional: She is oriented to person, place, and time. She appears well-developed and well-nourished.  She is itching furiously  HENT:  Head: Normocephalic and atraumatic.  Eyes: Conjunctivae are normal.  Pulmonary/Chest: Effort normal. No respiratory distress.  Musculoskeletal: She exhibits no edema or deformity.  Neurological: She is alert and oriented to person, place, and time.  Skin: Rash: small papules with central burroughs over upper arms, upper back, legs.  Psychiatric: She has a normal mood and affect. Her behavior is normal.     UC Treatments / Results  Labs (all labs ordered are listed, but only abnormal results are displayed) Labs Reviewed  RPR  HIV ANTIBODY (ROUTINE TESTING W REFLEX)  URINE CYTOLOGY ANCILLARY ONLY    EKG None  Radiology No results found.  Procedures Procedures (including critical care time)  Medications Ordered in UC Medications - No data to display  Initial Impression / Assessment and Plan / UC Course  I have reviewed the triage vital signs and the nursing notes.  Pertinent labs & imaging results that were available during my care of the patient were reviewed by me and  considered in my medical decision making (see chart for details).     Rash is most consistent with scabies.  Treatment as well as decontamination was explained.  She will also be tested for sexually transmitted infection as requested.  Results will be called to her. Final Clinical Impressions(s) /  UC Diagnoses   Final diagnoses:  Exposure to STD  Scabies infestation   Discharge Instructions   None    ED Prescriptions    Medication Sig Dispense Auth. Provider   permethrin (ELIMITE) 5 % cream Apply to affected area once 60 g Marshell Levan, MD     Controlled Substance Prescriptions [place] Controlled Substance Registry consulted?   Marshell Levan, MD 10/06/17 1109

## 2017-10-06 NOTE — ED Triage Notes (Signed)
Pt request STD screening and has itchy rash to back

## 2017-10-07 LAB — RPR: RPR Ser Ql: NONREACTIVE

## 2017-10-07 LAB — HIV ANTIBODY (ROUTINE TESTING W REFLEX): HIV Screen 4th Generation wRfx: NONREACTIVE

## 2017-10-09 LAB — URINE CYTOLOGY ANCILLARY ONLY
Chlamydia: NEGATIVE
NEISSERIA GONORRHEA: NEGATIVE
TRICH (WINDOWPATH): NEGATIVE

## 2017-10-10 LAB — URINE CYTOLOGY ANCILLARY ONLY
BACTERIAL VAGINITIS: NEGATIVE
Candida vaginitis: NEGATIVE

## 2017-11-22 ENCOUNTER — Encounter (HOSPITAL_COMMUNITY): Payer: Self-pay | Admitting: Emergency Medicine

## 2017-11-22 ENCOUNTER — Emergency Department (HOSPITAL_COMMUNITY): Payer: Medicare Other

## 2017-11-22 ENCOUNTER — Emergency Department (HOSPITAL_COMMUNITY)
Admission: EM | Admit: 2017-11-22 | Discharge: 2017-11-22 | Disposition: A | Discharge status: Home / Self Care | Payer: Medicare Other | Attending: Emergency Medicine | Admitting: Emergency Medicine

## 2017-11-22 DIAGNOSIS — B9789 Other viral agents as the cause of diseases classified elsewhere: Secondary | ICD-10-CM | POA: Diagnosis not present

## 2017-11-22 DIAGNOSIS — Z87891 Personal history of nicotine dependence: Secondary | ICD-10-CM | POA: Insufficient documentation

## 2017-11-22 DIAGNOSIS — J069 Acute upper respiratory infection, unspecified: Secondary | ICD-10-CM | POA: Insufficient documentation

## 2017-11-22 DIAGNOSIS — R079 Chest pain, unspecified: Secondary | ICD-10-CM | POA: Diagnosis not present

## 2017-11-22 DIAGNOSIS — R05 Cough: Secondary | ICD-10-CM | POA: Diagnosis not present

## 2017-11-22 DIAGNOSIS — Z79899 Other long term (current) drug therapy: Secondary | ICD-10-CM | POA: Diagnosis not present

## 2017-11-22 DIAGNOSIS — R0602 Shortness of breath: Secondary | ICD-10-CM | POA: Diagnosis not present

## 2017-11-22 DIAGNOSIS — M62830 Muscle spasm of back: Secondary | ICD-10-CM | POA: Insufficient documentation

## 2017-11-22 DIAGNOSIS — R52 Pain, unspecified: Secondary | ICD-10-CM | POA: Diagnosis not present

## 2017-11-22 MED ORDER — CYCLOBENZAPRINE HCL 5 MG PO TABS: 5.0000 mg | ORAL_TABLET | Freq: Two times a day (BID) | ORAL | 0 refills | Status: DC | PRN

## 2017-11-22 NOTE — ED Provider Notes (Signed)
Patient placed in Quick Look pathway, seen and evaluated   Chief Complaint: pain in left upper back and cough HPI:   Pt reports she has had a cough for 3 weeks.  Pain in left scapula when getting out of bed today  ROS: no fever  Physical Exam:   Gen: No distress  Neuro: Awake and Alert  Skin: Warm    Focused Exam: Lungs clear, heart rrr,  Tender left scapula    Initiation of care has begun. The patient has been counseled on the process, plan, and necessity for staying for the completion/evaluation, and the remainder of the medical screening examination   Osie Cheeks 11/22/17 1333    Gerhard Munch, MD 11/22/17 1517

## 2017-11-22 NOTE — Discharge Instructions (Signed)
Take tylenol for pain. Use the muscle relaxer. Return to the ER if you develop fever , shortness of breath, bloody cough.

## 2017-11-22 NOTE — ED Triage Notes (Signed)
Pt reports pain in left upper and mid back, also reports a productive cough with clear sputum for 1 week. Pt denies any cp or sob.

## 2017-11-22 NOTE — ED Provider Notes (Signed)
[facility] EMERGENCY DEPARTMENT Provider Note   CSN: [id number] Arrival date & time: 11/22/17  1328     History   Chief Complaint Chief Complaint  Patient presents with  . Back Pain    HPI [name] [name] is a 32 y.o. female.  With a past medical history of fetal alcohol syndrome who presents the emergency department with chief complaint of back pain.  Patient has had a cough for 1 week.  Today she coughed and she had a sharp pain in her left upper back between her shoulder blade and her spine.  She is a hurts for her to move the shoulder blade or take a deep breath.  She denies hemoptysis, unilateral leg swelling.  She does not take any exogenous estrogens does not smoke.  HPI  Past Medical History:  Diagnosis Date  . Depression    post partum  . 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 History of chlamydia   . Migraine     Patient Active Problem List   Diagnosis Date Noted  . Chronic migraine 01/26/2017  . Paresthesia 01/26/2017  . Active labor 11/04/2013  . Normal delivery at term 11/04/2013  . Normal delivery 06/04/2011    Past Surgical History:  Procedure Laterality Date  . APPENDECTOMY    . EYE SURGERY    . TYMPANOSTOMY TUBE PLACEMENT       OB History    Gravida  4   Para  4   Term  4   Preterm  0   AB  0   Living  4     SAB  0   TAB  0   Ectopic  0   Multiple  0   Live Births  4            Home Medications    Prior to Admission medications   Medication Sig Start Date End Date Taking? Authorizing Provider  acetaminophen (TYLENOL) 500 MG tablet Take 2 tablets (1,000 mg total) by mouth every 6 (six) hours as needed. 05/22/17   Wieters, Hallie C, PA-C  acyclovir (ZOVIRAX) 400 MG tablet Take 2 tablets (800 mg total) by mouth 5 (five) times daily. 09/11/17   Muthersbaugh, Dahlia Client, PA-C  cyclobenzaprine (FLEXERIL) 10 MG tablet Take 1 tablet (10 mg total) by mouth 2 (two) times daily as needed  for muscle spasms. 05/22/17   Wieters, Hallie C, PA-C  etonogestrel (NEXPLANON) 68 MG IMPL implant 1 each by Subdermal route once.    [provider]  fluticasone (FLONASE) 50 MCG/ACT nasal spray Place 1 spray into both nostrils daily.  03/05/14   [provider]  hydrOXYzine (ATARAX/VISTARIL) 50 MG tablet Take 1 tablet (50 mg total) by mouth at bedtime as needed. 10/06/17   Marshell Levan, MD  nortriptyline (PAMELOR) 10 MG capsule Take 2 capsules (20 mg total) by mouth at bedtime. 04/25/17   Levert Feinstein, MD  permethrin (ELIMITE) 5 % cream Apply to affected area once 10/06/17   Marshell Levan, MD  rizatriptan (MAXALT-MLT) 10 MG disintegrating tablet Take 1 tablet (10 mg total) by mouth as needed. May repeat in 2 hours if needed 01/26/17   Levert Feinstein, MD    Family History Family History  Problem Relation Age of Onset  . Alcohol abuse Mother   . Seizures Father   . Alcohol abuse Father     Social History Social History   Tobacco Use  . Smoking status:  Former Smoker    Types: Cigarettes  . Smokeless tobacco: Never Used  Substance Use Topics  . Alcohol use: Yes    Comment: Rare use  . Drug use: No     Allergies   Ibuprofen and Septra [sulfamethoxazole-trimethoprim]   Review of Systems Review of Systems  Ten systems reviewed and are negative for acute change, except as noted in the HPI.   Physical Exam Updated Vital Signs BP 102/65 (BP Location: Right Arm)   Pulse 62   Temp 98.2 F (36.8 C) (Oral)   Resp 16   SpO2 100%   Physical Exam  Constitutional: She is oriented to person, place, and time. She appears well-developed and well-nourished. No distress.  HENT:  Head: Normocephalic and atraumatic.  Eyes: Conjunctivae are normal. No scleral icterus.  Neck: Normal range of motion.  Cardiovascular: Normal rate, regular rhythm and normal heart sounds. Exam reveals no gallop and no friction rub.  No murmur heard. Pulmonary/Chest: Effort normal and breath sounds  normal. No respiratory distress. She has no wheezes. She has no rales.  Abdominal: Soft. Bowel sounds are normal. She exhibits no distension and no mass. There is no tenderness. There is no guarding.  Musculoskeletal:  Patient with point tenderness in the left scapula.  Pain is reproducible.  She has spastic tissue.  Pain with range of motion of the shoulder blade.  Neurological: She is alert and oriented to person, place, and time.  Skin: Skin is warm and dry. She is not diaphoretic.  Psychiatric: Her behavior is normal.  Nursing note and vitals reviewed.    ED Treatments / Results  Labs (all labs ordered are listed, but only abnormal results are displayed) Labs Reviewed - No data to display  EKG None  Radiology Dg Chest 2 View  Result Date: 11/22/2017 CLINICAL DATA:  Acute onset of shortness of breath. Productive cough and left-sided chest pain. EXAM: CHEST - 2 VIEW COMPARISON:  Chest radiograph performed 02/12/2016 FINDINGS: The lungs are well-aerated. Minimal atelectasis is noted at the right midlung zone. There is no evidence of pleural effusion or pneumothorax. The heart is normal in size; the mediastinal contour is within normal limits. No acute osseous abnormalities are seen. IMPRESSION: Minimal atelectasis at the right midlung zone. Lungs otherwise clear. Electronically Signed   By: Roanna Raider M.D.   On: 11/22/2017 13:49    Procedures Procedures (including critical care time)  Medications Ordered in ED Medications - No data to display   Initial Impression / Assessment and Plan / ED Course  I have reviewed the triage vital signs and the nursing notes.  Pertinent labs & imaging results that were available during my care of the patient were reviewed by me and considered in my medical decision making (see chart for details).     With acute muscle spasm of the upper back region.  She is PERC negative.  I have no suspicion for pulmonary embolus.  I was able to perform  manual trigger point therapy with significant improvement in her range of motion.  Final Clinical Impressions(s) / ED Diagnoses   Final diagnoses:  Viral URI with cough  Spasm of muscle, back    ED Discharge Orders    None       Arthor Captain, PA-C 11/22/17 1504    Linwood Dibbles, MD 11/24/17 [phone]

## 2017-12-14 ENCOUNTER — Encounter (HOSPITAL_COMMUNITY): Payer: Self-pay | Admitting: Emergency Medicine

## 2017-12-14 ENCOUNTER — Ambulatory Visit (HOSPITAL_COMMUNITY)
Admission: EM | Admit: 2017-12-14 | Discharge: 2017-12-14 | Disposition: A | Discharge status: Home / Self Care | Payer: Medicare Other | Attending: Family Medicine | Admitting: Family Medicine

## 2017-12-14 DIAGNOSIS — N760 Acute vaginitis: Secondary | ICD-10-CM | POA: Diagnosis not present

## 2017-12-14 MED ORDER — FLUCONAZOLE 150 MG PO TABS: 150.0000 mg | ORAL_TABLET | Freq: Every day | ORAL | 0 refills | Status: DC

## 2017-12-14 NOTE — Discharge Instructions (Addendum)
I believe this is a vaginal yeast infection We will treat this with fluconazole.  He will take 1 tab today and then another tab in 3 days if still having symptoms He can use over-the-counter vaginal external cream for soothing relief Follow up as needed for continued or worsening symptoms

## 2017-12-14 NOTE — ED Triage Notes (Signed)
Pt c/o vaginal itching and swelling "down there" x2 days.

## 2017-12-14 NOTE — ED Provider Notes (Signed)
[facility]    CSN: [id number] Arrival date & time: 12/14/17  1029     History   Chief Complaint Chief Complaint  Patient presents with  . Vaginal Itching    HPI [name] [name] is a 32 y.o. female.   Patient is a 32 year old female that presents with 2 days of vaginal itching, irritation, swelling, erythema.  She is use over-the-counter Monistat without any relief of symptoms.  Her symptoms have been constant and run the same.  She did admit to using a new soap prior to this starting.  She denies any associated abdominal pain, back pain, pelvic pain, dysuria, hematuria, urinary frequency.  She is not currently sexually active and denies any concern for STDs.  ROS per HPI    Vaginal Itching     Past Medical History:  Diagnosis Date  . Depression    post partum  . 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 History of chlamydia   . Migraine     Patient Active Problem List   Diagnosis Date Noted  . Chronic migraine 01/26/2017  . Paresthesia 01/26/2017  . Active labor 11/04/2013  . Normal delivery at term 11/04/2013  . Normal delivery 06/04/2011    Past Surgical History:  Procedure Laterality Date  . APPENDECTOMY    . EYE SURGERY    . TYMPANOSTOMY TUBE PLACEMENT      OB History    Gravida  4   Para  4   Term  4   Preterm  0   AB  0   Living  4     SAB  0   TAB  0   Ectopic  0   Multiple  0   Live Births  4            Home Medications    Prior to Admission medications   Medication Sig Start Date End Date Taking? Authorizing Provider  acetaminophen (TYLENOL) 500 MG tablet Take 2 tablets (1,000 mg total) by mouth every 6 (six) hours as needed. Patient not taking: Reported on 12/14/2017 05/22/17   Wieters, Hallie C, PA-C  acyclovir (ZOVIRAX) 400 MG tablet Take 2 tablets (800 mg total) by mouth 5 (five) times daily. Patient not taking: Reported on 12/14/2017 09/11/17   Muthersbaugh, Dahlia Client, PA-C    cyclobenzaprine (FLEXERIL) 5 MG tablet Take 1 tablet (5 mg total) by mouth 2 (two) times daily as needed for muscle spasms. Patient not taking: Reported on 12/14/2017 11/22/17   Arthor Captain, PA-C  etonogestrel (NEXPLANON) 68 MG IMPL implant 1 each by Subdermal route once.     [provider]  fluconazole (DIFLUCAN) 150 MG tablet Take 1 tablet (150 mg total) by mouth daily. 12/14/17   Brena Windsor, Gloris Manchester A, NP  fluticasone (FLONASE) 50 MCG/ACT nasal spray Place 1 spray into both nostrils daily.  03/05/14   [provider]  hydrOXYzine (ATARAX/VISTARIL) 50 MG tablet Take 1 tablet (50 mg total) by mouth at bedtime as needed. Patient not taking: Reported on 12/14/2017 10/06/17   Marshell Levan, MD  nortriptyline (PAMELOR) 10 MG capsule Take 2 capsules (20 mg total) by mouth at bedtime. Patient not taking: Reported on 12/14/2017 04/25/17   Levert Feinstein, MD  permethrin [place]) 5 % cream Apply to affected area once 10/06/17   Marshell Levan, MD  rizatriptan (MAXALT-MLT) 10 MG disintegrating tablet Take 1 tablet (10 mg total) by mouth as needed. May repeat in 2 hours if needed  Patient not taking: Reported on 12/14/2017 01/26/17   Levert FeinsteinYan, Yijun, MD    Family History Family History  Problem Relation Age of Onset  . Alcohol abuse Mother   . Seizures Father   . Alcohol abuse Father     Social History Social History   Tobacco Use  . Smoking status: Former Smoker    Types: Cigarettes  . Smokeless tobacco: Never Used  Substance Use Topics  . Alcohol use: Yes    Comment: Rare use  . Drug use: No     Allergies   Ibuprofen and Septra [sulfamethoxazole-trimethoprim]   Review of Systems Review of Systems   Physical Exam Triage Vital Signs ED Triage Vitals  Enc Vitals Group     BP 12/14/17 1122 111/61     Pulse Rate 12/14/17 1122 100     Resp 12/14/17 1122 18     Temp 12/14/17 1122 98.1 F (36.7 C)     Temp src --      SpO2 12/14/17 1122 99 %     Weight --      Height --       Head Circumference --      Peak Flow --      Pain Score 12/14/17 1123 0     Pain Loc --      Pain Edu? --      Excl. in GC? --    No data found.  Updated Vital Signs BP 111/61   Pulse 100   Temp 98.1 F (36.7 C)   Resp 18   SpO2 99%   Visual Acuity Right Eye Distance:   Left Eye Distance:   Bilateral Distance:    Right Eye Near:   Left Eye Near:    Bilateral Near:     Physical Exam  Constitutional: She appears well-developed and well-nourished.  Neck: Normal range of motion.  Pulmonary/Chest: Effort normal.  Abdominal: Soft.  Genitourinary:  Genitourinary Comments: Deferred patient not having any abdominal pain, back pain, suspicious lesions  Musculoskeletal: Normal range of motion.  Neurological: She is alert.  Skin: Skin is warm and dry.  Psychiatric: She has a normal mood and affect.  Nursing note and vitals reviewed.    UC Treatments / Results  Labs (all labs ordered are listed, but only abnormal results are displayed) Labs Reviewed - No data to display  EKG None  Radiology No results found.  Procedures Procedures (including critical care time)  Medications Ordered in UC Medications - No data to display  Initial Impression / Assessment and Plan / UC Course  I have reviewed the triage vital signs and the nursing notes.  Pertinent labs & imaging results that were available during my care of the patient were reviewed by me and considered in my medical decision making (see chart for details).     We will go ahead and treat patient for yeast infection based on symptoms with Diflucan Follow up as needed for continued or worsening symptoms  Final Clinical Impressions(s) / UC Diagnoses   Final diagnoses:  Acute vaginitis     Discharge Instructions     I believe this is a vaginal yeast infection We will treat this with fluconazole.  He will take 1 tab today and then another tab in 3 days if still having symptoms He can use over-the-counter  vaginal external cream for soothing relief Follow up as needed for continued or worsening symptoms     ED Prescriptions    Medication Sig Dispense Auth.  Provider   fluconazole (DIFLUCAN) 150 MG tablet Take 1 tablet (150 mg total) by mouth daily. 2 tablet Dahlia Byes A, NP     Controlled Substance Prescriptions [place] Controlled Substance Registry consulted? Not Applicable   Janace Aris, NP 12/14/17 1752

## 2018-02-02 ENCOUNTER — Encounter (HOSPITAL_COMMUNITY): Payer: Self-pay

## 2018-02-02 ENCOUNTER — Other Ambulatory Visit: Payer: Self-pay

## 2018-02-02 ENCOUNTER — Emergency Department (HOSPITAL_COMMUNITY)
Admission: EM | Admit: 2018-02-02 | Discharge: 2018-02-03 | Disposition: A | Discharge status: Home / Self Care | Payer: Medicare Other | Attending: Emergency Medicine | Admitting: Emergency Medicine

## 2018-02-02 DIAGNOSIS — Z87891 Personal history of nicotine dependence: Secondary | ICD-10-CM | POA: Diagnosis not present

## 2018-02-02 DIAGNOSIS — R1084 Generalized abdominal pain: Secondary | ICD-10-CM | POA: Insufficient documentation

## 2018-02-02 DIAGNOSIS — R112 Nausea with vomiting, unspecified: Secondary | ICD-10-CM | POA: Insufficient documentation

## 2018-02-02 DIAGNOSIS — Z79899 Other long term (current) drug therapy: Secondary | ICD-10-CM | POA: Insufficient documentation

## 2018-02-02 DIAGNOSIS — R109 Unspecified abdominal pain: Secondary | ICD-10-CM | POA: Diagnosis not present

## 2018-02-02 LAB — URINALYSIS, ROUTINE W REFLEX MICROSCOPIC
Bacteria, UA: NONE SEEN
Bilirubin Urine: NEGATIVE
Glucose, UA: NEGATIVE mg/dL
Hgb urine dipstick: NEGATIVE
Ketones, ur: 5 mg/dL — AB
Nitrite: NEGATIVE
PROTEIN: NEGATIVE mg/dL
Specific Gravity, Urine: 1.027 (ref 1.005–1.030)
pH: 5 (ref 5.0–8.0)

## 2018-02-02 LAB — COMPREHENSIVE METABOLIC PANEL
ALBUMIN: 4.4 g/dL (ref 3.5–5.0)
ALT: 17 U/L (ref 0–44)
AST: 20 U/L (ref 15–41)
Alkaline Phosphatase: 66 U/L (ref 38–126)
Anion gap: 10 (ref 5–15)
BUN: 10 mg/dL (ref 6–20)
CO2: 24 mmol/L (ref 22–32)
Calcium: 8.6 mg/dL — ABNORMAL LOW (ref 8.9–10.3)
Chloride: 105 mmol/L (ref 98–111)
Creatinine, Ser: 0.88 mg/dL (ref 0.44–1.00)
GFR calc Af Amer: >60 mL/min (ref >60)
GFR calc non Af Amer: >60 mL/min (ref >60)
GLUCOSE: 116 mg/dL — AB (ref 70–99)
Potassium: 3.3 mmol/L — ABNORMAL LOW (ref 3.5–5.1)
Sodium: 139 mmol/L (ref 135–145)
Total Bilirubin: 0.9 mg/dL (ref 0.3–1.2)
Total Protein: 8.1 g/dL (ref 6.5–8.1)

## 2018-02-02 LAB — CBC
HEMATOCRIT: 39.9 % (ref 36.0–46.0)
Hemoglobin: 12.7 g/dL (ref 12.0–15.0)
MCH: 27.5 pg (ref 26.0–34.0)
MCHC: 31.8 g/dL (ref 30.0–36.0)
MCV: 86.4 fL (ref 80.0–100.0)
Platelets: 318 10*3/uL (ref 150–400)
RBC: 4.62 MIL/uL (ref 3.87–5.11)
RDW: 12.7 % (ref 11.5–15.5)
WBC: 9.8 10*3/uL (ref 4.0–10.5)
nRBC: 0 % (ref 0.0–0.2)

## 2018-02-02 LAB — I-STAT BETA HCG BLOOD, ED (MC, WL, AP ONLY)

## 2018-02-02 LAB — LIPASE, BLOOD: Lipase: 24 U/L (ref 11–51)

## 2018-02-02 MED ORDER — SODIUM CHLORIDE 0.9 % IV SOLN
INTRAVENOUS | Status: DC
  Administered 2018-02-02: 22:00:00 via INTRAVENOUS

## 2018-02-02 MED ORDER — ONDANSETRON HCL 4 MG/2ML IJ SOLN
4.0000 mg | Freq: Once | INTRAMUSCULAR | Status: AC
  Administered 2018-02-02: 4 mg via INTRAVENOUS
  Filled 2018-02-02: qty 2

## 2018-02-02 NOTE — ED Notes (Signed)
Pt tolerated apple juice PO without vomiting.

## 2018-02-02 NOTE — ED Triage Notes (Signed)
Pt has been having emesis since 0500. Pt states she has had "20-25" episodes. Pt states that she has had yellow and green emesis. Pt had some diarrhea yesterday, but not today. Pt states pain is in her lower abdomen, thighs, and back.

## 2018-02-02 NOTE — ED Provider Notes (Signed)
Patient taken in sign out from [facility], NP Patient here with fever, generalized abdominal pain nausea and vomiting. Her fever has resolved.  She has a benign abdominal exam, heart rate is improved, labs show no significant abnormality.  She is not having any active vomiting at this time and feels better.  I discussed return precautions with the patient feels she is appropriate for discharge at this time   Delos Haring 02/03/18 0703    Palumbo, April, MD 02/03/18 [phone]

## 2018-02-02 NOTE — ED Provider Notes (Signed)
[facility]-EMERGENCY DEPT Provider Note   CSN: [id number] Arrival date & time: 02/02/18  1811     History   Chief Complaint Chief Complaint  Patient presents with  . Emesis    HPI [name] [name] is a 33 y.o. female with hx of fetal alcohol syndrome who presents to the ED with n/v/d. The symptoms started yesterday with abdominal pain and diarrhea. Patient reports vomiting 20 to 25 times and now is yellow/green. Diarrhea was yesterday but none today.   The history is provided by the patient. No language interpreter was used.  Emesis  Severity:  Severe Duration:  24 hours Number of daily episodes:  20-25 Quality:  Stomach contents Progression:  Unchanged Chronicity:  New Recent urination:  Normal Relieved by:  Nothing Worsened by:  Nothing Ineffective treatments:  None tried Associated symptoms: abdominal pain, chills, fever and myalgias   Associated symptoms: no cough, no headaches, no sore throat and no URI     Past Medical History:  Diagnosis Date  . Depression    post partum  . 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 History of chlamydia   . Migraine     Patient Active Problem List   Diagnosis Date Noted  . Chronic migraine 01/26/2017  . Paresthesia 01/26/2017  . Active labor 11/04/2013  . Normal delivery at term 11/04/2013  . Normal delivery 06/04/2011    Past Surgical History:  Procedure Laterality Date  . APPENDECTOMY    . EYE SURGERY    . TYMPANOSTOMY TUBE PLACEMENT       OB History    Gravida  4   Para  4   Term  4   Preterm  0   AB  0   Living  4     SAB  0   TAB  0   Ectopic  0   Multiple  0   Live Births  4            Home Medications    Prior to Admission medications   Medication Sig Start Date End Date Taking? Authorizing Provider  acetaminophen (TYLENOL) 500 MG tablet Take 2 tablets (1,000 mg total) by mouth every 6 (six) hours as needed. Patient not taking:  Reported on 12/14/2017 05/22/17   Wieters, Hallie C, PA-C  acyclovir (ZOVIRAX) 400 MG tablet Take 2 tablets (800 mg total) by mouth 5 (five) times daily. Patient not taking: Reported on 12/14/2017 09/11/17   Muthersbaugh, Dahlia Client, PA-C  cyclobenzaprine (FLEXERIL) 5 MG tablet Take 1 tablet (5 mg total) by mouth 2 (two) times daily as needed for muscle spasms. Patient not taking: Reported on 12/14/2017 11/22/17   Arthor Captain, PA-C  etonogestrel (NEXPLANON) 68 MG IMPL implant 1 each by Subdermal route once.     [provider]  fluconazole (DIFLUCAN) 150 MG tablet Take 1 tablet (150 mg total) by mouth daily. 12/14/17   Bast, Gloris Manchester A, NP  fluticasone (FLONASE) 50 MCG/ACT nasal spray Place 1 spray into both nostrils daily.  03/05/14   [provider]  hydrOXYzine (ATARAX/VISTARIL) 50 MG tablet Take 1 tablet (50 mg total) by mouth at bedtime as needed. Patient not taking: Reported on 12/14/2017 10/06/17   Marshell Levan, MD  nortriptyline (PAMELOR) 10 MG capsule Take 2 capsules (20 mg total) by mouth at bedtime. Patient not taking: Reported on 12/14/2017 04/25/17   Levert Feinstein, MD  permethrin (ELIMITE) 5 % cream Apply to affected area once  10/06/17   Marshell LevanMonroe, Anna G, MD  rizatriptan (MAXALT-MLT) 10 MG disintegrating tablet Take 1 tablet (10 mg total) by mouth as needed. May repeat in 2 hours if needed Patient not taking: Reported on 12/14/2017 01/26/17   Levert FeinsteinYan, Yijun, MD    Family History Family History  Problem Relation Age of Onset  . Alcohol abuse Mother   . Seizures Father   . Alcohol abuse Father     Social History Social History   Tobacco Use  . Smoking status: Former Smoker    Types: Cigarettes  . Smokeless tobacco: Never Used  Substance Use Topics  . Alcohol use: Yes    Comment: Rare use  . Drug use: No     Allergies   Ibuprofen and Septra [sulfamethoxazole-trimethoprim]   Review of Systems Review of Systems  Constitutional: Positive for chills and fever.    HENT: Negative for congestion and sore throat.   Eyes: Negative for visual disturbance.  Respiratory: Negative for cough.   Cardiovascular: Negative for chest pain.  Gastrointestinal: Positive for abdominal pain, nausea and vomiting.  Genitourinary: Negative for dysuria and urgency.  Musculoskeletal: Positive for myalgias.  Neurological: Negative for headaches.  Psychiatric/Behavioral: Negative for confusion.     Physical Exam Updated Vital Signs BP 110/76   Pulse 98   Temp 99.6 F (37.6 C)   Resp 16   Ht 5\' 2"  (1.575 m)   Wt 63.5 kg   LMP 01/19/2018   SpO2 99%   BMI 25.61 kg/m   Physical Exam Vitals signs and nursing note reviewed.  Constitutional:      General: She is not in acute distress.    Appearance: She is well-developed.  HENT:     Nose: No congestion.     Mouth/Throat:     Mouth: Mucous membranes are moist.  Eyes:     Extraocular Movements: Extraocular movements intact.     Conjunctiva/sclera: Conjunctivae normal.  Neck:     Musculoskeletal: Neck supple.  Cardiovascular:     Rate and Rhythm: Regular rhythm. Tachycardia present.  Pulmonary:     Effort: Pulmonary effort is normal.     Breath sounds: Normal breath sounds.  Abdominal:     General: Bowel sounds are normal.     Palpations: Abdomen is soft.     Tenderness: There is generalized abdominal tenderness. There is no right CVA tenderness, left CVA tenderness, guarding or rebound.  Musculoskeletal: Normal range of motion.  Skin:    General: Skin is warm and dry.  Neurological:     Mental Status: She is alert and oriented to person, place, and time.     Cranial Nerves: No cranial nerve deficit.  Psychiatric:        Mood and Affect: Mood normal.      ED Treatments / Results  Labs (all labs ordered are listed, but only abnormal results are displayed) Labs Reviewed  COMPREHENSIVE METABOLIC PANEL - Abnormal; Notable for the following components:      Result Value   Potassium 3.3 (*)     Glucose, Bld 116 (*)    Calcium 8.6 (*)    All other components within normal limits  LIPASE, BLOOD  CBC  URINALYSIS, ROUTINE W REFLEX MICROSCOPIC  I-STAT BETA HCG BLOOD, ED ([facility], [facility], [facility] ONLY)    Radiology No results found.  Procedures Procedures (including critical care time)  Medications Ordered in ED Medications  0.9 %  sodium chloride infusion ( Intravenous New Bag/Given 02/02/18 2214)  ondansetron (ZOFRAN) injection  4 mg (4 mg Intravenous Given 02/02/18 2213)     Initial Impression / Assessment and Plan / ED Course  I have reviewed the triage vital signs and the nursing notes.  Clinical Course as of Feb 02 2234  Caleen Essex Feb 02, 2018  2133 Pulse Rate(!): 114 [GS]    Clinical Course User Index [GS] Laurelyn Sickle      Final Clinical Impressions(s) / ED Diagnoses  Care turned over to A. Harris, [facility] @ 10:30 with labs pending and IV fluids in process. Patient appear stable for continued care by next provider.   ED Discharge Orders    None       Kerrie [place], [place] 02/02/18 2238    Samuel Jester, DO 02/04/18 1650

## 2018-02-03 MED ORDER — METOCLOPRAMIDE HCL 10 MG PO TABS: 10.0000 mg | ORAL_TABLET | Freq: Four times a day (QID) | ORAL | 0 refills | Status: DC | PRN

## 2018-02-03 NOTE — Discharge Instructions (Addendum)
Get help right away if: You have pain in your chest, neck, arm, or jaw. You feel extremely weak or you faint. You have persistent vomiting. You have vomit that is bright red or looks like black coffee grounds. You have bloody or black stools or stools that look like tar. You have a severe headache, a stiff neck, or both. You have severe pain, cramping, or bloating in your abdomen. You have difficulty breathing, or you are breathing very quickly. Your heart is beating very quickly. Your skin feels cold and clammy. You feel confused. You have signs of dehydration, such as: Dark urine, very little urine, or no urine. Cracked lips. Dry mouth. Sunken eyes. Sleepiness. Weakness. 

## 2018-02-12 ENCOUNTER — Other Ambulatory Visit: Payer: Self-pay | Admitting: Neurology

## 2018-02-17 IMAGING — CR DG CHEST 2V
2 series · 2 of 2 positions shown · non-contrast
Comparison: 02/23/2011 chest radiograph.

CLINICAL DATA: Chest pain.  Cough.  Tachycardia.

EXAM:
CHEST  2 VIEW

[chest pa]
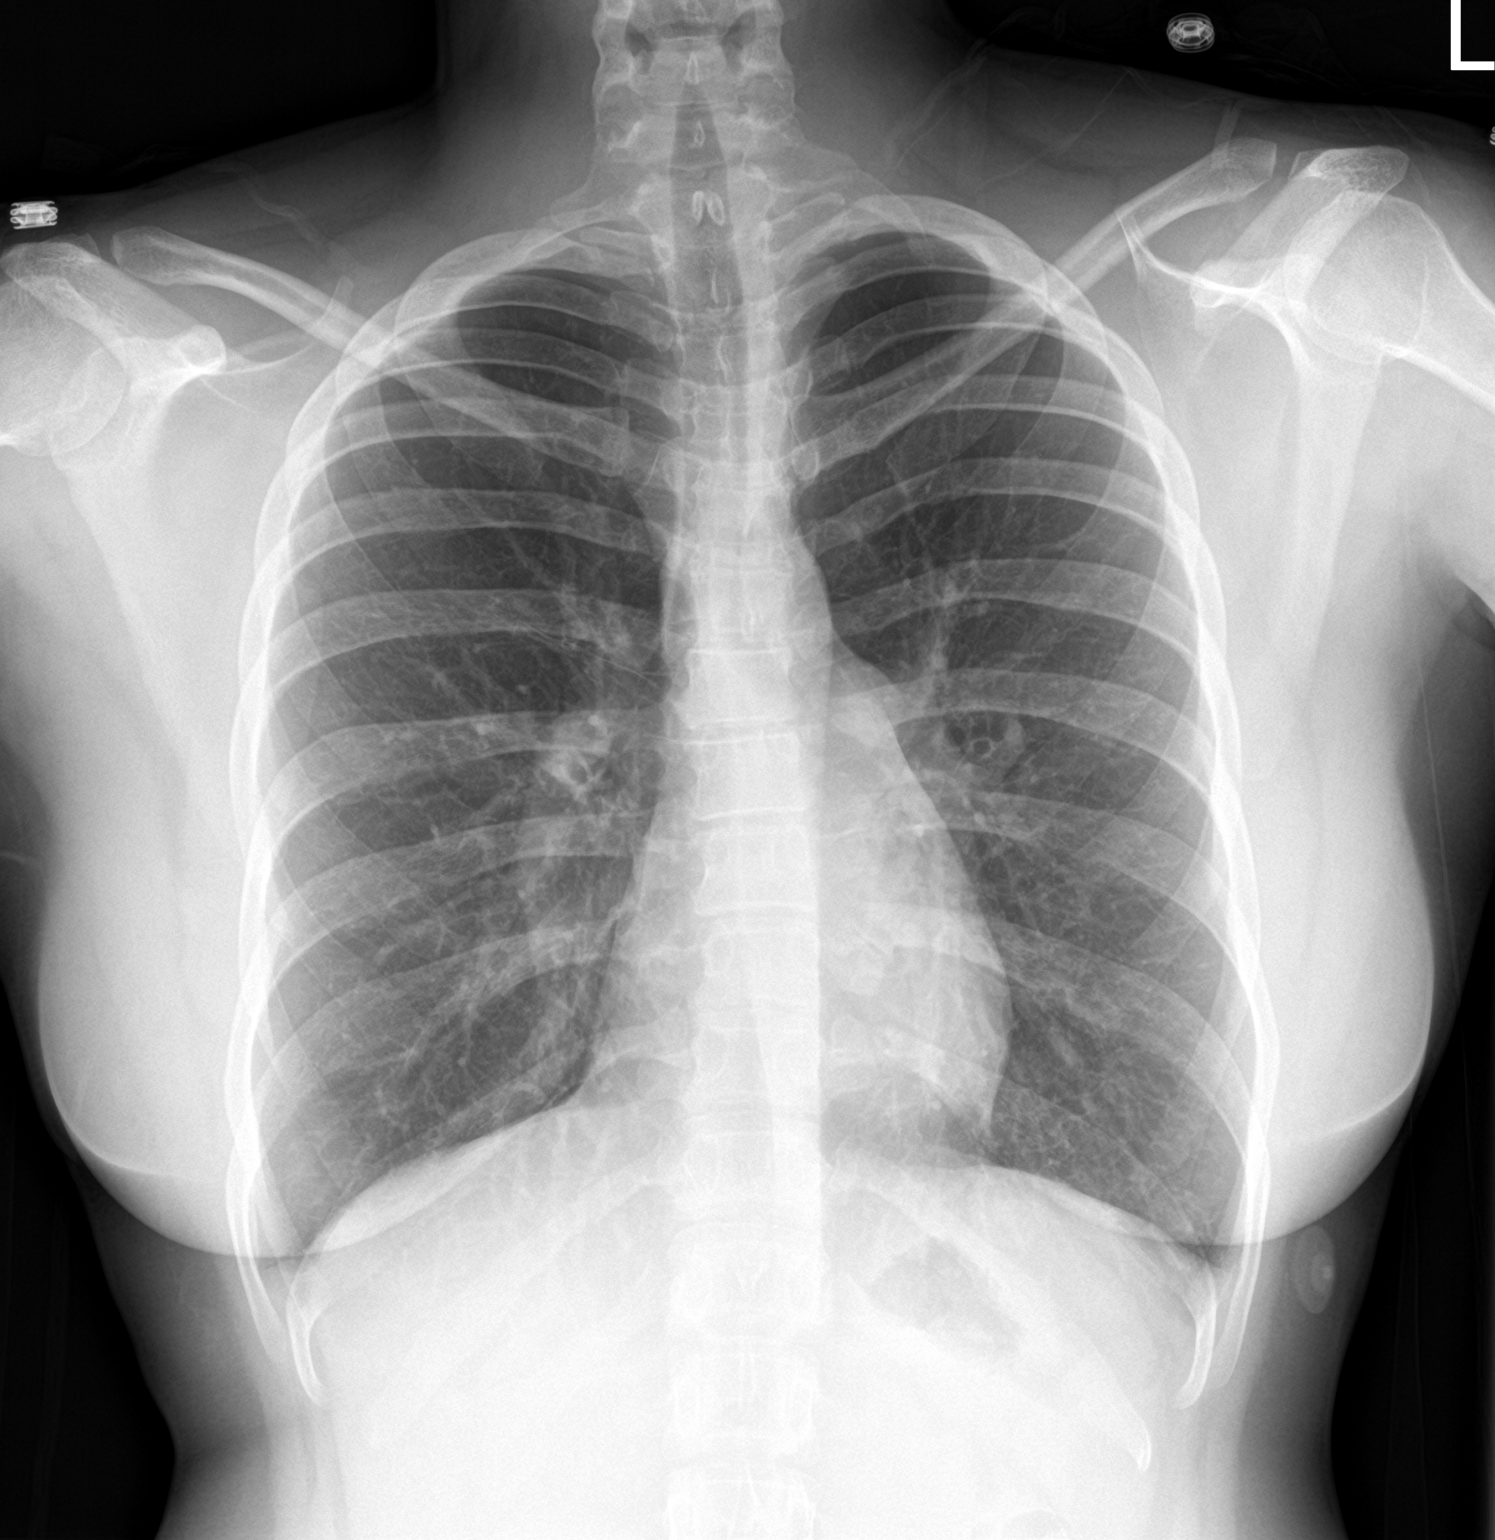

[chest lat]
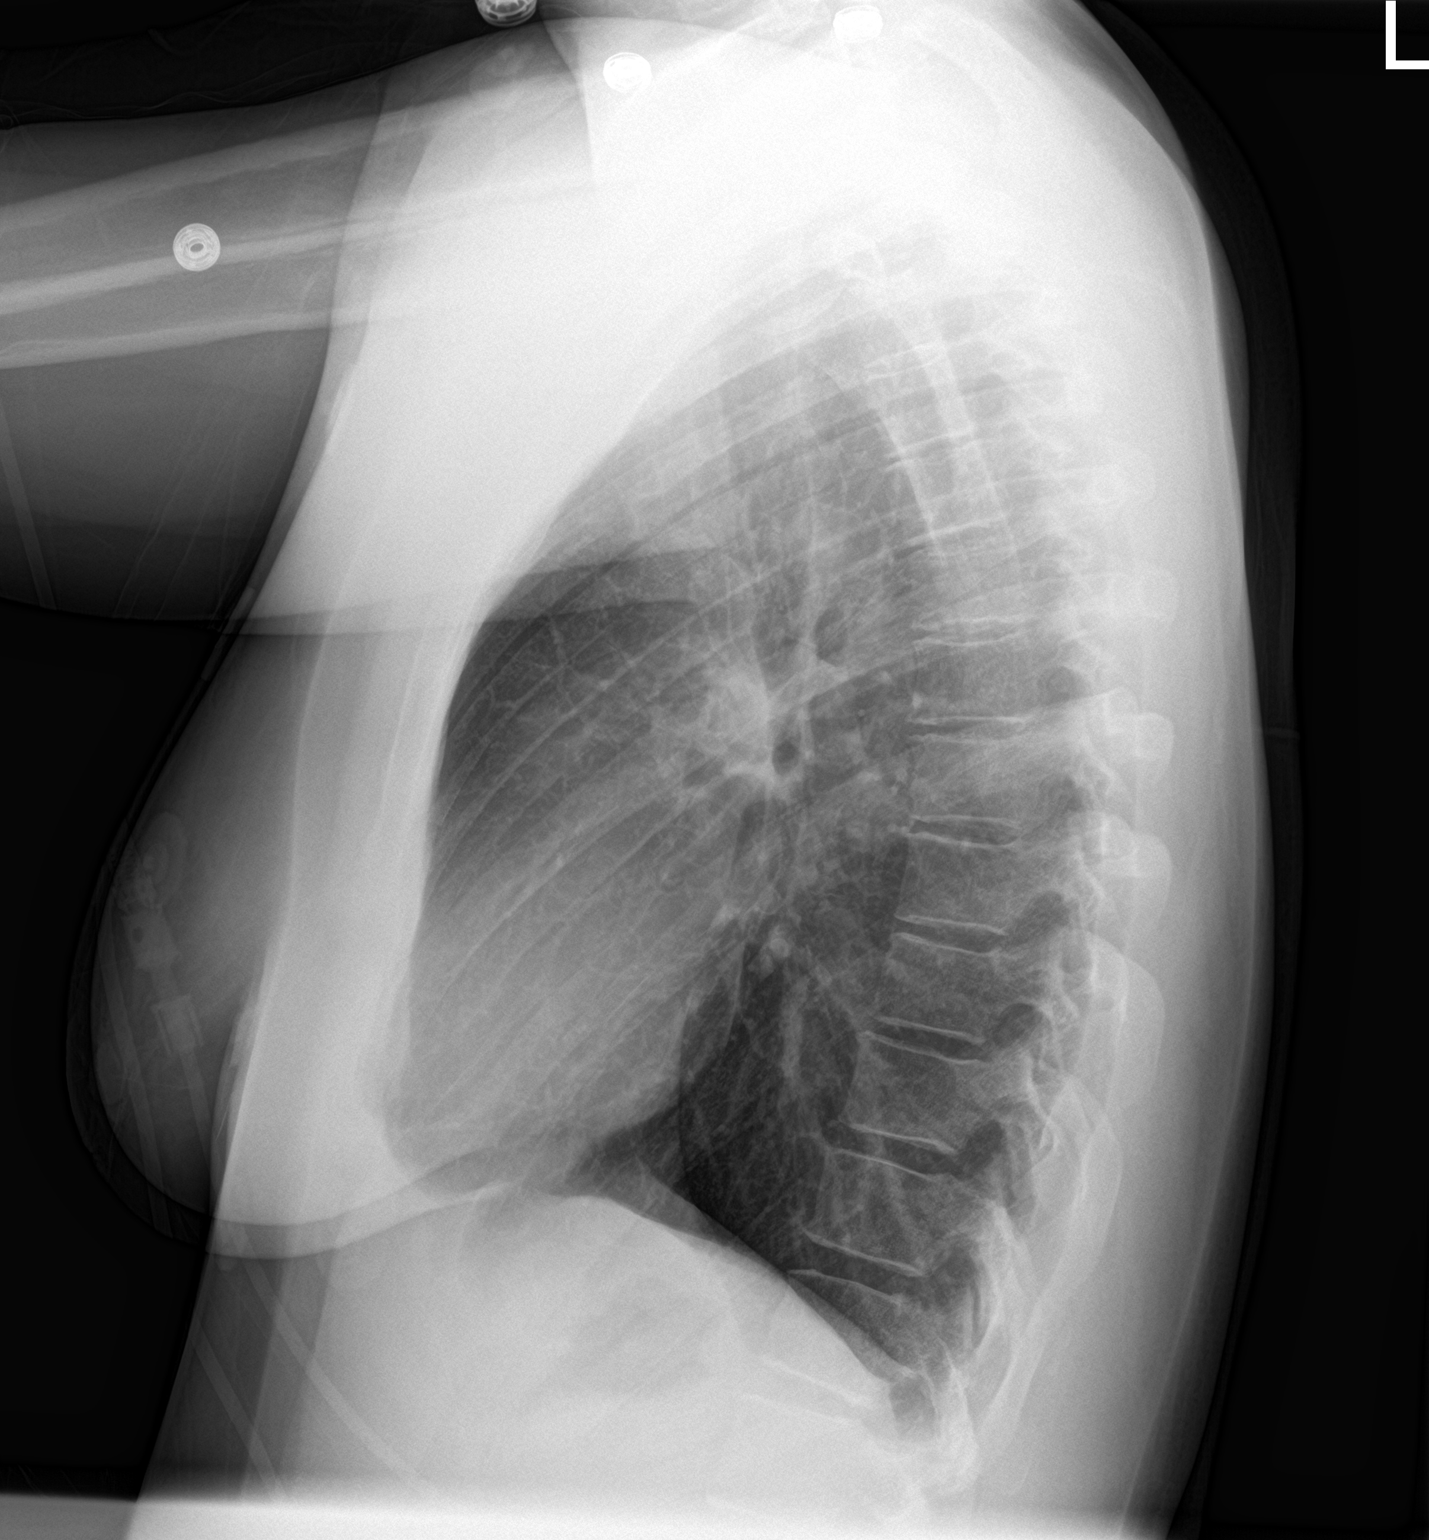

[2 of 2 positions shown; findings below may reference images not displayed]

FINDINGS: Stable cardiomediastinal silhouette with normal heart size. No
pneumothorax. No pleural effusion. Lungs appear clear, with no acute
consolidative airspace disease and no pulmonary edema.
IMPRESSION: No active cardiopulmonary disease.

## 2018-03-13 ENCOUNTER — Other Ambulatory Visit: Payer: Self-pay | Admitting: Neurology

## 2018-03-26 ENCOUNTER — Other Ambulatory Visit: Payer: Self-pay | Admitting: Neurology

## 2018-04-29 ENCOUNTER — Other Ambulatory Visit: Payer: Self-pay | Admitting: Neurology

## 2018-06-01 ENCOUNTER — Other Ambulatory Visit: Payer: Self-pay | Admitting: Neurology

## 2018-06-03 ENCOUNTER — Encounter (HOSPITAL_COMMUNITY): Payer: Self-pay | Admitting: Emergency Medicine

## 2018-06-03 ENCOUNTER — Other Ambulatory Visit: Payer: Self-pay

## 2018-06-03 ENCOUNTER — Ambulatory Visit (HOSPITAL_COMMUNITY)
Admission: EM | Admit: 2018-06-03 | Discharge: 2018-06-03 | Disposition: A | Discharge status: Home / Self Care | Payer: Medicare Other | Attending: Family Medicine | Admitting: Family Medicine

## 2018-06-03 DIAGNOSIS — L0390 Cellulitis, unspecified: Secondary | ICD-10-CM

## 2018-06-03 MED ORDER — DOXYCYCLINE HYCLATE 100 MG PO TABS: 100.0000 mg | ORAL_TABLET | Freq: Two times a day (BID) | ORAL | 0 refills | Status: DC

## 2018-06-03 NOTE — ED Triage Notes (Signed)
No dysuria. 

## 2018-06-03 NOTE — ED Triage Notes (Signed)
PT reports an abscess on buttocks and vaginal itching for 1 week.

## 2018-06-03 NOTE — ED Provider Notes (Addendum)
[facility]    CSN: [id number] Arrival date & time: 06/03/18  1119     History   Chief Complaint Chief Complaint  Patient presents with  . Abscess  . Vaginal Itching    HPI [name] [name] is a 33 y.o. female.   Established [facility] patient complaining of vaginitis and abscess.  She has several weeks of pustular outbreaks in distribution of underwear that fail to clear after washing linen.  She worries about bed bugs.     Past Medical History:  Diagnosis Date  . Depression    post partum  . 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 History of chlamydia   . Migraine     Patient Active Problem List   Diagnosis Date Noted  . Chronic migraine 01/26/2017  . Paresthesia 01/26/2017  . Active labor 11/04/2013  . Normal delivery at term 11/04/2013  . Normal delivery 06/04/2011    Past Surgical History:  Procedure Laterality Date  . APPENDECTOMY    . EYE SURGERY    . TYMPANOSTOMY TUBE PLACEMENT      OB History    Gravida  4   Para  4   Term  4   Preterm  0   AB  0   Living  4     SAB  0   TAB  0   Ectopic  0   Multiple  0   Live Births  4            Home Medications    Prior to Admission medications   Medication Sig Start Date End Date Taking? Authorizing Provider  etonogestrel (NEXPLANON) 68 MG IMPL implant 1 each by Subdermal route once.    Yes [provider]  rizatriptan (MAXALT-MLT) 10 MG disintegrating tablet TAKE 1 TABLET BY MOUTH AS NEEDED FOR MIGRAINE. MAY REPEAT ONCE IN 2 HOURS IF NEEDED. 03/26/18  Yes Levert Feinstein, MD  doxycycline (VIBRA-TABS) 100 MG tablet Take 1 tablet (100 mg total) by mouth 2 (two) times daily. 06/03/18   Elvina Sidle, MD  fluconazole (DIFLUCAN) 150 MG tablet Take 1 tablet (150 mg total) by mouth daily. 12/14/17   Dahlia Byes A, NP  metoCLOPramide (REGLAN) 10 MG tablet Take 1 tablet (10 mg total) by mouth every 6 (six) hours as needed for nausea (nausea/headache).  02/03/18   Arthor Captain, PA-C  nortriptyline (PAMELOR) 10 MG capsule TAKE 2 CAPSULES (20 MG TOTAL) BY MOUTH AT BEDTIME. 04/30/18   Levert Feinstein, MD  permethrin Verner [place]) 5 % cream Apply to affected area once 10/06/17   Marshell Levan, MD    Family History Family History  Problem Relation Age of Onset  . Alcohol abuse Mother   . Seizures Father   . Alcohol abuse Father     Social History Social History   Tobacco Use  . Smoking status: Former Smoker    Types: Cigarettes  . Smokeless tobacco: Never Used  Substance Use Topics  . Alcohol use: Yes    Comment: Rare use  . Drug use: No     Allergies   Ibuprofen and Septra [sulfamethoxazole-trimethoprim]   Review of Systems Review of Systems  Constitutional: Negative for fever.  Gastrointestinal: Positive for abdominal pain. Negative for constipation, diarrhea, nausea and vomiting.  All other systems reviewed and are negative.    Physical Exam Triage Vital Signs ED Triage Vitals [06/03/18 1137]  Enc Vitals Group     BP 96/76  Pulse Rate 62     Resp 16     Temp 98.7 F (37.1 C)     Temp Source Oral     SpO2 98 %     Weight      Height      Head Circumference      Peak Flow      Pain Score 8     Pain Loc      Pain Edu?      Excl. in [place]?    No data found.  Updated Vital Signs BP 96/76   Pulse 62   Temp 98.7 F (37.1 C) (Oral)   Resp 16   LMP 05/27/2018   SpO2 98%    Physical Exam Vitals signs and nursing note reviewed.  Constitutional:      Appearance: Normal appearance.  HENT:     Nose: Nose normal.     Mouth/Throat:     Mouth: Mucous membranes are dry.  Eyes:     Conjunctiva/sclera: Conjunctivae normal.  Neck:     Musculoskeletal: Normal range of motion and neck supple.  Cardiovascular:     Pulses: Normal pulses.  Pulmonary:     Effort: Pulmonary effort is normal.     Breath sounds: Normal breath sounds.  Abdominal:     Palpations: Abdomen is soft.     Tenderness: There is no abdominal  tenderness.     Comments: Hyperactive BS  Musculoskeletal: Normal range of motion.  Skin:    General: Skin is warm.     Comments: Multiple scaly 4 mm areas on labia and gluteal areas with central induration.  Neurological:     General: No focal deficit present.     Mental Status: She is alert.  Psychiatric:        Mood and Affect: Mood normal.        Behavior: Behavior normal.      UC Treatments / Results  Labs (all labs ordered are listed, but only abnormal results are displayed) Labs Reviewed - No data to display  EKG None  Radiology No results found.  Procedures Procedures (including critical care time)  Medications Ordered in UC Medications - No data to display  Initial Impression / Assessment and Plan / UC Course  I have reviewed the triage vital signs and the nursing notes.  Pertinent labs & imaging results that were available during my care of the patient were reviewed by me and considered in my medical decision making (see chart for details).    Final Clinical Impressions(s) / UC Diagnoses   Final diagnoses:  Cellulitis, unspecified cellulitis site     Discharge Instructions     Gently wash with soap and water twice a day  Wash the linens tonight    ED Prescriptions    Medication Sig Dispense Auth. Provider   doxycycline (VIBRA-TABS) 100 MG tablet Take 1 tablet (100 mg total) by mouth 2 (two) times daily. 20 tablet Elvina SidleLauenstein, Iman Orourke, MD     Controlled Substance Prescriptions [place] Controlled Substance Registry consulted? Not Applicable   Elvina SidleLauenstein, Demitrius Crass, MD 06/03/18 [id number]    Elvina SidleLauenstein, Sophie Quiles, MD 06/03/18 [phone]

## 2018-06-03 NOTE — Discharge Instructions (Addendum)
Gently wash with soap and water twice a day  Wash the linens tonight

## 2018-07-03 ENCOUNTER — Ambulatory Visit (HOSPITAL_COMMUNITY)
Admission: EM | Admit: 2018-07-03 | Discharge: 2018-07-03 | Disposition: A | Discharge status: Home / Self Care | Payer: Medicare Other | Attending: Family Medicine | Admitting: Family Medicine

## 2018-07-03 ENCOUNTER — Encounter (HOSPITAL_COMMUNITY): Payer: Self-pay | Admitting: Emergency Medicine

## 2018-07-03 ENCOUNTER — Other Ambulatory Visit: Payer: Self-pay

## 2018-07-03 DIAGNOSIS — J029 Acute pharyngitis, unspecified: Secondary | ICD-10-CM

## 2018-07-03 LAB — POCT RAPID STREP A: Streptococcus, Group A Screen (Direct): NEGATIVE

## 2018-07-03 MED ORDER — FLUTICASONE PROPIONATE 50 MCG/ACT NA SUSP: 1.0000 | Freq: Every day | NASAL | 2 refills | Status: AC

## 2018-07-03 MED ORDER — CETIRIZINE HCL 10 MG PO TABS: 10.0000 mg | ORAL_TABLET | Freq: Every day | ORAL | 0 refills | Status: DC

## 2018-07-03 MED ORDER — LIDOCAINE VISCOUS HCL 2 % MT SOLN: 15.0000 mL | OROMUCOSAL | 0 refills | Status: DC | PRN

## 2018-07-03 NOTE — ED Provider Notes (Signed)
[facility]    CSN: [id number] Arrival date & time: 07/03/18  1034     History   Chief Complaint Chief Complaint  Patient presents with  . Sore Throat    HPI [name] [name] is a 33 y.o. female.   Patient is a 33 year old female that presents with sore throat and ear discomfort. This has been presents x 3 days. Worsened last night after cutting the grass yesterday.  Mild cough and nasal congestion.  No associated fevers, chills or body aches.  She has done warm salt water gargles and taking TheraFlu without any relief.  Denies any recent sick contacts or recent trauma.  ROS per HPI      Past Medical History:  Diagnosis Date  . Depression    post partum  . 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 History of chlamydia   . Migraine     Patient Active Problem List   Diagnosis Date Noted  . Chronic migraine 01/26/2017  . Paresthesia 01/26/2017  . Active labor 11/04/2013  . Normal delivery at term 11/04/2013  . Normal delivery 06/04/2011    Past Surgical History:  Procedure Laterality Date  . APPENDECTOMY    . EYE SURGERY    . TYMPANOSTOMY TUBE PLACEMENT      OB History    Gravida  4   Para  4   Term  4   Preterm  0   AB  0   Living  4     SAB  0   TAB  0   Ectopic  0   Multiple  0   Live Births  4            Home Medications    Prior to Admission medications   Medication Sig Start Date End Date Taking? Authorizing Provider  cetirizine (ZYRTEC) 10 MG tablet Take 1 tablet (10 mg total) by mouth daily. 07/03/18   Dahlia ByesBast, Daiya Tamer A, NP  doxycycline (VIBRA-TABS) 100 MG tablet Take 1 tablet (100 mg total) by mouth 2 (two) times daily. Patient not taking: Reported on 07/03/2018 06/03/18   Elvina SidleLauenstein, Kurt, MD  etonogestrel (NEXPLANON) 68 MG IMPL implant 1 each by Subdermal route once.     [provider]  fluconazole (DIFLUCAN) 150 MG tablet Take 1 tablet (150 mg total) by mouth daily. Patient not  taking: Reported on 07/03/2018 12/14/17   Dahlia ByesBast, Giuliana Handyside A, NP  fluticasone (FLONASE) 50 MCG/ACT nasal spray Place 1 spray into both nostrils daily. 07/03/18   Valentine Barney, Gloris Manchesterraci A, NP  lidocaine (XYLOCAINE) 2 % solution Use as directed 15 mLs in the mouth or throat as needed for mouth pain. 07/03/18   Dahlia ByesBast, Mitesh Rosendahl A, NP  metoCLOPramide (REGLAN) 10 MG tablet Take 1 tablet (10 mg total) by mouth every 6 (six) hours as needed for nausea (nausea/headache). 02/03/18   Arthor CaptainHarris, Abigail, PA-C  nortriptyline (PAMELOR) 10 MG capsule TAKE 2 CAPSULES (20 MG TOTAL) BY MOUTH AT BEDTIME. 04/30/18   Levert FeinsteinYan, Yijun, MD  permethrin (ELIMITE) 5 % cream Apply to affected area once Patient not taking: Reported on 07/03/2018 10/06/17   Marshell LevanMonroe, Anna G, MD  rizatriptan (MAXALT-MLT) 10 MG disintegrating tablet TAKE 1 TABLET BY MOUTH AS NEEDED FOR MIGRAINE. MAY REPEAT ONCE IN 2 HOURS IF NEEDED. 03/26/18   Levert FeinsteinYan, Yijun, MD    Family History Family History  Problem Relation Age of Onset  . Alcohol abuse Mother   . Seizures Father   .  Alcohol abuse Father     Social History Social History   Tobacco Use  . Smoking status: Former Smoker    Types: Cigarettes  . Smokeless tobacco: Never Used  Substance Use Topics  . Alcohol use: Yes    Comment: Rare use  . Drug use: No     Allergies   Ibuprofen and Septra [sulfamethoxazole-trimethoprim]   Review of Systems Review of Systems   Physical Exam Triage Vital Signs ED Triage Vitals  Enc Vitals Group     BP 07/03/18 1050 110/73     Pulse Rate 07/03/18 1050 80     Resp 07/03/18 1050 18     Temp 07/03/18 1050 98.8 F (37.1 C)     Temp Source 07/03/18 1050 Oral     SpO2 07/03/18 1050 95 %     Weight --      Height --      Head Circumference --      Peak Flow --      Pain Score 07/03/18 1051 5     Pain Loc --      Pain Edu? --      Excl. in [place]? --    No data found.  Updated Vital Signs BP 110/73 (BP Location: Right Arm)   Pulse 80   Temp 98.8 F (37.1 C) (Oral)   Resp 18    SpO2 95%   Visual Acuity Right Eye Distance:   Left Eye Distance:   Bilateral Distance:    Right Eye Near:   Left Eye Near:    Bilateral Near:     Physical Exam Vitals signs and nursing note reviewed.  Constitutional:      General: She is not in acute distress.    Appearance: She is well-developed. She is not ill-appearing, toxic-appearing or diaphoretic.  HENT:     Head: Normocephalic and atraumatic.     Right Ear: Tympanic membrane normal.     Left Ear: Tympanic membrane normal.     Nose: Congestion present.     Mouth/Throat:     Pharynx: Uvula midline. Posterior oropharyngeal erythema present.     Tonsils: 1+ on the right. 1+ on the left.  Eyes:     Conjunctiva/sclera: Conjunctivae normal.  Neck:     Musculoskeletal: Normal range of motion.  Cardiovascular:     Rate and Rhythm: Normal rate and regular rhythm.  Pulmonary:     Effort: Pulmonary effort is normal.     Breath sounds: Normal breath sounds.  Lymphadenopathy:     Cervical: No cervical adenopathy.  Skin:    General: Skin is warm and dry.  Neurological:     Mental Status: She is alert.  Psychiatric:        Mood and Affect: Mood normal.      UC Treatments / Results  Labs (all labs ordered are listed, but only abnormal results are displayed) Labs Reviewed  POCT RAPID STREP A    EKG None  Radiology No results found.  Procedures Procedures (including critical care time)  Medications Ordered in UC Medications - No data to display  Initial Impression / Assessment and Plan / UC Course  I have reviewed the triage vital signs and the nursing notes.  Pertinent labs & imaging results that were available during my care of the patient were reviewed by me and considered in my medical decision making (see chart for details).     Symptoms consistent with allergies We will check rapid strep to ensure there  is no strep infection based on severe sore throat Most likely will treat with Flonase and Zyrtec  for allergy symptoms  Rapid strep test negative we will go with previous plan  Final Clinical Impressions(s) / UC Diagnoses   Final diagnoses:  Sore throat     Discharge Instructions     Your rapid strep test was negative Most likely your symptoms are coming from allergies. Flonase and Zyrtec for symptoms For the severe sore throat I will send in some lidocaine to gargle and spit Follow up as needed for continued or worsening symptoms     ED Prescriptions    Medication Sig Dispense Auth. Provider   fluticasone (FLONASE) 50 MCG/ACT nasal spray Place 1 spray into both nostrils daily. 16 g Demani Weyrauch A, NP   cetirizine (ZYRTEC) 10 MG tablet Take 1 tablet (10 mg total) by mouth daily. 30 tablet Elvi Leventhal A, NP   lidocaine (XYLOCAINE) 2 % solution Use as directed 15 mLs in the mouth or throat as needed for mouth pain. 100 mL Dahlia ByesBast, Genisis Sonnier A, NP     Controlled Substance Prescriptions [place] Controlled Substance Registry consulted? Not Applicable   Janace ArisBast, Taylr Meuth A, NP 07/03/18 1157

## 2018-07-03 NOTE — Discharge Instructions (Signed)
Your rapid strep test was negative Most likely your symptoms are coming from allergies. Flonase and Zyrtec for symptoms For the severe sore throat I will send in some lidocaine to gargle and spit Follow up as needed for continued or worsening symptoms

## 2018-07-03 NOTE — ED Triage Notes (Signed)
Pt here for sore throat and some SOB at night

## 2018-07-24 ENCOUNTER — Other Ambulatory Visit: Payer: Self-pay | Admitting: Neurology

## 2018-07-31 ENCOUNTER — Other Ambulatory Visit: Payer: Self-pay | Admitting: Neurology

## 2018-10-19 ENCOUNTER — Other Ambulatory Visit: Payer: Self-pay

## 2018-10-19 ENCOUNTER — Encounter (HOSPITAL_COMMUNITY): Payer: Self-pay | Admitting: Emergency Medicine

## 2018-10-19 ENCOUNTER — Ambulatory Visit (HOSPITAL_COMMUNITY)
Admission: EM | Admit: 2018-10-19 | Discharge: 2018-10-19 | Disposition: A | Discharge status: Home / Self Care | Payer: Medicare Other | Attending: Emergency Medicine | Admitting: Emergency Medicine

## 2018-10-19 DIAGNOSIS — Z3202 Encounter for pregnancy test, result negative: Secondary | ICD-10-CM

## 2018-10-19 DIAGNOSIS — B9689 Other specified bacterial agents as the cause of diseases classified elsewhere: Secondary | ICD-10-CM | POA: Diagnosis not present

## 2018-10-19 DIAGNOSIS — L299 Pruritus, unspecified: Secondary | ICD-10-CM | POA: Insufficient documentation

## 2018-10-19 DIAGNOSIS — N760 Acute vaginitis: Secondary | ICD-10-CM | POA: Diagnosis not present

## 2018-10-19 LAB — POCT URINALYSIS DIP (DEVICE)
Bilirubin Urine: NEGATIVE
Glucose, UA: NEGATIVE mg/dL
Hgb urine dipstick: NEGATIVE
Ketones, ur: NEGATIVE mg/dL
Nitrite: NEGATIVE
Protein, ur: NEGATIVE mg/dL
Specific Gravity, Urine: 1.025 (ref 1.005–1.030)
Urobilinogen, UA: 0.2 mg/dL (ref 0.0–1.0)
pH: 7 (ref 5.0–8.0)

## 2018-10-19 LAB — POCT PREGNANCY, URINE: Preg Test, Ur: NEGATIVE

## 2018-10-19 MED ORDER — PERMETHRIN 5 % EX CREA: TOPICAL_CREAM | CUTANEOUS | 1 refills | Status: DC

## 2018-10-19 MED ORDER — METRONIDAZOLE 500 MG PO TABS: 500.0000 mg | ORAL_TABLET | Freq: Two times a day (BID) | ORAL | 0 refills | Status: AC

## 2018-10-19 MED ORDER — HYDROXYZINE HCL 25 MG PO TABS: 25.0000 mg | ORAL_TABLET | Freq: Four times a day (QID) | ORAL | 0 refills | Status: DC | PRN

## 2018-10-19 NOTE — ED Triage Notes (Signed)
PT reports dysuria and vaginal itching for 2 weeks.

## 2018-10-19 NOTE — Discharge Instructions (Signed)
Take the medication as written. Give [place] a working phone number so that we can contact you if needed. Refrain from sexual contact until you know your results and your partner(s) are treated if necessary.   The permethrin will treat scabies and also pubic lice.  Below is a list of primary care practices who are taking new patients for you to follow-up with.  [facility] at [facility] [address] [place], [place] [address] [phone]  [place], [place] [address] [phone]  [facility] [phone] [place] [address]  [facility]: [place]  [phone]  [place]: [place]   [phone]  [facility]: [address] [place]  [phone]  [place] primary [facility] : [address] [place] [phone]  [facility] Primary [facility]: [address] [place] [address] [phone]  [facility]: [place] [phone]  Dr. Blanchie Serve [employer] [place]  [phone]  Dr. Benito Mccreedy, [facility]. [place], [place] [address]  [phone]  Go to www.goodrx.com to look up your medications. This will give you a list of where you can find your prescriptions at the most affordable prices. Or ask the pharmacist what the cash price is, or if they have any other discount programs available to help make your medication more affordable. This can be less expensive than what you would pay with insurance.

## 2018-10-19 NOTE — ED Provider Notes (Signed)
HPI  SUBJECTIVE:  [name] [name] is a 33 y.o. female who presents with several issues.  First, she reports odorous vaginal discharge, genital itching, dysuria starting for 2 weeks.  She reports having painful genital blisters at one point, but these have resolved.  She denies urinary urgency, frequency, cloudy or odorous urine, hematuria.  She tried cranberry juice, pushing water without improvement in her symptoms.  No alleviating factors.  Symptoms are worse with urination.  She states symptoms started having intercourse with her boyfriend.  He is asymptomatic.  STDs are a concern today.  No recent antibiotic, abdominal, back, pelvic pain, no vomiting, fevers.  She has a past medical history of herpes, syphilis, BV, yeast.  No history of gonorrhea, chlamydia, HIV, trichomonas.  Also history of UTI.  No history of pyelonephritis, nephrolithiasis, diabetes.  Patient is concerned that she may have pubic lice.  Second, she reports "itching all over".  She states that "everyone in the house is itching" and brings her daughter in today for pruritic rash.  They have a current infestation of bedbugs.  Mother states she feels as if she is being bitten at night.  No blood on the back close in the morning.  She states that the rash is itchy all day.  No new lotions, soaps, detergents.  They have pets in the house, but they are not known to have fleas.  She tried Benadryl, hot baths, cocoa butter without improvement in her symptoms.  Symptoms worse with heat. PMD: none   Past Medical History:  Diagnosis Date  . Depression    post partum  . 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 History of chlamydia   . Migraine     Past Surgical History:  Procedure Laterality Date  . APPENDECTOMY    . EYE SURGERY    . TYMPANOSTOMY TUBE PLACEMENT      Family History  Problem Relation Age of Onset  . Alcohol abuse Mother   . Seizures Father   . Alcohol abuse Father     Social History    Tobacco Use  . Smoking status: Former Smoker    Types: Cigarettes  . Smokeless tobacco: Never Used  Substance Use Topics  . Alcohol use: Yes    Comment: Rare use  . Drug use: No    No current facility-administered medications for this encounter.   Current Outpatient Medications:  .  etonogestrel (NEXPLANON) 68 MG IMPL implant, 1 each by Subdermal route once. , Disp: , Rfl:  .  cetirizine (ZYRTEC) 10 MG tablet, Take 1 tablet (10 mg total) by mouth daily., Disp: 30 tablet, Rfl: 0 .  fluticasone (FLONASE) 50 MCG/ACT nasal spray, Place 1 spray into both nostrils daily., Disp: 16 g, Rfl: 2 .  hydrOXYzine (ATARAX/VISTARIL) 25 MG tablet, Take 1 tablet (25 mg total) by mouth every 6 (six) hours as needed., Disp: 20 tablet, Rfl: 0 .  metoCLOPramide (REGLAN) 10 MG tablet, Take 1 tablet (10 mg total) by mouth every 6 (six) hours as needed for nausea (nausea/headache)., Disp: 6 tablet, Rfl: 0 .  metroNIDAZOLE (FLAGYL) 500 MG tablet, Take 1 tablet (500 mg total) by mouth 2 (two) times daily for 7 days., Disp: 14 tablet, Rfl: 0 .  nortriptyline (PAMELOR) 10 MG capsule, Take 2 capsules (20 mg total) by mouth at bedtime. Please call [phone] to scheduled a follow up appt., Disp: 180 capsule, Rfl: 0 .  permethrin (ELIMITE) 5 % cream, Apply from chin down,  leave on for 8-14 hours, rinse. Repeat in 1 week, Disp: 60 g, Rfl: 1 .  rizatriptan (MAXALT-MLT) 10 MG disintegrating tablet, TAKE 1 TABLET BY MOUTH AS NEEDED FOR MIGRAINE. MAY REPEAT ONCE IN 2 HOURS IF NEEDED., Disp: 12 tablet, Rfl: 1  Facility-Administered Medications Ordered in Other Encounters:  .  rho(d) immune globulin (RHIG/Rhophylac) injection 300 mcg, 300 mcg, Intramuscular, Once, Kathreen Cosier, MD  Allergies  Allergen Reactions  . Ibuprofen Swelling    Eyes swelled   . Septra [Sulfamethoxazole-Trimethoprim] Hives    legs     ROS  As noted in HPI.   Physical Exam  BP 99/63   Pulse 78   Temp 98.3 F (36.8 C) (Oral)    Resp 16   SpO2 97%   Constitutional: Well developed, well nourished, no acute distress Eyes:  EOMI, conjunctiva normal bilaterally HENT: Normocephalic, atraumatic,mucus membranes moist Respiratory: Normal inspiratory effort Cardiovascular: Normal rate GI: nondistended.  Soft, nontender.  No suprapubic, flank tenderness. Back: No CVAT GU: No pubic lice noted.  No excoriations, rash.  No vulvar or labial rash.  Positive thin white odorous vaginal discharge.  os normal.  Mild uterine tenderness.  Uterus smooth.  No adnexal tenderness or masses.  No CMT. chaperone present during exam. skin: No rash, skin intact Musculoskeletal: no deformities Neurologic: Alert & oriented x 3, no focal neuro deficits Psychiatric: Speech and behavior appropriate   ED Course   Medications - No data to display  Orders Placed This Encounter  Procedures  . Urine culture    Standing Status:   Standing    Number of Occurrences:   1  . POCT urinalysis dip (device)    Standing Status:   Standing    Number of Occurrences:   1  . POC urine pregnancy    Standing Status:   Standing    Number of Occurrences:   1  . Pregnancy, urine POC    Standing Status:   Standing    Number of Occurrences:   1    Results for orders placed or performed during the hospital encounter of 10/19/18 (from the past 24 hour(s))  POCT urinalysis dip (device)     Status: Abnormal   Collection Time: 10/19/18  6:49 PM  Result Value Ref Range   Glucose, UA NEGATIVE NEGATIVE mg/dL   Bilirubin Urine NEGATIVE NEGATIVE   Ketones, ur NEGATIVE NEGATIVE mg/dL   Specific Gravity, Urine 1.025 1.005 - 1.030   Hgb urine dipstick NEGATIVE NEGATIVE   pH 7.0 5.0 - 8.0   Protein, ur NEGATIVE NEGATIVE mg/dL   Urobilinogen, UA 0.2 0.0 - 1.0 mg/dL   Nitrite NEGATIVE NEGATIVE   Leukocytes,Ua SMALL (A) NEGATIVE  Pregnancy, urine POC     Status: None   Collection Time: 10/19/18  7:09 PM  Result Value Ref Range   Preg Test, Ur NEGATIVE NEGATIVE    No results found.  ED Clinical Impression  1. BV (bacterial vaginosis)   2. Itching      ED Assessment/Plan  1.  Dysuria, vaginal discharge.  Suspect BV.  Home with Flagyl 500 mg p.o. twice daily for 1 week.  Advised patient to not drink while taking this.  Sending urine off for culture to confirm presence/absence of UTI prior to initiating additional antibiotic treatment.  She is also requesting treatment for pubic lice.  Home with permethrin.  Suspect that her daughter has scabies, so think that this is reasonable.  Will check HIV, RPR, gonorrhea, chlamydia, BV, yeast,  trichomonas.  Of note, patient has had syphilis in the past she states was treated.  No evidence of herpes today.  2.  Itching.  There is no rash anywhere, however could be scabies, permethrin will help with this.  Will send home with Claritin or Zyrtec, and if that does not work, Atarax.  Follow-up with PMD of choice as needed.  Will provide primary care list.   Meds ordered this encounter  Medications  . permethrin (ELIMITE) 5 % cream    Sig: Apply from chin down, leave on for 8-14 hours, rinse. Repeat in 1 week    Dispense:  60 g    Refill:  1  . hydrOXYzine (ATARAX/VISTARIL) 25 MG tablet    Sig: Take 1 tablet (25 mg total) by mouth every 6 (six) hours as needed.    Dispense:  20 tablet    Refill:  0  . metroNIDAZOLE (FLAGYL) 500 MG tablet    Sig: Take 1 tablet (500 mg total) by mouth 2 (two) times daily for 7 days.    Dispense:  14 tablet    Refill:  0    *This clinic note was created using [employer]. Therefore, there may be occasional mistakes despite careful proofreading.   ?   Melynda Ripple, MD 10/20/18 [phone]

## 2018-10-20 LAB — URINE CULTURE: Culture: >=100000 — AB

## 2018-10-20 LAB — RPR: RPR Ser Ql: NONREACTIVE

## 2018-10-20 LAB — HIV ANTIBODY (ROUTINE TESTING W REFLEX): HIV Screen 4th Generation wRfx: NONREACTIVE

## 2018-10-22 ENCOUNTER — Telehealth (HOSPITAL_COMMUNITY): Payer: Self-pay | Admitting: Emergency Medicine

## 2018-10-22 LAB — CERVICOVAGINAL ANCILLARY ONLY
Bacterial Vaginitis (gardnerella): POSITIVE — AB
Candida Glabrata: NEGATIVE
Candida Vaginitis: NEGATIVE
Molecular Disclaimer: NEGATIVE
Molecular Disclaimer: NEGATIVE
Molecular Disclaimer: NEGATIVE
Molecular Disclaimer: NORMAL
Trichomonas: POSITIVE — AB

## 2018-10-22 NOTE — Telephone Encounter (Signed)
Bacterial Vaginosis test is positive.  Prescription for metronidazole was given at the urgent care visit. Pt contacted regarding results. Answered all questions. Verbalized understanding.  Trichomonas is positive. Rx metronidazole was given at the urgent care visit. Pt needs education to please refrain from sexual intercourse for 7 days to give the medicine time to work. Sexual partners need to be notified and tested/treated. Condoms may reduce risk of reinfection. Recheck for further evaluation if symptoms are not improving.   Pending G/C  Patient contacted and made aware of    results, all questions answered

## 2018-10-23 LAB — CERVICOVAGINAL ANCILLARY ONLY
Chlamydia: NEGATIVE
Neisseria Gonorrhea: NEGATIVE

## 2018-10-27 ENCOUNTER — Other Ambulatory Visit: Payer: Self-pay

## 2018-10-27 ENCOUNTER — Encounter (HOSPITAL_COMMUNITY): Payer: Self-pay

## 2018-10-27 ENCOUNTER — Ambulatory Visit (HOSPITAL_COMMUNITY)
Admission: EM | Admit: 2018-10-27 | Discharge: 2018-10-27 | Disposition: A | Discharge status: Home / Self Care | Payer: Medicare Other | Attending: Family Medicine | Admitting: Family Medicine

## 2018-10-27 DIAGNOSIS — S29012A Strain of muscle and tendon of back wall of thorax, initial encounter: Secondary | ICD-10-CM

## 2018-10-27 DIAGNOSIS — S46812A Strain of other muscles, fascia and tendons at shoulder and upper arm level, left arm, initial encounter: Secondary | ICD-10-CM

## 2018-10-27 MED ORDER — CYCLOBENZAPRINE HCL 10 MG PO TABS: 5.0000 mg | ORAL_TABLET | Freq: Three times a day (TID) | ORAL | 0 refills | Status: DC | PRN

## 2018-10-27 MED ORDER — MELOXICAM 15 MG PO TABS: 15.0000 mg | ORAL_TABLET | Freq: Every day | ORAL | 0 refills | Status: DC

## 2018-10-27 MED ORDER — TRAMADOL HCL 50 MG PO TABS: 50.0000 mg | ORAL_TABLET | Freq: Two times a day (BID) | ORAL | 0 refills | Status: DC | PRN

## 2018-10-27 NOTE — ED Triage Notes (Signed)
Pt states she has pain in her left side. Pt states it started yesterday.

## 2018-10-27 NOTE — Discharge Instructions (Addendum)
Heat (pad or rice pillow in microwave) over affected area, 10-15 minutes twice daily.   Ice/cold pack over area for 10-15 min twice daily.  OK to take Tylenol 1000 mg (2 extra strength tabs) or 975 mg (3 regular strength tabs) every 6 hours as needed.  Take Flexeril (cyclobenzaprine) 1-2 hours before planned bedtime. If it makes you drowsy, do not take during the day. You can try half a tab the following night.  Do not drink alcohol, do any illicit/street drugs, drive or do anything that requires alertness while on the tramadol.   Trapezius stretches/exercises Do exercises exactly as told by your health care provider and adjust them as directed. It is normal to feel mild stretching, pulling, tightness, or discomfort as you do these exercises, but you should stop right away if you feel sudden pain or your pain gets worse.   Stretching and range of motion exercises These exercises warm up your muscles and joints and improve the movement and flexibility of your shoulder. These exercises can also help to relieve pain, numbness, and tingling. If you are unable to do any of the following for any reason, do not further attempt to do it.   Exercise A: Flexion, standing     Stand and hold a broomstick, a cane, or a similar object. Place your hands a little more than shoulder-width apart on the object. Your left / right hand should be palm-up, and your other hand should be palm-down. Push the stick to raise your left / right arm out to your side and then over your head. Use your other hand to help move the stick. Stop when you feel a stretch in your shoulder, or when you reach the angle that is recommended by your health care provider. Avoid shrugging your shoulder while you raise your arm. Keep your shoulder blade tucked down toward your spine. Hold for 30 seconds. Slowly return to the starting position. Repeat 2 times. Complete this exercise 3 times per week.  Exercise B: Abduction, supine       Lie on your back and hold a broomstick, a cane, or a similar object. Place your hands a little more than shoulder-width apart on the object. Your left / right hand should be palm-up, and your other hand should be palm-down. Push the stick to raise your left / right arm out to your side and then over your head. Use your other hand to help move the stick. Stop when you feel a stretch in your shoulder, or when you reach the angle that is recommended by your health care provider. Avoid shrugging your shoulder while you raise your arm. Keep your shoulder blade tucked down toward your spine. Hold for 30 seconds. Slowly return to the starting position. Repeat 2 times. Complete this exercise 3 times per week.  Exercise C: Flexion, active-assisted     Lie on your back. You may bend your knees for comfort. Hold a broomstick, a cane, or a similar object. Place your hands about shoulder-width apart on the object. Your palms should face toward your feet. Raise the stick and move your arms over your head and behind your head, toward the floor. Use your healthy arm to help your left / right arm move farther. Stop when you feel a gentle stretch in your shoulder, or when you reach the angle where your health care provider tells you to stop. Hold for 30 seconds. Slowly return to the starting position. Repeat 2 times. Complete this exercise 3 times  per week.  Exercise D: External rotation and abduction     Stand in a door frame with one of your feet slightly in front of the other. This is called a staggered stance. Choose one of the following positions as told by your health care provider: Place your hands and forearms on the door frame above your head. Place your hands and forearms on the door frame at the height of your head. Place your hands on the door frame at the height of your elbows. Slowly move your weight onto your front foot until you feel a stretch across your chest and in the front of your  shoulders. Keep your head and chest upright and keep your abdominal muscles tight. Hold for 30 seconds. To release the stretch, shift your weight to your back foot. Repeat 2 times. Complete this stretch 3 times per week.  Strengthening exercises These exercises build strength and endurance in your shoulder. Endurance is the ability to use your muscles for a long time, even after your muscles get tired. Exercise E: Scapular depression and adduction  Sit on a stable chair. Support your arms in front of you with pillows, armrests, or a tabletop. Keep your elbows in line with the sides of your body. Gently move your shoulder blades down toward your middle back. Relax the muscles on the tops of your shoulders and in the back of your neck. Hold for 3 seconds. Slowly release the tension and relax your muscles completely before doing this exercise again. Repeat for a total of 10 repetitions. After you have practiced this exercise, try doing the exercise without the arm support. Then, try the exercise while standing instead of sitting. Repeat 2 times. Complete this exercise 3 times per week.  Exercise F: Shoulder abduction, isometric     Stand or sit about 4-6 inches (10-15 cm) from a wall with your left / right side facing the wall. Bend your left / right elbow and gently press your elbow against the wall. Increase the pressure slowly until you are pressing as hard as you can without shrugging your shoulder. Hold for 3 seconds. Slowly release the tension and relax your muscles completely. Repeat for a total of 10 repetitions. Repeat 2 times. Complete this exercise 3 times per week.  Exercise G: Shoulder flexion, isometric     Stand or sit about 4-6 inches (10-15 cm) away from a wall with your left / right side facing the wall. Keep your left / right elbow straight and gently press the top of your fist against the wall. Increase the pressure slowly until you are pressing as hard as you can  without shrugging your shoulder. Hold for 10-15 seconds. Slowly release the tension and relax your muscles completely. Repeat for a total of 10 repetitions. Repeat 2 times. Complete this exercise 3 times per week.  Exercise H: Internal rotation     Sit in a stable chair without armrests, or stand. Secure an exercise band at your left / right side, at elbow height. Place a soft object, such as a folded towel or a small pillow, under your left / right upper arm so your elbow is a few inches (about 8 cm) away from your side. Hold the end of the exercise band so the band stretches. Keeping your elbow pressed against the soft object under your arm, move your forearm across your body toward your abdomen. Keep your body steady so the movement is only coming from your shoulder. Hold for 3 seconds.  Slowly return to the starting position. Repeat for a total of 10 repetitions. Repeat 2 times. Complete this exercise 3 times per week.  Exercise I: External rotation     Sit in a stable chair without armrests, or stand. Secure an exercise band at your left / right side, at elbow height. Place a soft object, such as a folded towel or a small pillow, under your left / right upper arm so your elbow is a few inches (about 8 cm) away from your side. Hold the end of the exercise band so the band stretches. Keeping your elbow pressed against the soft object under your arm, move your forearm out, away from your abdomen. Keep your body steady so the movement is only coming from your shoulder. Hold for 3 seconds. Slowly return to the starting position. Repeat for a total of 10 repetitions. Repeat 2 times. Complete this exercise 3 times per week. Exercise J: Shoulder extension  Sit in a stable chair without armrests, or stand. Secure an exercise band to a stable object in front of you so the band is at shoulder height. Hold one end of the exercise band in each hand. Your palms should face each other. Straighten  your elbows and lift your hands up to shoulder height. Step back, away from the secured end of the exercise band, until the band stretches. Squeeze your shoulder blades together and pull your hands down to the sides of your thighs. Stop when your hands are straight down by your sides. Do not let your hands go behind your body. Hold for 3 seconds. Slowly return to the starting position. Repeat for a total of 10 repetitions. Repeat 2 times. Complete this exercise 3 times per week.  Exercise K: Shoulder extension, prone     Lie on your abdomen on a firm surface so your left / right arm hangs over the edge. Hold a 5 lb weight in your hand so your palm faces in toward your body. Your arm should be straight. Squeeze your shoulder blade down toward the middle of your back. Slowly raise your arm behind you, up to the height of the surface that you are lying on. Keep your arm straight. Hold for 3 seconds. Slowly return to the starting position and relax your muscles. Repeat for a total of 10 repetitions. Repeat 2 times. Complete this exercise 3 times per week.   Exercise L: Horizontal abduction, prone  Lie on your abdomen on a firm surface so your left / right arm hangs over the edge. Hold a 5 lb weight in your hand so your palm faces toward your feet. Your arm should be straight. Squeeze your shoulder blade down toward the middle of your back. Bend your elbow so your hand moves up, until your elbow is bent to an "L" shape (90 degrees). With your elbow bent, slowly move your forearm forward and up. Raise your hand up to the height of the surface that you are lying on. Your upper arm should not move, and your elbow should stay bent. At the top of the movement, your palm should face the floor. Hold for 3 seconds. Slowly return to the starting position and relax your muscles. Repeat for a total of 10 repetitions. Repeat 2 times. Complete this exercise 3 times per week.  Exercise M: Horizontal  abduction, standing  Sit on a stable chair, or stand. Secure an exercise band to a stable object in front of you so the band is at shoulder height.  Hold one end of the exercise band in each hand. Straighten your elbows and lift your hands straight in front of you, up to shoulder height. Your palms should face down, toward the floor. Step back, away from the secured end of the exercise band, until the band stretches. Move your arms out to your sides, and keep your arms straight. Hold for 3 seconds. Slowly return to the starting position. Repeat for a total of 10 repetitions. Repeat 2 times. Complete this exercise 3 times per week.  Exercise N: Scapular retraction and elevation  Sit on a stable chair, or stand. Secure an exercise band to a stable object in front of you so the band is at shoulder height. Hold one end of the exercise band in each hand. Your palms should face each other. Sit in a stable chair without armrests, or stand. Step back, away from the secured end of the exercise band, until the band stretches. Squeeze your shoulder blades together and lift your hands over your head. Keep your elbows straight. Hold for 3 seconds. Slowly return to the starting position. Repeat for a total of 10 repetitions. Repeat 2 times. Complete this exercise 3 times per week.  This information is not intended to replace advice given to you by your health care provider. Make sure you discuss any questions you have with your health care provider. Document Released: 01/10/2005 Document Revised: 09/17/2015 Document Reviewed: 11/27/2014 [employer] Interactive Patient Education  2017 [employer].

## 2018-10-27 NOTE — ED Provider Notes (Signed)
[name]    CSN: [id number] Arrival date & time: 10/27/18  1724  Musculoskeletal Exam  Patient: [name] [name] DOB: 1985/07/07  DOS: 10/27/2018  SUBJECTIVE:  Chief Complaint:   Chief Complaint  Patient presents with  . Flank Pain    [name] [name] is a 33 y.o.  female for evaluation and treatment of L side pain.   Onset:  1 day ago. No inj, did carry a window AC unit the other day by herself.  Location: L side and up to shoulder Character:  aching and sharp  Progression of issue:  is unchanged Associated symptoms: mild swelling, no bruising or redness or rash Treatment: to date has been OTC NSAIDS (Aleve).   Neurovascular symptoms: no  ROS:  Musculoskeletal/Extremities: +side pain Neuro: No paresthesias  Past Medical History:  Diagnosis Date  . Depression    post partum  . 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 History of chlamydia   . Migraine     Objective: VITAL SIGNS: BP 118/62 (BP Location: Right Arm)   Pulse 96   Temp 97.7 F (36.5 C) (Oral)   Resp 16   Wt 68 kg   SpO2 98%   BMI 27.44 kg/m  Constitutional: Well formed, well developed. No acute distress. Cardiovascular: Brisk cap refill Thorax & Lungs: No accessory muscle use Musculoskeletal: L shoulder.   Normal active range of motion: yes.   Normal passive range of motion: yes Tenderness to palpation: yes over distal lat dorsi, trap Deformity: no Ecchymosis: no Tests positive: none Tests negative: Spurling's Neurologic: Normal sensory function. No focal deficits noted. DTR's equal and symmetric in UE's. No clonus. Psychiatric: Normal mood. Age appropriate judgment and insight. Alert & oriented x 3.     Final Clinical Impressions(s) / UC Diagnoses   Final diagnoses:  Strain of latissimus dorsi muscle, initial encounter     Discharge Instructions     Heat (pad or rice pillow in microwave) over affected area, 10-15 minutes twice daily.    Ice/cold pack over area for 10-15 min twice daily.  OK to take Tylenol 1000 mg (2 extra strength tabs) or 975 mg (3 regular strength tabs) every 6 hours as needed.  Take Flexeril (cyclobenzaprine) 1-2 hours before planned bedtime. If it makes you drowsy, do not take during the day. You can try half a tab the following night.  Do not drink alcohol, do any illicit/street drugs, drive or do anything that requires alertness while on the tramadol.   Trapezius stretches/exercises Do exercises exactly as told by your health care provider and adjust them as directed. It is normal to feel mild stretching, pulling, tightness, or discomfort as you do these exercises, but you should stop right away if you feel sudden pain or your pain gets worse.   Stretching and range of motion exercises These exercises warm up your muscles and joints and improve the movement and flexibility of your shoulder. These exercises can also help to relieve pain, numbness, and tingling. If you are unable to do any of the following for any reason, do not further attempt to do it.   Exercise A: Flexion, standing     1. Stand and hold a broomstick, a cane, or a similar object. Place your hands a little more than shoulder-width apart on the object. Your left / right hand should be palm-up, and your other hand should be palm-down. 2. Push the stick to raise your left / right arm  out to your side and then over your head. Use your other hand to help move the stick. Stop when you feel a stretch in your shoulder, or when you reach the angle that is recommended by your health care provider. ? Avoid shrugging your shoulder while you raise your arm. Keep your shoulder blade tucked down toward your spine. 3. Hold for 30 seconds. 4. Slowly return to the starting position. Repeat 2 times. Complete this exercise 3 times per week.  Exercise B: Abduction, supine     1. Lie on your back and hold a broomstick, a cane, or a similar object.  Place your hands a little more than shoulder-width apart on the object. Your left / right hand should be palm-up, and your other hand should be palm-down. 2. Push the stick to raise your left / right arm out to your side and then over your head. Use your other hand to help move the stick. Stop when you feel a stretch in your shoulder, or when you reach the angle that is recommended by your health care provider. ? Avoid shrugging your shoulder while you raise your arm. Keep your shoulder blade tucked down toward your spine. 3. Hold for 30 seconds. 4. Slowly return to the starting position. Repeat 2 times. Complete this exercise 3 times per week.  Exercise C: Flexion, active-assisted     1. Lie on your back. You may bend your knees for comfort. 2. Hold a broomstick, a cane, or a similar object. Place your hands about shoulder-width apart on the object. Your palms should face toward your feet. 3. Raise the stick and move your arms over your head and behind your head, toward the floor. Use your healthy arm to help your left / right arm move farther. Stop when you feel a gentle stretch in your shoulder, or when you reach the angle where your health care provider tells you to stop. 4. Hold for 30 seconds. 5. Slowly return to the starting position. Repeat 2 times. Complete this exercise 3 times per week.  Exercise D: External rotation and abduction     1. Stand in a door frame with one of your feet slightly in front of the other. This is called a staggered stance. 2. Choose one of the following positions as told by your health care provider: ? Place your hands and forearms on the door frame above your head. ? Place your hands and forearms on the door frame at the height of your head. ? Place your hands on the door frame at the height of your elbows. 3. Slowly move your weight onto your front foot until you feel a stretch across your chest and in the front of your shoulders. Keep your head and chest  upright and keep your abdominal muscles tight. 4. Hold for 30 seconds. 5. To release the stretch, shift your weight to your back foot. Repeat 2 times. Complete this stretch 3 times per week.  Strengthening exercises These exercises build strength and endurance in your shoulder. Endurance is the ability to use your muscles for a long time, even after your muscles get tired. Exercise E: Scapular depression and adduction  1. Sit on a stable chair. Support your arms in front of you with pillows, armrests, or a tabletop. Keep your elbows in line with the sides of your body. 2. Gently move your shoulder blades down toward your middle back. Relax the muscles on the tops of your shoulders and in the back of your  neck. 3. Hold for 3 seconds. 4. Slowly release the tension and relax your muscles completely before doing this exercise again. Repeat for a total of 10 repetitions. 5. After you have practiced this exercise, try doing the exercise without the arm support. Then, try the exercise while standing instead of sitting. Repeat 2 times. Complete this exercise 3 times per week.  Exercise F: Shoulder abduction, isometric     1. Stand or sit about 4-6 inches (10-15 cm) from a wall with your left / right side facing the wall. 2. Bend your left / right elbow and gently press your elbow against the wall. 3. Increase the pressure slowly until you are pressing as hard as you can without shrugging your shoulder. 4. Hold for 3 seconds. 5. Slowly release the tension and relax your muscles completely. Repeat for a total of 10 repetitions. Repeat 2 times. Complete this exercise 3 times per week.  Exercise G: Shoulder flexion, isometric     1. Stand or sit about 4-6 inches (10-15 cm) away from a wall with your left / right side facing the wall. 2. Keep your left / right elbow straight and gently press the top of your fist against the wall. Increase the pressure slowly until you are pressing as hard as you can  without shrugging your shoulder. 3. Hold for 10-15 seconds. 4. Slowly release the tension and relax your muscles completely. Repeat for a total of 10 repetitions. Repeat 2 times. Complete this exercise 3 times per week.  Exercise H: Internal rotation     1. Sit in a stable chair without armrests, or stand. Secure an exercise band at your left / right side, at elbow height. 2. Place a soft object, such as a folded towel or a small pillow, under your left / right upper arm so your elbow is a few inches (about 8 cm) away from your side. 3. Hold the end of the exercise band so the band stretches. 4. Keeping your elbow pressed against the soft object under your arm, move your forearm across your body toward your abdomen. Keep your body steady so the movement is only coming from your shoulder. 5. Hold for 3 seconds. 6. Slowly return to the starting position. Repeat for a total of 10 repetitions. Repeat 2 times. Complete this exercise 3 times per week.  Exercise I: External rotation     1. Sit in a stable chair without armrests, or stand. 2. Secure an exercise band at your left / right side, at elbow height. 3. Place a soft object, such as a folded towel or a small pillow, under your left / right upper arm so your elbow is a few inches (about 8 cm) away from your side. 4. Hold the end of the exercise band so the band stretches. 5. Keeping your elbow pressed against the soft object under your arm, move your forearm out, away from your abdomen. Keep your body steady so the movement is only coming from your shoulder. 6. Hold for 3 seconds. 7. Slowly return to the starting position. Repeat for a total of 10 repetitions. Repeat 2 times. Complete this exercise 3 times per week. Exercise J: Shoulder extension  1. Sit in a stable chair without armrests, or stand. Secure an exercise band to a stable object in front of you so the band is at shoulder height. 2. Hold one end of the exercise band in each  hand. Your palms should face each other. 3. Straighten your elbows and  lift your hands up to shoulder height. 4. Step back, away from the secured end of the exercise band, until the band stretches. 5. Squeeze your shoulder blades together and pull your hands down to the sides of your thighs. Stop when your hands are straight down by your sides. Do not let your hands go behind your body. 6. Hold for 3 seconds. 7. Slowly return to the starting position. Repeat for a total of 10 repetitions. Repeat 2 times. Complete this exercise 3 times per week.  Exercise K: Shoulder extension, prone     1. Lie on your abdomen on a firm surface so your left / right arm hangs over the edge. 2. Hold a 5 lb weight in your hand so your palm faces in toward your body. Your arm should be straight. 3. Squeeze your shoulder blade down toward the middle of your back. 4. Slowly raise your arm behind you, up to the height of the surface that you are lying on. Keep your arm straight. 5. Hold for 3 seconds. 6. Slowly return to the starting position and relax your muscles. Repeat for a total of 10 repetitions. Repeat 2 times. Complete this exercise 3 times per week.   Exercise L: Horizontal abduction, prone  1. Lie on your abdomen on a firm surface so your left / right arm hangs over the edge. 2. Hold a 5 lb weight in your hand so your palm faces toward your feet. Your arm should be straight. 3. Squeeze your shoulder blade down toward the middle of your back. 4. Bend your elbow so your hand moves up, until your elbow is bent to an "L" shape (90 degrees). With your elbow bent, slowly move your forearm forward and up. Raise your hand up to the height of the surface that you are lying on. ? Your upper arm should not move, and your elbow should stay bent. ? At the top of the movement, your palm should face the floor. 5. Hold for 3 seconds. 6. Slowly return to the starting position and relax your muscles. Repeat for a total  of 10 repetitions. Repeat 2 times. Complete this exercise 3 times per week.  Exercise M: Horizontal abduction, standing  1. Sit on a stable chair, or stand. 2. Secure an exercise band to a stable object in front of you so the band is at shoulder height. 3. Hold one end of the exercise band in each hand. 4. Straighten your elbows and lift your hands straight in front of you, up to shoulder height. Your palms should face down, toward the floor. 5. Step back, away from the secured end of the exercise band, until the band stretches. 6. Move your arms out to your sides, and keep your arms straight. 7. Hold for 3 seconds. 8. Slowly return to the starting position. Repeat for a total of 10 repetitions. Repeat 2 times. Complete this exercise 3 times per week.  Exercise N: Scapular retraction and elevation  1. Sit on a stable chair, or stand. 2. Secure an exercise band to a stable object in front of you so the band is at shoulder height. 3. Hold one end of the exercise band in each hand. Your palms should face each other. 4. Sit in a stable chair without armrests, or stand. 5. Step back, away from the secured end of the exercise band, until the band stretches. 6. Squeeze your shoulder blades together and lift your hands over your head. Keep your elbows straight. 7. Hold  for 3 seconds. 8. Slowly return to the starting position. Repeat for a total of 10 repetitions. Repeat 2 times. Complete this exercise 3 times per week.  This information is not intended to replace advice given to you by your health care provider. Make sure you discuss any questions you have with your health care provider. Document Released: 01/10/2005 Document Revised: 09/17/2015 Document Reviewed: 11/27/2014 Elsevier Interactive Patient Education  2017 [employer].      ED Prescriptions    Medication Sig Dispense Auth. Provider   meloxicam (MOBIC) 15 MG tablet Take 1 tablet (15 mg total) by mouth daily. 20 tablet  Sharlene Dory, DO   cyclobenzaprine (FLEXERIL) 10 MG tablet Take 0.5-1 tablets (5-10 mg total) by mouth 3 (three) times daily as needed for muscle spasms. 20 tablet Sharlene Dory, DO   traMADol (ULTRAM) 50 MG tablet Take 1 tablet (50 mg total) by mouth every 12 (twelve) hours as needed for severe pain. 12 tablet Sharlene Dory, DO        Arva Chafe [place], [place] 10/27/18 [phone]

## 2018-11-19 ENCOUNTER — Encounter (HOSPITAL_COMMUNITY): Payer: Self-pay

## 2018-11-19 ENCOUNTER — Emergency Department (HOSPITAL_COMMUNITY)
Admission: EM | Admit: 2018-11-19 | Discharge: 2018-11-20 | Disposition: A | Discharge status: Home / Self Care | Payer: Medicare Other | Attending: Emergency Medicine | Admitting: Emergency Medicine

## 2018-11-19 ENCOUNTER — Other Ambulatory Visit: Payer: Self-pay

## 2018-11-19 DIAGNOSIS — T1490XA Injury, unspecified, initial encounter: Secondary | ICD-10-CM

## 2018-11-19 DIAGNOSIS — Z79899 Other long term (current) drug therapy: Secondary | ICD-10-CM | POA: Insufficient documentation

## 2018-11-19 DIAGNOSIS — Z87891 Personal history of nicotine dependence: Secondary | ICD-10-CM | POA: Insufficient documentation

## 2018-11-19 DIAGNOSIS — R519 Headache, unspecified: Secondary | ICD-10-CM | POA: Diagnosis not present

## 2018-11-19 DIAGNOSIS — R42 Dizziness and giddiness: Secondary | ICD-10-CM | POA: Diagnosis not present

## 2018-11-19 DIAGNOSIS — T520X1A Toxic effect of petroleum products, accidental (unintentional), initial encounter: Secondary | ICD-10-CM | POA: Insufficient documentation

## 2018-11-19 DIAGNOSIS — R0602 Shortness of breath: Secondary | ICD-10-CM | POA: Diagnosis not present

## 2018-11-19 DIAGNOSIS — T50904A Poisoning by unspecified drugs, medicaments and biological substances, undetermined, initial encounter: Secondary | ICD-10-CM | POA: Diagnosis not present

## 2018-11-19 DIAGNOSIS — J688 Other respiratory conditions due to chemicals, gases, fumes and vapors: Secondary | ICD-10-CM | POA: Diagnosis not present

## 2018-11-19 LAB — CBC WITH DIFFERENTIAL/PLATELET
Abs Immature Granulocytes: 0.07 10*3/uL (ref 0.00–0.07)
Basophils Absolute: 0.1 10*3/uL (ref 0.0–0.1)
Basophils Relative: 0 %
Eosinophils Absolute: 0.2 10*3/uL (ref 0.0–0.5)
Eosinophils Relative: 2 %
HCT: 37.4 % (ref 36.0–46.0)
Hemoglobin: 12 g/dL (ref 12.0–15.0)
Immature Granulocytes: 0 %
Lymphocytes Relative: 23 %
Lymphs Abs: 3.7 10*3/uL (ref 0.7–4.0)
MCH: 28.4 pg (ref 26.0–34.0)
MCHC: 32.1 g/dL (ref 30.0–36.0)
MCV: 88.6 fL (ref 80.0–100.0)
Monocytes Absolute: 0.8 10*3/uL (ref 0.1–1.0)
Monocytes Relative: 5 %
Neutro Abs: 11 10*3/uL — ABNORMAL HIGH (ref 1.7–7.7)
Neutrophils Relative %: 70 %
Platelets: 342 10*3/uL (ref 150–400)
RBC: 4.22 MIL/uL (ref 3.87–5.11)
RDW: 12.8 % (ref 11.5–15.5)
WBC: 15.9 10*3/uL — ABNORMAL HIGH (ref 4.0–10.5)
nRBC: 0 % (ref 0.0–0.2)

## 2018-11-19 LAB — BASIC METABOLIC PANEL
Anion gap: 8 (ref 5–15)
BUN: 13 mg/dL (ref 6–20)
CO2: 23 mmol/L (ref 22–32)
Calcium: 8.6 mg/dL — ABNORMAL LOW (ref 8.9–10.3)
Chloride: 105 mmol/L (ref 98–111)
Creatinine, Ser: 0.99 mg/dL (ref 0.44–1.00)
GFR calc Af Amer: >60 mL/min (ref >60)
GFR calc non Af Amer: >60 mL/min (ref >60)
Glucose, Bld: 91 mg/dL (ref 70–99)
Potassium: 4.2 mmol/L (ref 3.5–5.1)
Sodium: 136 mmol/L (ref 135–145)

## 2018-11-19 LAB — CARBOXYHEMOGLOBIN - COOX: Carboxyhemoglobin: 5.1 % (ref 0.5–1.5)
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
## 2018-11-19 NOTE — ED Notes (Signed)
Date and time results received: 11/19/18 10:48 PM  (use smartphrase ".now" to insert current time)  Test: Hemoglobin  Critical Value: 5.1  Name of Provider Notified: [place]  Orders Received? Or Actions Taken?: notified

## 2018-11-20 ENCOUNTER — Encounter: Payer: Self-pay | Admitting: Neurology

## 2018-11-20 ENCOUNTER — Ambulatory Visit (INDEPENDENT_AMBULATORY_CARE_PROVIDER_SITE_OTHER): Payer: Medicare Other | Admitting: Neurology

## 2018-11-20 ENCOUNTER — Other Ambulatory Visit: Payer: Self-pay

## 2018-11-20 VITALS — BP 113/70 | HR 84 | Temp 97.7°F | Ht 62.0 in | Wt 155.0 lb

## 2018-11-20 DIAGNOSIS — G43709 Chronic migraine without aura, not intractable, without status migrainosus: Secondary | ICD-10-CM

## 2018-11-20 DIAGNOSIS — R531 Weakness: Secondary | ICD-10-CM | POA: Diagnosis not present

## 2018-11-20 DIAGNOSIS — Q860 Fetal alcohol syndrome (dysmorphic): Secondary | ICD-10-CM | POA: Diagnosis not present

## 2018-11-20 DIAGNOSIS — IMO0002 Reserved for concepts with insufficient information to code with codable children: Secondary | ICD-10-CM

## 2018-11-20 MED ORDER — RIZATRIPTAN BENZOATE 10 MG PO TBDP: 10.0000 mg | ORAL_TABLET | ORAL | 1 refills | Status: DC | PRN

## 2018-11-20 MED ORDER — NORTRIPTYLINE HCL 10 MG PO CAPS: 20.0000 mg | ORAL_CAPSULE | Freq: Every day | ORAL | 4 refills | Status: DC

## 2018-11-20 NOTE — Progress Notes (Signed)
PATIENT: [name] [name] DOB: [birth year]  Chief Complaint  Patient presents with  . Migraine    She is here with her significant other, [name] [name]. Last seen 04/25/2017.  She stopped nortriptyline and rizatriptan in June after running out of refills.  She feels the medications work well for her migraines and would like to restart treatment.       HISTORICAL  [name] [name] is a 33 year old female, seen in refer by ENT Dr. Billy Fischer for evaluation of headaches, primary care physician is Dr. Midge Aver, [facility], initial evaluation was on January 26, 2017.  I reviewed and summarized the referring note, she has past medical history of depression, fetal alcohol syndrome.  She reported history of migraine headaches since 2011, her typical migraine are lateralized severe pounding headache with associated light noise, smell sensitivity, lasting for a few hours to 1 day.  She reported increased frequency of migraine headaches since 2018, can happen every other day, took sumatriptan 3-4 hours to help her headaches.  Trigger for her migraines are stress, bright light, strong smells,  Since 2018, she also noticed intermittent left facial, arm numbness during intense headaches,  We have personally reviewed CT head without contrast in October 2017 there was no significant abnormality.  UPDATE April 25 2017: MRI brain on April 23 2017 was normal.   She has been taking nortriptyline 10 mg up to 2 tablets every night as migraine prevention, Maxalt as needed was helpful, she rarely has migraine.  UPDATE Nov 20 2018: She presented to the emergency room November 19, 2018 for shortness of breath, there was a kerosene heater left inside the home of her mother, she came to the house and was overcome by fumes.  She felt weakness, headache, shortness of breath, laboratory evaluation showed elevated carboxyhemoglobin 5.1, normal BMP, WBC was 15.9, negative HIV, RPR,  She now complains of  holoacranial headache, generalized weakness, previous nortriptyline works well for her headache, Maxalt was helpful, but she has run out of prescription  REVIEW OF SYSTEMS: Full 14 system review of systems performed and notable only for migraine headaches  ALLERGIES: Allergies  Allergen Reactions  . Ibuprofen Swelling    Eyes swelled   . Septra [Sulfamethoxazole-Trimethoprim] Hives    legs    HOME MEDICATIONS: Current Outpatient Medications  Medication Sig Dispense Refill  . cetirizine (ZYRTEC) 10 MG tablet Take 1 tablet (10 mg total) by mouth daily. 30 tablet 0  . cyclobenzaprine (FLEXERIL) 10 MG tablet Take 0.5-1 tablets (5-10 mg total) by mouth 3 (three) times daily as needed for muscle spasms. 20 tablet 0  . etonogestrel (NEXPLANON) 68 MG IMPL implant 1 each by Subdermal route once.     . fluticasone (FLONASE) 50 MCG/ACT nasal spray Place 1 spray into both nostrils daily. 16 g 2  . nortriptyline (PAMELOR) 10 MG capsule Take 2 capsules (20 mg total) by mouth at bedtime. Please call [phone] to scheduled a follow up appt. 180 capsule 0  . permethrin (ELIMITE) 5 % cream Apply from chin down, leave on for 8-14 hours, rinse. Repeat in 1 week 60 g 1  . rizatriptan (MAXALT-MLT) 10 MG disintegrating tablet TAKE 1 TABLET BY MOUTH AS NEEDED FOR MIGRAINE. MAY REPEAT ONCE IN 2 HOURS IF NEEDED. 12 tablet 1  . traMADol (ULTRAM) 50 MG tablet Take 1 tablet (50 mg total) by mouth every 12 (twelve) hours as needed for severe pain. 12 tablet 0   No current facility-administered medications for this  visit.    Facility-Administered Medications Ordered in Other Visits  Medication Dose Route Frequency Provider Last Rate Last Dose  . rho(d) immune globulin (RHIG/Rhophylac) injection 300 mcg  300 mcg Intramuscular Once Frederico Hamman, MD        PAST MEDICAL HISTORY: Past Medical History:  Diagnosis Date  . Depression    post partum  . Fetal alcohol syndrome    high functioning mental  retardation  . History of chlamydia   . Migraine     PAST SURGICAL HISTORY: Past Surgical History:  Procedure Laterality Date  . APPENDECTOMY    . EYE SURGERY    . TYMPANOSTOMY TUBE PLACEMENT      FAMILY HISTORY: Family History  Problem Relation Age of Onset  . Alcohol abuse Mother   . Seizures Father   . Alcohol abuse Father     SOCIAL HISTORY:  Social History   Socioeconomic History  . Marital status: Single    Spouse name: Not on file  . Number of children: 4  . Years of education: 1 year college  . Highest education level: Not on file  Occupational History  . Occupation: Unemployed  Social Needs  . Financial resource strain: Not on file  . Food insecurity    Worry: Not on file    Inability: Not on file  . Transportation needs    Medical: Not on file    Non-medical: Not on file  Tobacco Use  . Smoking status: Former Smoker    Types: Cigarettes  . Smokeless tobacco: Never Used  Substance and Sexual Activity  . Alcohol use: Yes    Comment: Rare use  . Drug use: No  . Sexual activity: Yes    Birth control/protection: None  Lifestyle  . Physical activity    Days per week: Not on file    Minutes per session: Not on file  . Stress: Not on file  Relationships  . Social Herbalist on phone: Not on file    Gets together: Not on file    Attends religious service: Not on file    Active member of club or organization: Not on file    Attends meetings of clubs or organizations: Not on file    Relationship status: Not on file  . Intimate partner violence    Fear of current or ex partner: Not on file    Emotionally abused: Not on file    Physically abused: Not on file    Forced sexual activity: Not on file  Other Topics Concern  . Not on file  Social History Narrative   Lives at home with her children.   Right-handed.   1 to 1.5 cups caffeine per day.     PHYSICAL EXAM   Vitals:   11/20/18 0710  BP: 113/70  Pulse: 84  Temp: 97.7 F  (36.5 C)  Weight: 155 lb (70.3 kg)  Height: 5\' 2"  (1.575 m)    Not recorded      Body mass index is 28.35 kg/m.  PHYSICAL EXAMNIATION:  Gen: NAD, conversant, well nourised, well groomed                     Cardiovascular: Regular rate rhythm, no peripheral edema, warm, nontender. Eyes: Conjunctivae clear without exudates or hemorrhage Neck: Supple, no carotid bruits. Pulmonary: Clear to auscultation bilaterally   NEUROLOGICAL EXAM:  MENTAL STATUS: Speech:    Speech is normal; fluent and spontaneous with normal comprehension.  Cognition:     Orientation to time, place and person     Normal recent and remote memory     Normal Attention span and concentration     Normal Language, naming, repeating,spontaneous speech     Fund of knowledge   CRANIAL NERVES: CN II: Visual fields are full to confrontation.   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 Left ptosis. CN V: Facial sensation is intact to pinprick in all 3 divisions bilaterally. Corneal responses are intact.  CN VII: Asymmetry of face, lower lying left upper eyelid. CN VIII: Hearing is normal to rubbing fingers CN IX, X: Palate elevates symmetrically. Phonation is normal. CN XI: Head turning and shoulder shrug are intact CN XII: Tongue is midline with normal movements and no atrophy.  MOTOR: There is no pronator drift of out-stretched arms. Muscle bulk and tone are normal. Muscle strength is normal.  REFLEXES: Reflexes are 2+ and symmetric at the biceps, triceps, knees, and ankles. Plantar responses are flexor.  SENSORY: Intact to light touch, pinprick, positional sensation and vibratory sensation are intact in fingers and toes.  COORDINATION: Rapid alternating movements and fine finger movements are intact. There is no dysmetria on finger-to-nose and heel-knee-shin.    GAIT/STANCE: She need assistant to get up from seated position, unsteady gait   DIAGNOSTIC DATA (LABS,  IMAGING, TESTING) - I reviewed patient records, labs, notes, testing and imaging myself where available.   ASSESSMENT AND PLAN  [name] [name] is a 33 y.o. female   Chronic migraine headaches, with associated left hemiparesthesia Fetal alcohol syndrome Kerosene fume exposure on Oct 26th 2020,  MRI of the brain was normal.  Restart preventive medication nortriptyline 10 mg 2 tablest every night  Maxalt 10 mg as needed   Continue follow-up with her primary care physician  Levert FeinsteinYijun Brieonna Crutcher, M.D. Ph.D.  [facility] [address], [address] [place], [place] [address] Ph: [phone] Fax: [phone]  ZH:[id number]:Pavelock, Duke Salviaichard M, MD

## 2018-11-20 NOTE — Discharge Instructions (Addendum)
Return to the emergency department if symptoms significantly worsen or change. 

## 2018-11-21 ENCOUNTER — Telehealth: Payer: Self-pay | Admitting: Neurology

## 2018-11-21 LAB — CBC WITH DIFFERENTIAL/PLATELET
Basophils Absolute: 0 10*3/uL (ref 0.0–0.2)
Basos: 0 %
EOS (ABSOLUTE): 0.2 10*3/uL (ref 0.0–0.4)
Eos: 2 %
Hematocrit: 39.7 % (ref 34.0–46.6)
Hemoglobin: 12.7 g/dL (ref 11.1–15.9)
Immature Grans (Abs): 0 10*3/uL (ref 0.0–0.1)
Immature Granulocytes: 0 %
Lymphocytes Absolute: 2.7 10*3/uL (ref 0.7–3.1)
Lymphs: 25 %
MCH: 27.9 pg (ref 26.6–33.0)
MCHC: 32 g/dL (ref 31.5–35.7)
MCV: 87 fL (ref 79–97)
Monocytes Absolute: 0.7 10*3/uL (ref 0.1–0.9)
Monocytes: 7 %
Neutrophils Absolute: 7 10*3/uL (ref 1.4–7.0)
Neutrophils: 66 %
Platelets: 232 10*3/uL (ref 150–450)
RBC: 4.56 x10E6/uL (ref 3.77–5.28)
RDW: 12.5 % (ref 11.7–15.4)
WBC: 10.7 10*3/uL (ref 3.4–10.8)

## 2018-11-21 LAB — TSH: TSH: 1.77 u[IU]/mL (ref 0.450–4.500)

## 2018-11-21 LAB — CK: Total CK: 261 U/L — ABNORMAL HIGH (ref 32–182)

## 2018-11-21 NOTE — Telephone Encounter (Signed)
Please call patient, laboratory evaluation showed mild elevated CPK 261, this is unknown clinical significance, may consider repeat test at next evaluations,  TSH and CBC were normal.

## 2018-11-21 NOTE — Telephone Encounter (Signed)
I was able to speak with the patient to provide her the lab results below.  She verbalized understanding.

## 2019-01-03 ENCOUNTER — Other Ambulatory Visit: Payer: Self-pay | Admitting: Neurology

## 2019-03-21 ENCOUNTER — Ambulatory Visit: Payer: Medicare Other | Admitting: Neurology

## 2019-04-17 ENCOUNTER — Other Ambulatory Visit: Payer: Self-pay

## 2019-04-17 ENCOUNTER — Encounter (HOSPITAL_COMMUNITY): Payer: Self-pay | Admitting: Emergency Medicine

## 2019-04-17 ENCOUNTER — Ambulatory Visit (HOSPITAL_COMMUNITY)
Admission: EM | Admit: 2019-04-17 | Discharge: 2019-04-17 | Disposition: A | Discharge status: Home / Self Care | Payer: Medicare HMO | Attending: Internal Medicine | Admitting: Internal Medicine

## 2019-04-17 DIAGNOSIS — B9689 Other specified bacterial agents as the cause of diseases classified elsewhere: Secondary | ICD-10-CM | POA: Insufficient documentation

## 2019-04-17 DIAGNOSIS — N760 Acute vaginitis: Secondary | ICD-10-CM | POA: Diagnosis present

## 2019-04-17 DIAGNOSIS — Z3202 Encounter for pregnancy test, result negative: Secondary | ICD-10-CM

## 2019-04-17 LAB — POCT PREGNANCY, URINE: Preg Test, Ur: NEGATIVE

## 2019-04-17 LAB — POCT URINALYSIS DIP (DEVICE)
Bilirubin Urine: NEGATIVE
Glucose, UA: NEGATIVE mg/dL
Hgb urine dipstick: NEGATIVE
Ketones, ur: NEGATIVE mg/dL
Leukocytes,Ua: NEGATIVE
Nitrite: NEGATIVE
Protein, ur: NEGATIVE mg/dL
Specific Gravity, Urine: 1.02 (ref 1.005–1.030)
Urobilinogen, UA: 1 mg/dL (ref 0.0–1.0)
pH: 7 (ref 5.0–8.0)

## 2019-04-17 LAB — POC URINE PREG, ED: Preg Test, Ur: NEGATIVE

## 2019-04-17 MED ORDER — METRONIDAZOLE 500 MG PO TABS: 500.0000 mg | ORAL_TABLET | Freq: Two times a day (BID) | ORAL | 0 refills | Status: DC

## 2019-04-17 NOTE — ED Triage Notes (Signed)
Vaginal odor and irritation for 4 days. Slight dysuria

## 2019-04-18 LAB — CERVICOVAGINAL ANCILLARY ONLY
Bacterial vaginitis: POSITIVE — AB
Candida vaginitis: NEGATIVE
Chlamydia: NEGATIVE
Neisseria Gonorrhea: NEGATIVE
Trichomonas: NEGATIVE

## 2019-04-18 NOTE — ED Provider Notes (Signed)
[facility]    CSN: [id number] Arrival date & time: 04/17/19  3825      History   Chief Complaint Chief Complaint  Patient presents with  . Vaginal Discharge    HPI [name] [name] is a 34 y.o. female comes to urgent care with foul-smelling vaginal discharge and vaginal irritation of 4 days duration.  Patient has slight dysuria but no urgency or frequency.  Discharge is pain without pruritus.  No abdominal pain, nausea or vomiting.  Patient is sexually active and has no deep dyspareunia.  No nausea or vomiting.  No fever or chills.  Last menstrual period was in February.  HPI  Past Medical History:  Diagnosis Date  . Depression    post partum  . 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 History of chlamydia   . Migraine     Patient Active Problem List   Diagnosis Date Noted  . Weakness 11/20/2018  . Fetal alcohol syndrome 11/20/2018  . Chronic migraine 01/26/2017  . Paresthesia 01/26/2017  . Active labor 11/04/2013  . Normal delivery at term 11/04/2013  . Normal delivery 06/04/2011    Past Surgical History:  Procedure Laterality Date  . APPENDECTOMY    . EYE SURGERY    . TYMPANOSTOMY TUBE PLACEMENT      OB History    Gravida  4   Para  4   Term  4   Preterm  0   AB  0   Living  4     SAB  0   TAB  0   Ectopic  0   Multiple  0   Live Births  4            Home Medications    Prior to Admission medications   Medication Sig Start Date End Date Taking? Authorizing Provider  etonogestrel (NEXPLANON) 68 MG IMPL implant 1 each by Subdermal route once.    Yes [provider]  fluticasone (FLONASE) 50 MCG/ACT nasal spray Place 1 spray into both nostrils daily. 07/03/18  Yes Bast, Traci A, NP  nortriptyline (PAMELOR) 10 MG capsule Take 2 capsules (20 mg total) by mouth at bedtime. 11/20/18  Yes Marcial Pacas, MD  rizatriptan (MAXALT-MLT) 10 MG disintegrating tablet Take 1 tab at onset of migraine.  May  repeat in 2 hrs, if needed.  Max dose: 2 tabs/day. This is a 30 day prescription. 01/03/19  Yes Marcial Pacas, MD  traMADol (ULTRAM) 50 MG tablet Take 1 tablet (50 mg total) by mouth every 12 (twelve) hours as needed for severe pain. 10/27/18  Yes Shelda Pal, DO  cetirizine (ZYRTEC) 10 MG tablet Take 1 tablet (10 mg total) by mouth daily. 07/03/18   Loura Halt A, NP  cyclobenzaprine (FLEXERIL) 10 MG tablet Take 0.5-1 tablets (5-10 mg total) by mouth 3 (three) times daily as needed for muscle spasms. 10/27/18   Shelda Pal, DO  metroNIDAZOLE (FLAGYL) 500 MG tablet Take 1 tablet (500 mg total) by mouth 2 (two) times daily. 04/17/19   LampteyMyrene Galas, MD  permethrin (ELIMITE) 5 % cream Apply from chin down, leave on for 8-14 hours, rinse. Repeat in 1 week 10/19/18   Melynda Ripple, MD    Family History Family History  Problem Relation Age of Onset  . Alcohol abuse Mother   . Seizures Father   . Alcohol abuse Father     Social History Social History   Tobacco Use  .  Smoking status: Former Smoker    Types: Cigarettes  . Smokeless tobacco: Never Used  Substance Use Topics  . Alcohol use: Yes    Comment: Rare use  . Drug use: No     Allergies   Ibuprofen and Septra [sulfamethoxazole-trimethoprim]   Review of Systems Review of Systems  Constitutional: Negative for activity change, chills and fever.  Gastrointestinal: Negative for abdominal pain, diarrhea, nausea and vomiting.  Genitourinary: Positive for dysuria and frequency. Negative for vaginal discharge.  Musculoskeletal: Negative for arthralgias, joint swelling and myalgias.  Skin: Negative for pallor and rash.  Neurological: Negative for dizziness, light-headedness and numbness.     Physical Exam Triage Vital Signs ED Triage Vitals  Enc Vitals Group     BP 04/17/19 0950 98/60     Pulse Rate 04/17/19 0947 76     Resp 04/17/19 0947 16     Temp 04/17/19 0947 98.2 F (36.8 C)     Temp src --       SpO2 04/17/19 0947 99 %     Weight --      Height --      Head Circumference --      Peak Flow --      Pain Score 04/17/19 0945 5     Pain Loc --      Pain Edu? --      Excl. in GC? --    No data found.  Updated Vital Signs BP 98/60   Pulse 76   Temp 98.2 F (36.8 C)   Resp 16   LMP 03/05/2019   SpO2 99%   Visual Acuity Right Eye Distance:   Left Eye Distance:   Bilateral Distance:    Right Eye Near:   Left Eye Near:    Bilateral Near:     Physical Exam Vitals and nursing note reviewed.  Constitutional:      General: She is not in acute distress.    Appearance: She is not ill-appearing.  Cardiovascular:     Rate and Rhythm: Normal rate and regular rhythm.     Pulses: Normal pulses.     Heart sounds: Normal heart sounds.  Pulmonary:     Effort: Pulmonary effort is normal. No respiratory distress.     Breath sounds: No stridor.  Abdominal:     General: Bowel sounds are normal. There is no distension.     Tenderness: There is no guarding or rebound.  Skin:    General: Skin is warm.     Capillary Refill: Capillary refill takes less than 2 seconds.  Neurological:     General: No focal deficit present.     Mental Status: She is alert. Mental status is at baseline.      UC Treatments / Results  Labs (all labs ordered are listed, but only abnormal results are displayed) Labs Reviewed  POC URINE PREG, ED  POCT URINALYSIS DIP (DEVICE)  POCT PREGNANCY, URINE  CERVICOVAGINAL ANCILLARY ONLY    EKG   Radiology No results found.  Procedures Procedures (including critical care time)  Medications Ordered in UC Medications - No data to display  Initial Impression / Assessment and Plan / UC Course  I have reviewed the triage vital signs and the nursing notes.  Pertinent labs & imaging results that were available during my care of the patient were reviewed by me and considered in my medical decision making (see chart for details).     1.  Bacterial  vaginosis: Metronidazole 500 mg  twice daily for 7 days Pregnancy test is negative Cervical vaginal swab for GC/chlamydia If test results are significant, treatment plan will be updated to reflect the test results.  Patient is encouraged to review results in MyChart. Final Clinical Impressions(s) / UC Diagnoses   Final diagnoses:  BV (bacterial vaginosis)   Discharge Instructions   None    ED Prescriptions    Medication Sig Dispense Auth. Provider   metroNIDAZOLE (FLAGYL) 500 MG tablet Take 1 tablet (500 mg total) by mouth 2 (two) times daily. 14 tablet Verlena Marlette, Britta Mccreedy, MD     PDMP not reviewed this encounter.   Merrilee Jansky, MD 04/18/19 1254

## 2019-05-16 ENCOUNTER — Ambulatory Visit: Payer: Medicare HMO | Admitting: Neurology

## 2019-05-23 ENCOUNTER — Encounter: Payer: Self-pay | Admitting: Neurology

## 2019-05-23 ENCOUNTER — Ambulatory Visit (INDEPENDENT_AMBULATORY_CARE_PROVIDER_SITE_OTHER): Payer: Medicare HMO | Admitting: Neurology

## 2019-05-23 ENCOUNTER — Other Ambulatory Visit: Payer: Self-pay

## 2019-05-23 VITALS — BP 108/68 | HR 53 | Ht 63.0 in | Wt 152.0 lb

## 2019-05-23 DIAGNOSIS — G43709 Chronic migraine without aura, not intractable, without status migrainosus: Secondary | ICD-10-CM

## 2019-05-23 DIAGNOSIS — IMO0002 Reserved for concepts with insufficient information to code with codable children: Secondary | ICD-10-CM

## 2019-05-23 MED ORDER — RIZATRIPTAN BENZOATE 10 MG PO TBDP: ORAL_TABLET | ORAL | 5 refills | Status: DC

## 2019-05-23 MED ORDER — VENLAFAXINE HCL ER 37.5 MG PO CP24: ORAL_CAPSULE | ORAL | 3 refills | Status: DC

## 2019-05-23 NOTE — Patient Instructions (Signed)
Stop the nortriptyline  Start Effexor-XR 37.5 mg capsule, take 1 at bedtime x 2 weeks, then take 2, if you like this let me know, I will send in the 75 mg capsule Continue Maxalt as needed See you back in 6 months  Venlafaxine extended-release capsules What is this medicine? VENLAFAXINE(VEN la fax een) is used to treat depression, anxiety and panic disorder. This medicine may be used for other purposes; ask your health care provider or pharmacist if you have questions. COMMON BRAND NAME(S): Effexor XR What should I tell my health care provider before I take this medicine? They need to know if you have any of these conditions:  bleeding disorders  glaucoma  heart disease  high blood pressure  high cholesterol  kidney disease  liver disease  low levels of sodium in the blood  mania or bipolar disorder  seizures  suicidal thoughts, plans, or attempt; a previous suicide attempt by you or a family  take medicines that treat or prevent blood clots  thyroid disease  an unusual or allergic reaction to venlafaxine, desvenlafaxine, other medicines, foods, dyes, or preservatives  pregnant or trying to get pregnant  breast-feeding How should I use this medicine? Take this medicine by mouth with a full glass of water. Follow the directions on the prescription label. Do not cut, crush, or chew this medicine. Take it with food. If needed, the capsule may be carefully opened and the entire contents sprinkled on a spoonful of cool applesauce. Swallow the applesauce/pellet mixture right away without chewing and follow with a glass of water to ensure complete swallowing of the pellets. Try to take your medicine at about the same time each day. Do not take your medicine more often than directed. Do not stop taking this medicine suddenly except upon the advice of your doctor. Stopping this medicine too quickly may cause serious side effects or your condition may worsen. A special MedGuide  will be given to you by the pharmacist with each prescription and refill. Be sure to read this information carefully each time. Talk to your pediatrician regarding the use of this medicine in children. Special care may be needed. Overdosage: If you think you have taken too much of this medicine contact a poison control center or emergency room at once. NOTE: This medicine is only for you. Do not share this medicine with others. What if I miss a dose? If you miss a dose, take it as soon as you can. If it is almost time for your next dose, take only that dose. Do not take double or extra doses. What may interact with this medicine? Do not take this medicine with any of the following medications:  certain medicines for fungal infections like fluconazole, itraconazole, ketoconazole, posaconazole, voriconazole  cisapride  desvenlafaxine  dronedarone  duloxetine  levomilnacipran  linezolid  MAOIs like Carbex, Eldepryl, Marplan, Nardil, and Parnate  methylene blue (injected into a vein)  milnacipran  pimozide  thioridazine This medicine may also interact with the following medications:  amphetamines  aspirin and aspirin-like medicines  certain medicines for depression, anxiety, or psychotic disturbances  certain medicines for migraine headaches like almotriptan, eletriptan, frovatriptan, naratriptan, rizatriptan, sumatriptan, zolmitriptan  certain medicines for sleep  certain medicines that treat or prevent blood clots like dalteparin, enoxaparin, warfarin  cimetidine  clozapine  diuretics  fentanyl  furazolidone  indinavir  isoniazid  lithium  metoprolol  NSAIDS, medicines for pain and inflammation, like ibuprofen or naproxen  other medicines that prolong the QT  interval (cause an abnormal heart rhythm) like dofetilide, ziprasidone  procarbazine  rasagiline  supplements like St. John's wort, kava kava, valerian  tramadol  tryptophan This list may  not describe all possible interactions. Give your health care provider a list of all the medicines, herbs, non-prescription drugs, or dietary supplements you use. Also tell them if you smoke, drink alcohol, or use illegal drugs. Some items may interact with your medicine. What should I watch for while using this medicine? Tell your doctor if your symptoms do not get better or if they get worse. Visit your doctor or health care professional for regular checks on your progress. Because it may take several weeks to see the full effects of this medicine, it is important to continue your treatment as prescribed by your doctor. Patients and their families should watch out for new or worsening thoughts of suicide or depression. Also watch out for sudden changes in feelings such as feeling anxious, agitated, panicky, irritable, hostile, aggressive, impulsive, severely restless, overly excited and hyperactive, or not being able to sleep. If this happens, especially at the beginning of treatment or after a change in dose, call your health care professional. This medicine can cause an increase in blood pressure. Check with your doctor for instructions on monitoring your blood pressure while taking this medicine. You may get drowsy or dizzy. Do not drive, use machinery, or do anything that needs mental alertness until you know how this medicine affects you. Do not stand or sit up quickly, especially if you are an older patient. This reduces the risk of dizzy or fainting spells. Alcohol may interfere with the effect of this medicine. Avoid alcoholic drinks. Your mouth may get dry. Chewing sugarless gum, sucking hard candy and drinking plenty of water will help. Contact your doctor if the problem does not go away or is severe. What side effects may I notice from receiving this medicine? Side effects that you should report to your doctor or health care professional as soon as possible:  allergic reactions like skin rash,  itching or hives, swelling of the face, lips, or tongue  anxious  breathing problems  confusion  changes in vision  chest pain  confusion  elevated mood, decreased need for sleep, racing thoughts, impulsive behavior  eye pain  fast, irregular heartbeat  feeling faint or lightheaded, falls  feeling agitated, angry, or irritable  hallucination, loss of contact with reality  high blood pressure  loss of balance or coordination  palpitations  redness, blistering, peeling or loosening of the skin, including inside the mouth  restlessness, pacing, inability to keep still  seizures  stiff muscles  suicidal thoughts or other mood changes  trouble passing urine or change in the amount of urine  trouble sleeping  unusual bleeding or bruising  unusually weak or tired  vomiting Side effects that usually do not require medical attention (report to your doctor or health care professional if they continue or are bothersome):  change in sex drive or performance  change in appetite or weight  constipation  dizziness  dry mouth  headache  increased sweating  nausea  tired This list may not describe all possible side effects. Call your doctor for medical advice about side effects. You may report side effects to FDA at 1-800-FDA-1088. Where should I keep my medicine? Keep out of the reach of children. Store at a controlled temperature between 20 and 25 degrees C (68 degrees and 77 degrees F), in a dry place. Throw away  any unused medicine after the expiration date. NOTE: This sheet is a summary. It may not cover all possible information. If you have questions about this medicine, talk to your doctor, pharmacist, or health care provider.  2020 Elsevier/Gold Standard (2018-01-02 12:06:43)

## 2019-05-23 NOTE — Progress Notes (Addendum)
PATIENT: [name] [name] DOB: January 26, 1985  REASON FOR VISIT: follow up HISTORY FROM: patient  HISTORY OF PRESENT ILLNESS: Today 05/23/19  HISTORY [name] [name] is a 34 year old female, seen in refer by ENT Dr. Lind Guest for evaluation of headaches, primary care physician is Dr. Alphonzo Grieve, Ralene Bathe, initial evaluation was on January 26, 2017.  I reviewed and summarized the referring note, she has past medical history of depression, fetal alcohol syndrome.  She reported history of migraine headaches since 2011, her typical migraine are lateralized severe pounding headache with associated light noise, smell sensitivity, lasting for a few hours to 1 day.  She reported increased frequency of migraine headaches since 2018, can happen every other day, took sumatriptan 3-4 hours to help her headaches.  Trigger for her migraines are stress, bright light, strong smells,  Since 2018, she also noticed intermittent left facial, arm numbness during intense headaches,  We have personally reviewed CT head without contrast in October 2017 there was no significant abnormality.  UPDATE April 25 2017: MRI brain on April 23 2017 was normal.   She has been taking nortriptyline 10 mg up to 2 tablets every night as migraine prevention, Maxalt as needed was helpful, she rarely has migraine.  UPDATE Nov 20 2018: She presented to the emergency room November 19, 2018 for shortness of breath, there was a kerosene heater left inside the home of her mother, she came to the house and was overcome by fumes.  She felt weakness, headache, shortness of breath, laboratory evaluation showed elevated carboxyhemoglobin 5.1, normal BMP, WBC was 15.9, negative HIV, RPR,  She now complains of holoacranial headache, generalized weakness, previous nortriptyline works well for her headache, Maxalt was helpful, but she has run out of prescription  Update May 23, 2019 SS: Here today for follow-up,  unaccompanied for migraine headache.  She has not been taking nortriptyline consistently, says when outside, it makes her skin burn.  Unclear, if it was beneficial for headache.  She has been taking Maxalt with good benefit. On average she is having 2-4 migraines a week.  Reportedly she reports increased stress, her mom passed away earlier this year, she has 4 kids, she does not work, feels overwhelmed.  She recently had a new Nexplanon inserted.  Goes to the health department, has been told she is overweight.  Laboratory evaluation showed elevated CPK 261, TSH was normal  REVIEW OF SYSTEMS: Out of a complete 14 system review of symptoms, the patient complains only of the following symptoms, and all other reviewed systems are negative.  Headache  ALLERGIES: Allergies  Allergen Reactions  . Ibuprofen Swelling    Eyes swelled   . Septra [Sulfamethoxazole-Trimethoprim] Hives    legs    HOME MEDICATIONS: Outpatient Medications Prior to Visit  Medication Sig Dispense Refill  . cyclobenzaprine (FLEXERIL) 10 MG tablet Take 0.5-1 tablets (5-10 mg total) by mouth 3 (three) times daily as needed for muscle spasms. 20 tablet 0  . etonogestrel (NEXPLANON) 68 MG IMPL implant 1 each by Subdermal route once.     . fluticasone (FLONASE) 50 MCG/ACT nasal spray Place 1 spray into both nostrils daily. 16 g 2  . permethrin (ELIMITE) 5 % cream Apply from chin down, leave on for 8-14 hours, rinse. Repeat in 1 week 60 g 1  . traMADol (ULTRAM) 50 MG tablet Take 1 tablet (50 mg total) by mouth every 12 (twelve) hours as needed for severe pain. 12 tablet 0  . nortriptyline (PAMELOR) 10  MG capsule Take 2 capsules (20 mg total) by mouth at bedtime. 180 capsule 4  . rizatriptan (MAXALT-MLT) 10 MG disintegrating tablet Take 1 tab at onset of migraine.  May repeat in 2 hrs, if needed.  Max dose: 2 tabs/day. This is a 30 day prescription. 12 tablet 5  . cetirizine (ZYRTEC) 10 MG tablet Take 1 tablet (10 mg total) by  mouth daily. 30 tablet 0  . metroNIDAZOLE (FLAGYL) 500 MG tablet Take 1 tablet (500 mg total) by mouth 2 (two) times daily. 14 tablet 0   Facility-Administered Medications Prior to Visit  Medication Dose Route Frequency Provider Last Rate Last Admin  . rho(d) immune globulin (RHIG/Rhophylac) injection 300 mcg  300 mcg Intramuscular Once Kathreen Cosier, MD        PAST MEDICAL HISTORY: Past Medical History:  Diagnosis Date  . Depression    post partum  . 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 History of chlamydia   . Migraine     PAST SURGICAL HISTORY: Past Surgical History:  Procedure Laterality Date  . APPENDECTOMY    . EYE SURGERY    . TYMPANOSTOMY TUBE PLACEMENT      FAMILY HISTORY: Family History  Problem Relation Age of Onset  . Alcohol abuse Mother   . Seizures Father   . Alcohol abuse Father     SOCIAL HISTORY: Social History   Socioeconomic History  . Marital status: Single    Spouse name: Not on file  . Number of children: 4  . Years of education: 1 year college  . Highest education level: Not on file  Occupational History  . Occupation: Unemployed  Tobacco Use  . Smoking status: Former Smoker    Types: Cigarettes  . Smokeless tobacco: Never Used  Substance and Sexual Activity  . Alcohol use: Yes    Comment: Rare use  . Drug use: No  . Sexual activity: Yes    Birth control/protection: None  Other Topics Concern  . Not on file  Social History Narrative   Lives at home with her children.   Right-handed.   1 to 1.5 cups caffeine per day.   Social Determinants of Health   Financial Resource Strain:   . Difficulty of Paying Living Expenses:   Food Insecurity:   . Worried About Programme researcher, broadcasting/film/video in the Last Year:   . Barista in the Last Year:   Transportation Needs:   . Freight forwarder (Medical):   Marland Kitchen Lack of Transportation (Non-Medical):   Physical Activity:   . Days of Exercise per Week:   .  Minutes of Exercise per Session:   Stress:   . Feeling of Stress :   Social Connections:   . Frequency of Communication with Friends and Family:   . Frequency of Social Gatherings with Friends and Family:   . Attends Religious Services:   . Active Member of Clubs or Organizations:   . Attends Banker Meetings:   Marland Kitchen Marital Status:   Intimate Partner Violence:   . Fear of Current or Ex-Partner:   . Emotionally Abused:   Marland Kitchen Physically Abused:   . Sexually Abused:    PHYSICAL EXAM  Vitals:   05/23/19 1057  BP: 108/68  Pulse: (!) 53  Weight: 152 lb (68.9 kg)  Height: 5\' 3"  (1.6 m)   Body mass index is 26.93 kg/m.  Generalized: Well developed, in no acute distress   Neurological  examination  Mentation: Alert oriented to time, place, history taking. Follows all commands speech and language fluent Cranial nerve II-XII: Left eye is lagging, underdeveloped,  Facial sensation and strength were normal. Head turning and shoulder shrug were normal and symmetric. Motor: The motor testing reveals 5 over 5 strength of all 4 extremities. Good symmetric motor tone is noted throughout. Wear left wrist splint Sensory: Sensory testing is intact to soft touch on all 4 extremities. No evidence of extinction is noted.  Coordination: Cerebellar testing reveals good finger-nose-finger and heel-to-shin bilaterally.  Gait and station: Gait is normal. Tandem gait is normal. Romberg is negative. No drift is seen.  Reflexes: Deep tendon reflexes are symmetric and normal bilaterally.   DIAGNOSTIC DATA (LABS, IMAGING, TESTING) - I reviewed patient records, labs, notes, testing and imaging myself where available.  Lab Results  Component Value Date   WBC 10.7 11/20/2018   HGB 12.7 11/20/2018   HCT 39.7 11/20/2018   MCV 87 11/20/2018   PLT 232 11/20/2018      Component Value Date/Time   NA 136 11/19/2018 2230   K 4.2 11/19/2018 2230   CL 105 11/19/2018 2230   CO2 23 11/19/2018 2230    GLUCOSE 91 11/19/2018 2230   BUN 13 11/19/2018 2230   CREATININE 0.99 11/19/2018 2230   CALCIUM 8.6 (L) 11/19/2018 2230   PROT 8.1 02/02/2018 2006   ALBUMIN 4.4 02/02/2018 2006   AST 20 02/02/2018 2006   ALT 17 02/02/2018 2006   ALKPHOS 66 02/02/2018 2006   BILITOT 0.9 02/02/2018 2006   GFRNONAA >60 11/19/2018 2230   GFRAA >60 11/19/2018 2230   No results found for: CHOL, HDL, LDLCALC, LDLDIRECT, TRIG, CHOLHDL No results found for: JWJX9J No results found for: VITAMINB12 Lab Results  Component Value Date   TSH 1.770 11/20/2018    ASSESSMENT AND PLAN 34 y.o. year old female  has a past medical history of Depression, Fetal alcohol syndrome, History of chlamydia, and Migraine. here with:  1.  Chronic migraine headaches, with associated left hemiparesthesia 2.  Fetal alcohol syndrome 3.  Kerosene fume exposure on November 19, 2018 -MRI of the brain was normal -Reports intolerance to nortriptyline, made her skin burn -Will start Effexor XR 37.5 mg, working up to 75 mg at bedtime for migraine prevention, could also benefit anxiety, stress, sent 3 months, let me know how she likes it, will then send in 75 mg capsule -Continue Maxalt as needed -Will come by the office at her convenience to have CK rechecked, at last visit was elevated for unknown reason 261 -We will follow-up in 6 months or sooner if needed  I spent 20 minutes of face-to-face and non-face-to-face time with patient.  This included previsit chart review, lab review, study review, order entry, electronic health record documentation, patient education.  Margie Ege, AGNP-C, DNP 05/23/2019, 11:39 AM [facility] [address], [address] [place], [place] [address] [phone]

## 2019-05-27 ENCOUNTER — Telehealth: Payer: Self-pay

## 2019-05-27 MED ORDER — VENLAFAXINE HCL ER 75 MG PO CP24: 75.0000 mg | ORAL_CAPSULE | Freq: Every day | ORAL | 3 refills | Status: DC

## 2019-05-27 MED ORDER — VENLAFAXINE HCL ER 37.5 MG PO CP24: ORAL_CAPSULE | ORAL | 0 refills | Status: DC

## 2019-05-27 NOTE — Telephone Encounter (Signed)
LM on the VM for the patient to call back 

## 2019-05-27 NOTE — Telephone Encounter (Addendum)
Received noticed from [facility] that pt's insurance will not cover effexor 37.5mg  Xr two tablets. They requested two separate RXs for the 37.5mg  and the 75mg .  I spoke with , NP. Received VO to change order to effexor 75mg  XR QHS after pt finishes effexor 37.5mg  XR QHS for two weeks. Pt also should be reminded to come get her CK level drawn.

## 2019-06-04 NOTE — Progress Notes (Signed)
I have reviewed and agreed above plan. 

## 2019-06-17 NOTE — Telephone Encounter (Signed)
LM on the VM for the patient to call back re: medication  I will mail a letter to the patient

## 2019-06-26 ENCOUNTER — Other Ambulatory Visit: Payer: Self-pay | Admitting: Neurology

## 2019-07-03 ENCOUNTER — Encounter: Payer: Self-pay | Admitting: Obstetrics and Gynecology

## 2019-07-03 ENCOUNTER — Other Ambulatory Visit: Payer: Self-pay

## 2019-07-03 ENCOUNTER — Ambulatory Visit (INDEPENDENT_AMBULATORY_CARE_PROVIDER_SITE_OTHER): Payer: Medicare HMO | Admitting: Obstetrics and Gynecology

## 2019-07-03 ENCOUNTER — Other Ambulatory Visit (HOSPITAL_COMMUNITY)
Admission: RE | Admit: 2019-07-03 | Discharge: 2019-07-03 | Disposition: A | Discharge status: Home / Self Care | Payer: Medicare HMO | Source: Ambulatory Visit | Attending: Obstetrics and Gynecology | Admitting: Obstetrics and Gynecology

## 2019-07-03 VITALS — BP 110/64 | HR 55 | Wt 150.2 lb

## 2019-07-03 DIAGNOSIS — R87611 Atypical squamous cells cannot exclude high grade squamous intraepithelial lesion on cytologic smear of cervix (ASC-H): Secondary | ICD-10-CM

## 2019-07-03 DIAGNOSIS — Z3202 Encounter for pregnancy test, result negative: Secondary | ICD-10-CM | POA: Diagnosis not present

## 2019-07-03 DIAGNOSIS — N870 Mild cervical dysplasia: Secondary | ICD-10-CM

## 2019-07-03 LAB — POCT PREGNANCY, URINE: Preg Test, Ur: NEGATIVE

## 2019-07-03 NOTE — Progress Notes (Signed)
Colposcopy Procedure Note  ETHELDA DEANGELO is a 34 y.o. [id number] here for colposcopy.  Indications:  Pap 05/14/19: ASCUS, cannot exclude high grade, positive HPV  Procedure Details  LMP unknown (on Nexplanon); UPT negative.    The risks (including infection, bleeding, pain) and benefits of the procedure were explained to the patient and written informed consent was obtained.  The patient was placed in the dorsal lithotomy position. A Graves was speculum inserted in the vagina, and the cervix was visualized.  The cervix was stained with acetic acid and visualized using the colposcope under magnification as well as with a green filter. Findings as below. Cervical biopsies were taken at 12, 4 & 9 o'clock. Endocervical curettage then performed in all four quadrants. Small amount of bleeding noted that improved with pressure. Monsel's solution applied with good hemostasis noted. Patient tolerating procedure well.  Findings: increased vascularity 360 degrees, acetowhite at 9 o'clock, ectropion prominent  Impression: low grade  Adequate: yes  Specimens:  1. Cervical biopsy 12, 4, 9 o'clock 2. Endocervical curettage  Condition: Stable  Complications: None  Plan: The patient was advised to call for any fever or for prolonged or severe pain or bleeding. She was advised to use OTC analgesics as needed for mild to moderate pain. She was advised to avoid vaginal intercourse for 48 hours or until the bleeding has completely stopped.  Will base further management on results of biopsy.   Baldemar Lenis, M.D. [facility]ending [facility] [employer] [employer] [facility])

## 2019-07-03 NOTE — Addendum Note (Signed)
Addended by: Maxwell Marion E on: 07/03/2019 04:44 PM   Modules accepted: Orders

## 2019-07-03 NOTE — Progress Notes (Signed)
GAD-7 positive. Pt not taking Effexor due to concern for side effects. Reports recent death of mother. Hospice is following up with patient for counseling services. Encouraged pt to return their call and schedule an appt.   Fleet Contras RN 07/03/19

## 2019-07-05 LAB — SURGICAL PATHOLOGY

## 2019-07-16 ENCOUNTER — Telehealth: Payer: Self-pay

## 2019-07-16 NOTE — Telephone Encounter (Signed)
-----   Message from Conan Bowens, MD sent at 07/15/2019  3:43 PM EDT ----- Needs repeat pap 1 year, please call patient and let her know

## 2019-07-16 NOTE — Telephone Encounter (Signed)
Advised of results and patient verbalized understanding and asked if I could tell her neighbor what I said about repeating a pap in 1 year, I told her neighbor that we just wanted to repeat a pap in one year to check progress they both verbalized understanding and ended the call.

## 2019-09-12 ENCOUNTER — Encounter: Payer: Self-pay | Admitting: *Deleted

## 2019-09-27 ENCOUNTER — Encounter: Payer: Self-pay | Admitting: General Practice

## 2019-09-27 ENCOUNTER — Emergency Department (HOSPITAL_COMMUNITY): Payer: Medicare HMO

## 2019-09-27 ENCOUNTER — Encounter (HOSPITAL_COMMUNITY): Payer: Self-pay | Admitting: *Deleted

## 2019-09-27 ENCOUNTER — Emergency Department (HOSPITAL_COMMUNITY)
Admission: EM | Admit: 2019-09-27 | Discharge: 2019-09-27 | Disposition: A | Discharge status: Home / Self Care | Payer: Medicare HMO | Attending: Emergency Medicine | Admitting: Emergency Medicine

## 2019-09-27 DIAGNOSIS — U071 COVID-19: Secondary | ICD-10-CM | POA: Diagnosis not present

## 2019-09-27 DIAGNOSIS — M791 Myalgia, unspecified site: Secondary | ICD-10-CM

## 2019-09-27 DIAGNOSIS — Z87891 Personal history of nicotine dependence: Secondary | ICD-10-CM | POA: Insufficient documentation

## 2019-09-27 DIAGNOSIS — R059 Cough, unspecified: Secondary | ICD-10-CM

## 2019-09-27 DIAGNOSIS — R05 Cough: Secondary | ICD-10-CM | POA: Diagnosis present

## 2019-09-27 LAB — I-STAT CHEM 8, ED
BUN: 4 mg/dL — ABNORMAL LOW (ref 6–20)
Calcium, Ion: 1.1 mmol/L — ABNORMAL LOW (ref 1.15–1.40)
Chloride: 101 mmol/L (ref 98–111)
Creatinine, Ser: 0.8 mg/dL (ref 0.44–1.00)
Glucose, Bld: 85 mg/dL (ref 70–99)
HCT: 40 % (ref 36.0–46.0)
Hemoglobin: 13.6 g/dL (ref 12.0–15.0)
Potassium: 3.5 mmol/L (ref 3.5–5.1)
Sodium: 141 mmol/L (ref 135–145)
TCO2: 26 mmol/L (ref 22–32)

## 2019-09-27 MED ORDER — PROMETHAZINE-DM 6.25-15 MG/5ML PO SYRP: 5.0000 mL | ORAL_SOLUTION | Freq: Four times a day (QID) | ORAL | 0 refills | Status: DC | PRN

## 2019-09-27 MED ORDER — ONDANSETRON 4 MG PO TBDP: 4.0000 mg | ORAL_TABLET | Freq: Three times a day (TID) | ORAL | 0 refills | Status: DC | PRN

## 2019-09-27 MED ORDER — KETOROLAC TROMETHAMINE 15 MG/ML IJ SOLN
15.0000 mg | Freq: Once | INTRAMUSCULAR | Status: AC
  Administered 2019-09-27: 15 mg via INTRAVENOUS
  Filled 2019-09-27: qty 1

## 2019-09-27 MED ORDER — BENZONATATE 100 MG PO CAPS: 100.0000 mg | ORAL_CAPSULE | Freq: Three times a day (TID) | ORAL | 0 refills | Status: DC

## 2019-09-27 MED ORDER — ONDANSETRON HCL 4 MG/2ML IJ SOLN
4.0000 mg | Freq: Once | INTRAMUSCULAR | Status: AC
  Administered 2019-09-27: 4 mg via INTRAVENOUS
  Filled 2019-09-27: qty 2

## 2019-09-27 MED ORDER — SODIUM CHLORIDE 0.9 % IV BOLUS
1000.0000 mL | Freq: Once | INTRAVENOUS | Status: AC
  Administered 2019-09-27: 1000 mL via INTRAVENOUS

## 2019-09-27 NOTE — ED Provider Notes (Signed)
[facility] EMERGENCY DEPARTMENT Provider Note   CSN: [id number] Arrival date & time: 09/27/19  0805     History Chief Complaint  Patient presents with  . Back Pain  . Cough  . Covid Positive    [name] [name] is a 34 y.o. female presenting for evaluation of body aches and cough.  Patient states she is not feeling well.  She reports body aches, which worsened significantly today.  A lot of her pain is in her back.  It is a general pain, no focal or pinpoint pain.  Patient reports associated cough.  She was diagnosed with Covid yesterday.  She is unclear as to exactly when her symptoms began, but approximately 1 to 2 weeks ago.  She has not received her Covid vaccines.  She reports loss of taste and decreased appetite.  She has associated nasal congestion.  She denies chest pain, shortness of breath, abdominal pain, urinary symptoms, normal bowel movements.  Patient states she has not eaten in 2 days due to not feeling well, as such she is feeling weak and lightheaded.  HPI     Past Medical History:  Diagnosis Date  . Depression    post partum  . 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 History of chlamydia   . Migraine     Patient Active Problem List   Diagnosis Date Noted  . Weakness 11/20/2018  . Fetal alcohol syndrome 11/20/2018  . Chronic migraine 01/26/2017  . Paresthesia 01/26/2017  . Active labor 11/04/2013  . Normal delivery at term 11/04/2013  . Normal delivery 06/04/2011    Past Surgical History:  Procedure Laterality Date  . APPENDECTOMY    . EYE SURGERY    . TYMPANOSTOMY TUBE PLACEMENT       OB History    Gravida  4   Para  4   Term  4   Preterm  0   AB  0   Living  4     SAB  0   TAB  0   Ectopic  0   Multiple  0   Live Births  4           Family History  Problem Relation Age of Onset  . Alcohol abuse Mother   . Seizures Father   . Alcohol abuse Father     Social History    Tobacco Use  . Smoking status: Former Smoker    Types: Cigarettes  . Smokeless tobacco: Never Used  Vaping Use  . Vaping Use: Never used  Substance Use Topics  . Alcohol use: Yes    Comment: Rare use  . Drug use: No    Home Medications Prior to Admission medications   Medication Sig Start Date End Date Taking? Authorizing Provider  cyclobenzaprine (FLEXERIL) 10 MG tablet Take 0.5-1 tablets (5-10 mg total) by mouth 3 (three) times daily as needed for muscle spasms. 10/27/18  Yes Wendling, Jilda Roche, DO  etonogestrel (NEXPLANON) 68 MG IMPL implant 1 each by Subdermal route once.    Yes [provider]  fluticasone (FLONASE) 50 MCG/ACT nasal spray Place 1 spray into both nostrils daily. 07/03/18  Yes Bast, Traci A, NP  rizatriptan (MAXALT-MLT) 10 MG disintegrating tablet Take 1 tab at onset of migraine.  May repeat in 2 hrs, if needed.  Max dose: 2 tabs/day. This is a 30 day prescription. 05/23/19  Yes Glean Salvo, NP  traMADol (ULTRAM) 50 MG tablet  Take 1 tablet (50 mg total) by mouth every 12 (twelve) hours as needed for severe pain. 10/27/18  Yes Sharlene Dory, DO  venlafaxine XR (EFFEXOR XR) 37.5 MG 24 hr capsule Take 1 capsule at bedtime for 2 weeks. Then start effexor 75mg  capsule at bedtime. 05/27/19  Yes 07/27/19, NP  benzonatate (TESSALON) 100 MG capsule Take 1 capsule (100 mg total) by mouth every 8 (eight) hours. 09/27/19   Swetha Rayle, PA-C  ondansetron (ZOFRAN ODT) 4 MG disintegrating tablet Take 1 tablet (4 mg total) by mouth every 8 (eight) hours as needed for nausea or vomiting. 09/27/19   Linetta Regner, PA-C  promethazine-dextromethorphan (PROMETHAZINE-DM) 6.25-15 MG/5ML syrup Take 5 mLs by mouth 4 (four) times daily as needed for cough. 09/27/19   Muskaan Smet, PA-C  venlafaxine XR (EFFEXOR-XR) 75 MG 24 hr capsule TAKE 1 CAPSULE (75 MG TOTAL) BY MOUTH AT BEDTIME. AFTER COMPLETING 2 WEEKS OF EFFEXOR 37.5MG  AT BEDTIME. Patient not taking:  Reported on 07/03/2019 06/26/19   08/26/19, NP    Allergies    Ibuprofen and Septra [sulfamethoxazole-trimethoprim]  Review of Systems   Review of Systems  Constitutional: Positive for appetite change.  Respiratory: Positive for cough.   Musculoskeletal: Positive for myalgias.  Neurological: Positive for weakness.  All other systems reviewed and are negative.   Physical Exam Updated Vital Signs BP 122/86   Pulse 93   Temp 98.8 F (37.1 C) (Oral)   Resp 18   Ht 5\' 3"  (1.6 m)   Wt 64 kg   SpO2 99%   BMI 24.98 kg/m   Physical Exam Vitals and nursing note reviewed.  Constitutional:      General: She is not in acute distress.    Appearance: She is well-developed.     Comments: Resting in the bed in no acute distress  HENT:     Head: Normocephalic and atraumatic.  Eyes:     Conjunctiva/sclera: Conjunctivae normal.     Pupils: Pupils are equal, round, and reactive to light.  Cardiovascular:     Rate and Rhythm: Normal rate and regular rhythm.     Pulses: Normal pulses.  Pulmonary:     Effort: Pulmonary effort is normal. No respiratory distress.     Breath sounds: Normal breath sounds. No wheezing.     Comments: Speaking in full sentences.  Clear lung sounds in all fields.  Dry cough noted on exam.  Patient ambulated without hypoxia, sats stayed greater than 98%.  Patient did become mildly tachycardic at 110 with ambulation. Abdominal:     General: There is no distension.     Palpations: Abdomen is soft. There is no mass.     Tenderness: There is no abdominal tenderness. There is no guarding or rebound.  Musculoskeletal:        General: Tenderness present. Normal range of motion.     Cervical back: Normal range of motion and neck supple.     Comments: Diffuse tenderness to palpation of the entire back.  No focal pain.  Skin:    General: Skin is warm and dry.     Capillary Refill: Capillary refill takes less than 2 seconds.  Neurological:     Mental Status: She is  alert and oriented to person, place, and time.     ED Results / Procedures / Treatments   Labs (all labs ordered are listed, but only abnormal results are displayed) Labs Reviewed  I-STAT CHEM 8, ED - Abnormal; Notable for  the following components:      Result Value   BUN 4 (*)    Calcium, Ion 1.10 (*)    All other components within normal limits    EKG None  Radiology DG Chest Portable 1 View  Result Date: 09/27/2019 CLINICAL DATA:  Cough, COVID EXAM: PORTABLE CHEST 1 VIEW COMPARISON:  11/22/2017 FINDINGS: The heart size and mediastinal contours are within normal limits. Both lungs are clear. Stable, definitively benign calcified nodules of the left midlung. The visualized skeletal structures are unremarkable. IMPRESSION: No acute abnormality of the lungs in AP portable projection. Electronically Signed   By: Lauralyn PrimesAlex  Bibbey M.D.   On: 09/27/2019 08:34    Procedures Procedures (including critical care time)  Medications Ordered in ED Medications  sodium chloride 0.9 % bolus 1,000 mL (1,000 mLs Intravenous New Bag/Given 09/27/19 0926)  ondansetron (ZOFRAN) injection 4 mg (4 mg Intravenous Given 09/27/19 0926)  ketorolac (TORADOL) 15 MG/ML injection 15 mg (15 mg Intravenous Given 09/27/19 [id number])    ED Course  I have reviewed the triage vital signs and the nursing notes.  Pertinent labs & imaging results that were available during my care of the patient were reviewed by me and considered in my medical decision making (see chart for details).    MDM Rules/Calculators/A&P                          Patient presenting for evaluation of myalgias, cough, and weakness in the setting of a Covid infection.  On exam, patient appears nontoxic.  She has diffuse tenderness palpation of the back, no chest pain, no shortness of breath.  As such, low suspicion for PE, likely myalgias due to Covid.  Patient also reporting weakness due to not eating or drinking in the past several days.  Vital signs  stable at rest, she does become oddly tachycardic with ambulation.  As such, will give fluids, Zofran, Toradol, and encourage p.o.  On reassessment, patient reports mild improvement of symptoms with fluids, Toradol, and Zofran.  She is tolerating p.o. without difficulty.  She remains nontoxic and without hypoxia.  Discussed continued symptomatic treatment for Covid at home.  Discussed quarantine instructions, and importance of monitoring for respiratory status.  At this time, patient appears safe for discharge.  Return precautions given.  Patient states she understands and agrees to plan.  Billie Ruddyiffany S Biddinger was evaluated in Emergency Department on 09/27/2019 for the symptoms described in the history of present illness. She was evaluated in the context of the global COVID-19 pandemic, which necessitated consideration that the patient might be at risk for infection with the SARS-CoV-2 virus that causes COVID-19. Institutional protocols and algorithms that pertain to the evaluation of patients at risk for COVID-19 are in a state of rapid change based on information released by regulatory bodies including the CDC and federal and state organizations. These policies and algorithms were followed during the patient's care in the ED.  Final Clinical Impression(s) / ED Diagnoses Final diagnoses:  COVID-19  Myalgia  Cough    Rx / DC Orders ED Discharge Orders         Ordered    benzonatate (TESSALON) 100 MG capsule  Every 8 hours        09/27/19 0946    promethazine-dextromethorphan (PROMETHAZINE-DM) 6.25-15 MG/5ML syrup  4 times daily PRN        09/27/19 0946    ondansetron (ZOFRAN ODT) 4 MG disintegrating tablet  Every 8  hours PRN        09/27/19 0946           Alveria Apley, PA-C 09/27/19 1020    Sabino Donovan, MD 09/27/19 1321

## 2019-09-27 NOTE — Discharge Instructions (Signed)
You have Covid. This is a viral illness and should be treated symptomatically. Use Tylenol as needed for fevers or body aches. Use cough drops and syrup as needed for cough. Use Zofran as needed for nausea or vomiting.   Make sure you stay well-hydrated with water. Wash your hands frequently to prevent spread of infection. Return to the emergency room if you develop significant difficulty breathing, especially at rest, or with any new, worsening, or concerning symptoms.

## 2019-09-27 NOTE — ED Triage Notes (Signed)
To ED for eval of back pain and cough. Dx yesterday with covid. Appears in nad. Speaks in full complete sentences. Not vaccinated for covid.

## 2019-11-12 ENCOUNTER — Telehealth: Payer: Self-pay | Admitting: Neurology

## 2019-11-12 NOTE — Telephone Encounter (Signed)
Pt has called  asking to be seen for vertigo & dizziness.  Phone rep informed pt that every diagnosis will require its own referral and that she would need one for  vertigo & dizziness. This is FYI, pt has not asked to be called back.

## 2019-11-13 NOTE — Telephone Encounter (Signed)
Has appt 11-26-19 with SS/NP.  Was to see pcp ofr dizziness.

## 2019-11-20 ENCOUNTER — Encounter (HOSPITAL_COMMUNITY): Payer: Self-pay | Admitting: Emergency Medicine

## 2019-11-20 ENCOUNTER — Other Ambulatory Visit: Payer: Self-pay

## 2019-11-20 ENCOUNTER — Ambulatory Visit (HOSPITAL_COMMUNITY)
Admission: EM | Admit: 2019-11-20 | Discharge: 2019-11-20 | Disposition: A | Discharge status: Home / Self Care | Payer: Medicare HMO | Attending: Family Medicine | Admitting: Family Medicine

## 2019-11-20 DIAGNOSIS — R42 Dizziness and giddiness: Secondary | ICD-10-CM

## 2019-11-20 DIAGNOSIS — H9202 Otalgia, left ear: Secondary | ICD-10-CM

## 2019-11-20 MED ORDER — PREDNISONE 20 MG PO TABS: 40.0000 mg | ORAL_TABLET | Freq: Every day | ORAL | 0 refills | Status: AC

## 2019-11-20 MED ORDER — CETIRIZINE HCL 10 MG PO TABS: 10.0000 mg | ORAL_TABLET | Freq: Every day | ORAL | 0 refills | Status: DC

## 2019-11-20 NOTE — ED Provider Notes (Signed)
[facility]    CSN: [id number] Arrival date & time: 11/20/19  1214      History   Chief Complaint Chief Complaint  Patient presents with   Otalgia    HPI [name] [name] is a 34 y.o. female.   [name] [name] presents with complaints of dizziness and left ear pain which has been ongoing for the past 4 weeks. If she has cotton in the ear canal she doesn't feel dizzy, therefore no dizziness currently during history. States at times she can even fall to the ground due to dizziness. She has chronic migraines, unchanged. Nausea sometimes, no vomiting or diarrhea. Denies any vaginal bleeding or blood in stool. No specific vision changes. No congestion or cough. She was seen with [facility] and had a head ct completed, per her report, which she states was normal. She was given nausea medication as well as what sounds to be meclizine, which she states hasn't helped. She uses daily flonase. States she has been on allergy medications in the past but not currently. States as a child had ear tubes. No palpitations or chest pain.   Patient has to leave to get home, so is quite rushed in obtaining in history, limiting thoroughness    ROS per HPI, negative if not otherwise mentioned.      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   History of chlamydia    Migraine     Patient Active Problem List   Diagnosis Date Noted   Weakness 11/20/2018   Fetal alcohol syndrome 11/20/2018   Chronic migraine 01/26/2017   Paresthesia 01/26/2017   Active labor 11/04/2013   Normal delivery at term 11/04/2013   Normal delivery 06/04/2011    Past Surgical History:  Procedure Laterality Date   APPENDECTOMY     EYE SURGERY     TYMPANOSTOMY TUBE PLACEMENT      OB History    Gravida  4   Para  4   Term  4   Preterm  0   AB  0   Living  4     SAB  0   TAB  0   Ectopic    0   Multiple  0   Live Births  4            Home Medications    Prior to Admission medications   Medication Sig Start Date End Date Taking? Authorizing Provider  benzonatate (TESSALON) 100 MG capsule Take 1 capsule (100 mg total) by mouth every 8 (eight) hours. 09/27/19   Caccavale, Sophia, PA-C  cetirizine (ZYRTEC) 10 MG tablet Take 1 tablet (10 mg total) by mouth daily. 11/20/19   Georgetta Haber, NP  cyclobenzaprine (FLEXERIL) 10 MG tablet Take 0.5-1 tablets (5-10 mg total) by mouth 3 (three) times daily as needed for muscle spasms. 10/27/18   Sharlene Dory, DO  etonogestrel (NEXPLANON) 68 MG IMPL implant 1 each by Subdermal route once.     [provider]  fluticasone (FLONASE) 50 MCG/ACT nasal spray Place 1 spray into both nostrils daily. 07/03/18   Bast, Gloris Manchester A, NP  ondansetron (ZOFRAN ODT) 4 MG disintegrating tablet Take 1 tablet (4 mg total) by mouth every 8 (eight) hours as needed for nausea or vomiting. 09/27/19   Caccavale, Sophia, PA-C  predniSONE (DELTASONE) 20 MG tablet Take 2 tablets (40 mg total) by mouth  daily with breakfast for 5 days. 11/20/19 11/25/19  Georgetta Haber, NP  promethazine-dextromethorphan (PROMETHAZINE-DM) 6.25-15 MG/5ML syrup Take 5 mLs by mouth 4 (four) times daily as needed for cough. 09/27/19   Caccavale, Sophia, PA-C  rizatriptan (MAXALT-MLT) 10 MG disintegrating tablet Take 1 tab at onset of migraine.  May repeat in 2 hrs, if needed.  Max dose: 2 tabs/day. This is a 30 day prescription. 05/23/19   Glean Salvo, NP  traMADol (ULTRAM) 50 MG tablet Take 1 tablet (50 mg total) by mouth every 12 (twelve) hours as needed for severe pain. 10/27/18   Sharlene Dory, DO  venlafaxine XR (EFFEXOR XR) 37.5 MG 24 hr capsule Take 1 capsule at bedtime for 2 weeks. Then start effexor 75mg  capsule at bedtime. 05/27/19   07/27/19, NP  venlafaxine XR (EFFEXOR-XR) 75 MG 24 hr capsule TAKE 1 CAPSULE (75 MG TOTAL) BY MOUTH AT BEDTIME. AFTER  COMPLETING 2 WEEKS OF EFFEXOR 37.5MG  AT BEDTIME. Patient not taking: Reported on 07/03/2019 06/26/19   08/26/19, NP    Family History Family History  Problem Relation Age of Onset   Alcohol abuse Mother    Seizures Father    Alcohol abuse Father     Social History Social History   Tobacco Use   Smoking status: Former Smoker    Types: Cigarettes   Smokeless tobacco: Never Used  Glean Salvo Use: Never used  Substance Use Topics   Alcohol use: Yes    Comment: Rare use   Drug use: No     Allergies   Ibuprofen and Septra [sulfamethoxazole-trimethoprim]   Review of Systems Review of Systems   Physical Exam Triage Vital Signs ED Triage Vitals  Enc Vitals Group     BP 11/20/19 1250 106/74     Pulse Rate 11/20/19 1250 96     Resp 11/20/19 1250 16     Temp 11/20/19 1250 98.4 F (36.9 C)     Temp Source 11/20/19 1250 Oral     SpO2 11/20/19 1250 99 %     Weight --      Height --      Head Circumference --      Peak Flow --      Pain Score 11/20/19 1248 10     Pain Loc --      Pain Edu? --      Excl. in GC? --    No data found.  Updated Vital Signs BP 106/74 (BP Location: Right Arm)    Pulse 96    Temp 98.4 F (36.9 C) (Oral)    Resp 16    SpO2 99%   Visual Acuity Right Eye Distance:   Left Eye Distance:   Bilateral Distance:    Right Eye Near:   Left Eye Near:    Bilateral Near:     Physical Exam Constitutional:      General: She is not in acute distress.    Appearance: She is well-developed.  HENT:     Left Ear: A middle ear effusion is present.     Ears:     Comments: Air fluid to left tm without redness or bulging Eyes:     General: Lids are normal. Vision grossly intact.     Pupils: Pupils are equal, round, and reactive to light.     Comments: Photosensitivity noted on exam   Cardiovascular:     Rate and Rhythm: Normal rate.  Pulmonary:  Effort: Pulmonary effort is normal.  Skin:    General: Skin is warm and dry.    Neurological:     Mental Status: She is alert and oriented to person, place, and time.      UC Treatments / Results  Labs (all labs ordered are listed, but only abnormal results are displayed) Labs Reviewed - No data to display  EKG   Radiology No results found.  Procedures Procedures (including critical care time)  Medications Ordered in UC Medications - No data to display  Initial Impression / Assessment and Plan / UC Course  I have reviewed the triage vital signs and the nursing notes.  Pertinent labs & imaging results that were available during my care of the patient were reviewed by me and considered in my medical decision making (see chart for details).     Exam is limited due to patient in a hurry and stressing she has to leave. Vitals stable. Normal ct with [facility] per patient. No immediate red flags. Air fluid to left tm, encouraged flonaze, zyrtec, and gave 5 days of prednisone to try to clear this up. Return precautions provided. Patient verbalized understanding and agreeable to plan.   Final Clinical Impressions(s) / UC Diagnoses   Final diagnoses:  Dizziness  Otalgia of left ear     Discharge Instructions     Daily zyrtec.  Continue with daily flonase. We will try a few days of prednisone as well to see if this is helpful.  Drink plenty of water to stay hydrated.  Follow up with ENT if persistent, or your primary care provider.   ED Prescriptions    Medication Sig Dispense Auth. Provider   cetirizine (ZYRTEC) 10 MG tablet Take 1 tablet (10 mg total) by mouth daily. 30 tablet Linus Mako B, NP   predniSONE (DELTASONE) 20 MG tablet Take 2 tablets (40 mg total) by mouth daily with breakfast for 5 days. 10 tablet Georgetta Haber, NP     PDMP not reviewed this encounter.   Georgetta Haber, NP 11/20/19 1346

## 2019-11-20 NOTE — Discharge Instructions (Signed)
Daily zyrtec.  Continue with daily flonase. We will try a few days of prednisone as well to see if this is helpful.  Drink plenty of water to stay hydrated.  Follow up with ENT if persistent, or your primary care provider.

## 2019-11-20 NOTE — ED Triage Notes (Signed)
Pt presents with left ear pain and balance issue and dizziness xs 4 weeks. States was seen about 2 weeks ago and flexeril but did not give relief.

## 2019-11-26 ENCOUNTER — Ambulatory Visit: Payer: Medicare HMO | Admitting: Neurology

## 2019-11-28 ENCOUNTER — Encounter: Payer: Self-pay | Admitting: Neurology

## 2019-11-28 ENCOUNTER — Ambulatory Visit (INDEPENDENT_AMBULATORY_CARE_PROVIDER_SITE_OTHER): Payer: Medicare HMO | Admitting: Neurology

## 2019-11-28 DIAGNOSIS — R42 Dizziness and giddiness: Secondary | ICD-10-CM

## 2019-11-28 DIAGNOSIS — G43909 Migraine, unspecified, not intractable, without status migrainosus: Secondary | ICD-10-CM | POA: Insufficient documentation

## 2019-11-28 DIAGNOSIS — G43009 Migraine without aura, not intractable, without status migrainosus: Secondary | ICD-10-CM | POA: Diagnosis not present

## 2019-11-28 MED ORDER — TOPIRAMATE 50 MG PO TABS: 50.0000 mg | ORAL_TABLET | Freq: Every day | ORAL | 5 refills | Status: DC

## 2019-11-28 NOTE — Progress Notes (Signed)
PATIENT: [name] [name] DOB: 03/13/1985  REASON FOR VISIT: follow up HISTORY FROM: patient  HISTORY OF PRESENT ILLNESS: Today 11/28/19  HISTORY [name] S Beattyis a 34 year old female, seen in refer by ENT Dr. Billy Fischer for evaluation of headaches, primary care physician is Dr. Midge Aver, [facility], initial evaluation was on January 26, 2017.  I reviewed and summarized the referring note, she has past medical history of depression, fetal alcohol syndrome.  She reported history of migraine headaches since 2011, her typical migraine are lateralized severe pounding headache with associated light noise, smell sensitivity, lasting for a few hours to 1 day.  She reported increased frequency of migraine headaches since 2018, can happen every other day, took sumatriptan 3-4 hours to help her headaches.  Trigger for her migraines are stress, bright light, strong smells,  Since 2018, she also noticed intermittent left facial, arm numbness during intense headaches,  We have personally reviewed CT head without contrast in October 2017 there was no significant abnormality.  UPDATE April 25 2017: MRI brain on April 23 2017 was normal.   She has been taking nortriptyline 10 mg up to 2 tablets every night as migraine prevention, Maxalt as needed was helpful, she rarely has migraine.  UPDATE Nov 20 2018: She presented to the emergency room November 19, 2018 for shortness of breath, there was a kerosene heater left inside the home of her mother, she came to the house and was overcome by fumes. She felt weakness, headache, shortness of breath, laboratory evaluation showed elevated carboxyhemoglobin 5.1, normal BMP, WBC was 15.9, negative HIV, RPR,  She now complains of holoacranial headache, generalized weakness, previous nortriptyline works well for her headache, Maxalt was helpful, but she has run out of prescription  Update May 23, 2019 SS: Here today for follow-up,  unaccompanied for migraine headache.  She has not been taking nortriptyline consistently, says when outside, it makes her skin burn.  Unclear, if it was beneficial for headache.  She has been taking Maxalt with good benefit. On average she is having 2-4 migraines a week.  Reportedly she reports increased stress, her mom passed away earlier this year, she has 4 kids, she does not work, feels overwhelmed.  She recently had a new Nexplanon inserted.  Goes to the health department, has been told she is overweight.  Laboratory evaluation showed elevated CPK 261, TSH was normal  Update November 28, 2019 SS: Went to urgent care 10/27 for dizziness, pain in the left ear, recommended Flonase, Zyrtec, given 5 days of prednisone.  CT head 11/11/19, showed no acute intracranial abnormalities  Report dizziness for the last month, reports migraines once a week, never started Effexor, worried about" health risk" side effects.  Takes Maxalt as needed.  With dizziness, does describe frontal head pain, dizziness comes and goes, has to wear "tissue" in her left ear to control the dizziness, worse when she looks up, triggered by smells.  Saw PCP, thinks she was given Zofran or meclizine with benefit.  Has some left ear pain, went to urgent care, never took prescribed medications.  Seeing ENT 12/12/19.  Under a lot of stress. Otherwise is baseline, no weakness.  Reported history of vertigo, went away.   REVIEW OF SYSTEMS: Out of a complete 14 system review of symptoms, the patient complains only of the following symptoms, and all other reviewed systems are negative.  Headache, dizziness  ALLERGIES: Allergies  Allergen Reactions  . Ibuprofen Swelling    Eyes swelled   .  Septra [Sulfamethoxazole-Trimethoprim] Hives    legs    HOME MEDICATIONS: Outpatient Medications Prior to Visit  Medication Sig Dispense Refill  . benzonatate (TESSALON) 100 MG capsule Take 1 capsule (100 mg total) by mouth every 8 (eight) hours.  21 capsule 0  . cetirizine (ZYRTEC) 10 MG tablet Take 1 tablet (10 mg total) by mouth daily. 30 tablet 0  . cyclobenzaprine (FLEXERIL) 10 MG tablet Take 0.5-1 tablets (5-10 mg total) by mouth 3 (three) times daily as needed for muscle spasms. 20 tablet 0  . etonogestrel (NEXPLANON) 68 MG IMPL implant 1 each by Subdermal route once.     . fluticasone (FLONASE) 50 MCG/ACT nasal spray Place 1 spray into both nostrils daily. 16 g 2  . ondansetron (ZOFRAN ODT) 4 MG disintegrating tablet Take 1 tablet (4 mg total) by mouth every 8 (eight) hours as needed for nausea or vomiting. 10 tablet 0  . promethazine-dextromethorphan (PROMETHAZINE-DM) 6.25-15 MG/5ML syrup Take 5 mLs by mouth 4 (four) times daily as needed for cough. 118 mL 0  . rizatriptan (MAXALT-MLT) 10 MG disintegrating tablet Take 1 tab at onset of migraine.  May repeat in 2 hrs, if needed.  Max dose: 2 tabs/day. This is a 30 day prescription. 12 tablet 5  . traMADol (ULTRAM) 50 MG tablet Take 1 tablet (50 mg total) by mouth every 12 (twelve) hours as needed for severe pain. 12 tablet 0  . venlafaxine XR (EFFEXOR XR) 37.5 MG 24 hr capsule Take 1 capsule at bedtime for 2 weeks. Then start effexor 75mg  capsule at bedtime. 14 capsule 0  . venlafaxine XR (EFFEXOR-XR) 75 MG 24 hr capsule TAKE 1 CAPSULE (75 MG TOTAL) BY MOUTH AT BEDTIME. AFTER COMPLETING 2 WEEKS OF EFFEXOR 37.5MG  AT BEDTIME. (Patient not taking: Reported on 07/03/2019) 90 capsule 0   Facility-Administered Medications Prior to Visit  Medication Dose Route Frequency Provider Last Rate Last Admin  . rho(d) immune globulin (RHIG/Rhophylac) injection 300 mcg  300 mcg Intramuscular Once 09/02/2019, MD        PAST MEDICAL HISTORY: Past Medical History:  Diagnosis Date  . Depression    post partum  . 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 History of chlamydia   . Migraine     PAST SURGICAL HISTORY: Past Surgical History:  Procedure Laterality Date   . APPENDECTOMY    . EYE SURGERY    . TYMPANOSTOMY TUBE PLACEMENT      FAMILY HISTORY: Family History  Problem Relation Age of Onset  . Alcohol abuse Mother   . Seizures Father   . Alcohol abuse Father     SOCIAL HISTORY: Social History   Socioeconomic History  . Marital status: Single    Spouse name: Not on file  . Number of children: 4  . Years of education: 1 year college  . Highest education level: Not on file  Occupational History  . Occupation: Unemployed  Tobacco Use  . Smoking status: Former Smoker    Types: Cigarettes  . Smokeless tobacco: Never Used  Vaping Use  . Vaping Use: Never used  Substance and Sexual Activity  . Alcohol use: Yes    Comment: Rare use  . Drug use: No  . Sexual activity: Yes    Birth control/protection: None  Other Topics Concern  . Not on file  Social History Narrative   Lives at home with her children.   Right-handed.   1 to 1.5 cups caffeine per day.  Social Determinants of Health   Financial Resource Strain:   . Difficulty of Paying Living Expenses: Not on file  Food Insecurity: No Food Insecurity  . Worried About Programme researcher, broadcasting/film/videounning Out of Food in the Last Year: Never true  . Ran Out of Food in the Last Year: Never true  Transportation Needs: No Transportation Needs  . Lack of Transportation (Medical): No  . Lack of Transportation (Non-Medical): No  Physical Activity:   . Days of Exercise per Week: Not on file  . Minutes of Exercise per Session: Not on file  Stress:   . Feeling of Stress : Not on file  Social Connections:   . Frequency of Communication with Friends and Family: Not on file  . Frequency of Social Gatherings with Friends and Family: Not on file  . Attends Religious Services: Not on file  . Active Member of Clubs or Organizations: Not on file  . Attends BankerClub or Organization Meetings: Not on file  . Marital Status: Not on file  Intimate Partner Violence:   . Fear of Current or Ex-Partner: Not on file  .  Emotionally Abused: Not on file  . Physically Abused: Not on file  . Sexually Abused: Not on file   PHYSICAL EXAM  Vitals:   11/28/19 1020  BP: 118/80  Pulse: 72  Height: 5\' 3"  (1.6 m)   Body mass index is 24.98 kg/m.  Generalized: Well developed, in no acute distress   Neurological examination  Mentation: Alert oriented to time, place, history taking. Follows all commands speech and language fluent Cranial nerve II-XII: Pupils were equal round reactive to light, but left eye is smaller than right (reported baseline).  Extraocular movements were full, visual field were full on confrontational test. Facial sensation and strength were normal.  Head turning and shoulder shrug  were normal and symmetric. Motor: The motor testing reveals 5 over 5 strength of all 4 extremities. Good symmetric motor tone is noted throughout.  Sensory: Sensory testing is intact to soft touch on all 4 extremities. No evidence of extinction is noted.  Coordination: Cerebellar testing reveals good finger-nose-finger and heel-to-shin bilaterally.  Gait and station: Gait is normal, but cautious. Reflexes: Deep tendon reflexes are symmetric and normal bilaterally.   DIAGNOSTIC DATA (LABS, IMAGING, TESTING) - I reviewed patient records, labs, notes, testing and imaging myself where available.  Lab Results  Component Value Date   WBC 10.7 11/20/2018   HGB 13.6 09/27/2019   HCT 40.0 09/27/2019   MCV 87 11/20/2018   PLT 232 11/20/2018      Component Value Date/Time   NA 141 09/27/2019 0933   K 3.5 09/27/2019 0933   CL 101 09/27/2019 0933   CO2 23 11/19/2018 2230   GLUCOSE 85 09/27/2019 0933   BUN 4 (L) 09/27/2019 0933   CREATININE 0.80 09/27/2019 0933   CALCIUM 8.6 (L) 11/19/2018 2230   PROT 8.1 02/02/2018 2006   ALBUMIN 4.4 02/02/2018 2006   AST 20 02/02/2018 2006   ALT 17 02/02/2018 2006   ALKPHOS 66 02/02/2018 2006   BILITOT 0.9 02/02/2018 2006   GFRNONAA >60 11/19/2018 2230   GFRAA >60  11/19/2018 2230   No results found for: CHOL, HDL, LDLCALC, LDLDIRECT, TRIG, CHOLHDL No results found for: HYQM5HHGBA1C No results found for: VITAMINB12 Lab Results  Component Value Date   TSH 1.770 11/20/2018    ASSESSMENT AND PLAN 34 y.o. year old female  has a past medical history of Depression, Fetal alcohol syndrome, History of chlamydia,  and Migraine. here with:  1.  Chronic migraine headaches, associated left hemiparesthesia 2.  Fetal alcohol syndrome 3.  Kerosene fume exposure October 2020 4.  Reported dizziness -CT head 11/11/19 showed no acute intracranial abnormalities -Dizziness, does have migraine component, triggered by smells, also frontal headache quality  -Add on Topamax 50 mg at bedtime if possibly migraine related -Given instructions to try Epley maneuver at home -Seeing ENT 12/12/2019 -Recommend picking up prednisone, Flonase, Zyrtec as prescribed by urgent care -Could not tolerate nortriptyline, never started Effexor -If ear pain, symptoms do not improve, needs to see PCP, will see back here in 3 months or sooner if needed  I spent 30 minutes of face-to-face and non-face-to-face time with patient.  This included previsit chart review, lab review, study review, order entry, electronic health record documentation, patient education.  Margie Ege, AGNP-C, DNP 11/28/2019, 11:11 AM [facility] [address] [place], [place] [address] [phone]

## 2019-11-28 NOTE — Patient Instructions (Addendum)
Start Topamax 50 mg at bedtime  Please pick up medications form pharmacy sent by Urgent Care  Try the Maneuver's I suggested below for dizziness Keep ENT appointment, see you primary doctor if ear pain doesn't improve See you back in 3 months  How to Perform the Epley Maneuver The Epley maneuver is an exercise that relieves symptoms of vertigo. Vertigo is the feeling that you or your surroundings are moving when they are not. When you feel vertigo, you may feel like the room is spinning and have trouble walking. Dizziness is a little different than vertigo. When you are dizzy, you may feel unsteady or light-headed. You can do this maneuver at home whenever you have symptoms of vertigo. You can do it up to 3 times a day until your symptoms go away. Even though the Epley maneuver may relieve your vertigo for a few weeks, it is possible that your symptoms will return. This maneuver relieves vertigo, but it does not relieve dizziness. What are the risks? If it is done correctly, the Epley maneuver is considered safe. Sometimes it can lead to dizziness or nausea that goes away after a short time. If you develop other symptoms, such as changes in vision, weakness, or numbness, stop doing the maneuver and call your health care provider. How to perform the Epley maneuver 1. Sit on the edge of a bed or table with your back straight and your legs extended or hanging over the edge of the bed or table. 2. Turn your head halfway toward the affected ear or side. 3. Lie backward quickly with your head turned until you are lying flat on your back. You may want to position a pillow under your shoulders. 4. Hold this position for 30 seconds. You may experience an attack of vertigo. This is normal. 5. Turn your head to the opposite direction until your unaffected ear is facing the floor. 6. Hold this position for 30 seconds. You may experience an attack of vertigo. This is normal. Hold this position until the vertigo  stops. 7. Turn your whole body to the same side as your head. Hold for another 30 seconds. 8. Sit back up. You can repeat this exercise up to 3 times a day. Follow these instructions at home:  After doing the Epley maneuver, you can return to your normal activities.  Ask your health care provider if there is anything you should do at home to prevent vertigo. He or she may recommend that you: ? Keep your head raised (elevated) with two or more pillows while you sleep. ? Do not sleep on the side of your affected ear. ? Get up slowly from bed. ? Avoid sudden movements during the day. ? Avoid extreme head movement, like looking up or bending over. Contact a health care provider if:  Your vertigo gets worse.  You have other symptoms, including: ? Nausea. ? Vomiting. ? Headache. Get help right away if:  You have vision changes.  You have a severe or worsening headache or neck pain.  You cannot stop vomiting.  You have new numbness or weakness in any part of your body. Summary  Vertigo is the feeling that you or your surroundings are moving when they are not.  The Epley maneuver is an exercise that relieves symptoms of vertigo.  If the Epley maneuver is done correctly, it is considered safe. You can do it up to 3 times a day. This information is not intended to replace advice given to you by  your health care provider. Make sure you discuss any questions you have with your health care provider. Document Revised: 12/23/2016 Document Reviewed: 12/01/2015 Elsevier Patient Education  2020 Elsevier Inc[employer]  Topiramate tablets What is this medicine? TOPIRAMATE (toe PYRE a mate) is used to treat seizures in adults or children with epilepsy. It is also used for the prevention of migraine headaches. This medicine may be used for other purposes; ask your health care provider or pharmacist if you have questions. COMMON BRAND NAME(S): Topamax, Topiragen What should I tell my health care  provider before I take this medicine? They need to know if you have any of these conditions:  bleeding disorders  kidney disease  lung or breathing disease, like asthma  suicidal thoughts, plans, or attempt; a previous suicide attempt by you or a family member  an unusual or allergic reaction to topiramate, other medicines, foods, dyes, or preservatives  pregnant or trying to get pregnant  breast-feeding How should I use this medicine? Take this medicine by mouth with a glass of water. Follow the directions on the prescription label. Do not cut, crush or chew this medicine. Swallow the tablets whole. You can take it with or without food. If it upsets your stomach, take it with food. Take your medicine at regular intervals. Do not take it more often than directed. Do not stop taking except on your doctor's advice. A special MedGuide will be given to you by the pharmacist with each prescription and refill. Be sure to read this information carefully each time. Talk to your pediatrician regarding the use of this medicine in children. While this drug may be prescribed for children as young as 67 years of age for selected conditions, precautions do apply. Overdosage: If you think you have taken too much of this medicine contact a poison control center or emergency room at once. NOTE: This medicine is only for you. Do not share this medicine with others. What if I miss a dose? If you miss a dose, take it as soon as you can. If your next dose is to be taken in less than 6 hours, then do not take the missed dose. Take the next dose at your regular time. Do not take double or extra doses. What may interact with this medicine? This medicine may interact with the following medications:  acetazolamide  alcohol  antihistamines for allergy, cough, and cold  aspirin and aspirin-like medicines  atropine  birth control pills  certain medicines for anxiety or sleep  certain medicines for bladder  problems like oxybutynin, tolterodine  certain medicines for depression like amitriptyline, fluoxetine, sertraline  certain medicines for seizures like carbamazepine, phenobarbital, phenytoin, primidone, valproic acid, zonisamide  certain medicines for stomach problems like dicyclomine, hyoscyamine  certain medicines for travel sickness like scopolamine  certain medicines for Parkinson's disease like benztropine, trihexyphenidyl  certain medicines that treat or prevent blood clots like warfarin, enoxaparin, dalteparin, apixaban, dabigatran, and rivaroxaban  digoxin  general anesthetics like halothane, isoflurane, methoxyflurane, propofol  hydrochlorothiazide  ipratropium  lithium  medicines that relax muscles for surgery  metformin  narcotic medicines for pain  NSAIDs, medicines for pain and inflammation, like ibuprofen or naproxen  phenothiazines like chlorpromazine, mesoridazine, prochlorperazine, thioridazine  pioglitazone This list may not describe all possible interactions. Give your health care provider a list of all the medicines, herbs, non-prescription drugs, or dietary supplements you use. Also tell them if you smoke, drink alcohol, or use illegal drugs. Some items may interact with your medicine.  What should I watch for while using this medicine? Visit your doctor or health care professional for regular checks on your progress. Tell your health care professional if your symptoms do not start to get better or if they get worse. Do not stop taking except on your health care professional's advice. You may develop a severe reaction. Your health care professional will tell you how much medicine to take. Wear a medical ID bracelet or chain. Carry a card that describes your disease and details of your medicine and dosage times. This medicine can reduce the response of your body to heat or cold. Dress warm in cold weather and stay hydrated in hot weather. If possible, avoid  extreme temperatures like saunas, hot tubs, very hot or cold showers, or activities that can cause dehydration such as vigorous exercise. Check with your health care professional if you have severe diarrhea, nausea, and vomiting, or if you sweat a lot. The loss of too much body fluid may make it dangerous for you to take this medicine. You may get drowsy or dizzy. Do not drive, use machinery, or do anything that needs mental alertness until you know how this medicine affects you. Do not stand up or sit up quickly, especially if you are an older patient. This reduces the risk of dizzy or fainting spells. Alcohol may interfere with the effect of this medicine. Avoid alcoholic drinks. Tell your health care professional right away if you have any change in your eyesight. Patients and their families should watch out for new or worsening depression or thoughts of suicide. Also watch out for sudden changes in feelings such as feeling anxious, agitated, panicky, irritable, hostile, aggressive, impulsive, severely restless, overly excited and hyperactive, or not being able to sleep. If this happens, especially at the beginning of treatment or after a change in dose, call your healthcare professional. This medicine may cause serious skin reactions. They can happen weeks to months after starting the medicine. Contact your health care provider right away if you notice fevers or flu-like symptoms with a rash. The rash may be red or purple and then turn into blisters or peeling of the skin. Or, you might notice a red rash with swelling of the face, lips or lymph nodes in your neck or under your arms. Birth control may not work properly while you are taking this medicine. Talk to your health care professional about using an extra method of birth control. Women should inform their health care professional if they wish to become pregnant or think they might be pregnant. There is a potential for serious side effects and harm  to an unborn child. Talk to your health care professional for more information. What side effects may I notice from receiving this medicine? Side effects that you should report to your doctor or health care professional as soon as possible:  allergic reactions like skin rash, itching or hives, swelling of the face, lips, or tongue  blood in the urine  changes in vision  confusion  loss of memory  pain in lower back or side  pain when urinating  redness, blistering, peeling or loosening of the skin, including inside the mouth  signs and symptoms of bleeding such as bloody or black, tarry stools; red or dark brown urine; spitting up blood or brown material that looks like coffee grounds; red spots on the skin; unusual bruising or bleeding from the eyes, gums, or nose  signs and symptoms of increased acid in the body like  breathing fast; fast heartbeat; headache; confusion; unusually weak or tired; nausea, vomiting  suicidal thoughts, mood changes  trouble speaking or understanding  unusual sweating  unusually weak or tired Side effects that usually do not require medical attention (report to your doctor or health care professional if they continue or are bothersome):  dizziness  drowsiness  fever  loss of appetite  nausea, vomiting  pain, tingling, numbness in the hands or feet  stomach pain  tiredness  upset stomach This list may not describe all possible side effects. Call your doctor for medical advice about side effects. You may report side effects to FDA at 1-800-FDA-1088. Where should I keep my medicine? Keep out of the reach of children. Store at room temperature between 15 and 30 degrees C (59 and 86 degrees F). Throw away any unused medicine after the expiration date. NOTE: This sheet is a summary. It may not cover all possible information. If you have questions about this medicine, talk to your doctor, pharmacist, or health care provider.  2020  Elsevier/Gold Standard (2018-08-09 15:07:20)

## 2019-12-16 NOTE — Progress Notes (Signed)
I have reviewed and agreed above plan. 

## 2020-03-03 ENCOUNTER — Encounter: Payer: Self-pay | Admitting: Neurology

## 2020-03-03 ENCOUNTER — Ambulatory Visit: Payer: Medicare HMO | Admitting: Neurology

## 2020-03-03 NOTE — Progress Notes (Deleted)
PATIENT: [name] [name] DOB: April 03, 1985  REASON FOR VISIT: follow up HISTORY FROM: patient  HISTORY OF PRESENT ILLNESS: Today 03/03/20  HISTORY [name] S Beattyis a 35 year old female, seen in refer by ENT Dr. Billy Fischer for evaluation of headaches, primary care physician is Dr. Midge Aver, [facility], initial evaluation was on January 26, 2017.  I reviewed and summarized the referring note, she has past medical history of depression, fetal alcohol syndrome.  She reported history of migraine headaches since 2011, her typical migraine are lateralized severe pounding headache with associated light noise, smell sensitivity, lasting for a few hours to 1 day.  She reported increased frequency of migraine headaches since 2018, can happen every other day, took sumatriptan 3-4 hours to help her headaches.  Trigger for her migraines are stress, bright light, strong smells,  Since 2018, she also noticed intermittent left facial, arm numbness during intense headaches,  We have personally reviewed CT head without contrast in October 2017 there was no significant abnormality.  UPDATE April 25 2017: MRI brain on April 23 2017 was normal.   She has been taking nortriptyline 10 mg up to 2 tablets every night as migraine prevention, Maxalt as needed was helpful, she rarely has migraine.  UPDATE Nov 20 2018: She presented to the emergency room November 19, 2018 for shortness of breath, there was a kerosene heater left inside the home of her mother, she came to the house and was overcome by fumes. She felt weakness, headache, shortness of breath, laboratory evaluation showed elevated carboxyhemoglobin 5.1, normal BMP, WBC was 15.9, negative HIV, RPR,  She now complains of holoacranial headache, generalized weakness, previous nortriptyline works well for her headache, Maxalt was helpful, but she has run out of prescription  Update May 23, 2019 SS: Here today for follow-up,  unaccompanied for migraine headache.  She has not been taking nortriptyline consistently, says when outside, it makes her skin burn.  Unclear, if it was beneficial for headache.  She has been taking Maxalt with good benefit. On average she is having 2-4 migraines a week.  Reportedly she reports increased stress, her mom passed away earlier this year, she has 4 kids, she does not work, feels overwhelmed.  She recently had a new Nexplanon inserted.  Goes to the health department, has been told she is overweight.  Laboratory evaluation showed elevated CPK 261, TSH was normal  Update November 28, 2019 SS: Went to urgent care 10/27 for dizziness, pain in the left ear, recommended Flonase, Zyrtec, given 5 days of prednisone.  CT head 11/11/19, showed no acute intracranial abnormalities  Report dizziness for the last month, reports migraines once a week, never started Effexor, worried about" health risk" side effects.  Takes Maxalt as needed.  With dizziness, does describe frontal head pain, dizziness comes and goes, has to wear "tissue" in her left ear to control the dizziness, worse when she looks up, triggered by smells.  Saw PCP, thinks she was given Zofran or meclizine with benefit.  Has some left ear pain, went to urgent care, never took prescribed medications.  Seeing ENT 12/12/19.  Under a lot of stress. Otherwise is baseline, no weakness.  Reported history of vertigo, went away.   Update March 03, 2020 SS:   REVIEW OF SYSTEMS: Out of a complete 14 system review of symptoms, the patient complains only of the following symptoms, and all other reviewed systems are negative.  Headache, dizziness  ALLERGIES: Allergies  Allergen Reactions  . Ibuprofen Swelling  Eyes swelled   . Septra [Sulfamethoxazole-Trimethoprim] Hives    legs    HOME MEDICATIONS: Outpatient Medications Prior to Visit  Medication Sig Dispense Refill  . benzonatate (TESSALON) 100 MG capsule Take 1 capsule (100 mg total)  by mouth every 8 (eight) hours. 21 capsule 0  . cetirizine (ZYRTEC) 10 MG tablet Take 1 tablet (10 mg total) by mouth daily. 30 tablet 0  . cyclobenzaprine (FLEXERIL) 10 MG tablet Take 0.5-1 tablets (5-10 mg total) by mouth 3 (three) times daily as needed for muscle spasms. 20 tablet 0  . etonogestrel (NEXPLANON) 68 MG IMPL implant 1 each by Subdermal route once.     . fluticasone (FLONASE) 50 MCG/ACT nasal spray Place 1 spray into both nostrils daily. 16 g 2  . ondansetron (ZOFRAN ODT) 4 MG disintegrating tablet Take 1 tablet (4 mg total) by mouth every 8 (eight) hours as needed for nausea or vomiting. 10 tablet 0  . promethazine-dextromethorphan (PROMETHAZINE-DM) 6.25-15 MG/5ML syrup Take 5 mLs by mouth 4 (four) times daily as needed for cough. 118 mL 0  . rizatriptan (MAXALT-MLT) 10 MG disintegrating tablet Take 1 tab at onset of migraine.  May repeat in 2 hrs, if needed.  Max dose: 2 tabs/day. This is a 30 day prescription. 12 tablet 5  . topiramate (TOPAMAX) 50 MG tablet Take 1 tablet (50 mg total) by mouth at bedtime. 30 tablet 5  . traMADol (ULTRAM) 50 MG tablet Take 1 tablet (50 mg total) by mouth every 12 (twelve) hours as needed for severe pain. 12 tablet 0   Facility-Administered Medications Prior to Visit  Medication Dose Route Frequency Provider Last Rate Last Admin  . rho(d) immune globulin (RHIG/Rhophylac) injection 300 mcg  300 mcg Intramuscular Once Kathreen Cosier, MD        PAST MEDICAL HISTORY: Past Medical History:  Diagnosis Date  . Depression    post partum  . 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 History of chlamydia   . Migraine     PAST SURGICAL HISTORY: Past Surgical History:  Procedure Laterality Date  . APPENDECTOMY    . EYE SURGERY    . TYMPANOSTOMY TUBE PLACEMENT      FAMILY HISTORY: Family History  Problem Relation Age of Onset  . Alcohol abuse Mother   . Seizures Father   . Alcohol abuse Father     SOCIAL  HISTORY: Social History   Socioeconomic History  . Marital status: Single    Spouse name: Not on file  . Number of children: 4  . Years of education: 1 year college  . Highest education level: Not on file  Occupational History  . Occupation: Unemployed  Tobacco Use  . Smoking status: Former Smoker    Types: Cigarettes  . Smokeless tobacco: Never Used  Vaping Use  . Vaping Use: Never used  Substance and Sexual Activity  . Alcohol use: Yes    Comment: Rare use  . Drug use: No  . Sexual activity: Yes    Birth control/protection: None  Other Topics Concern  . Not on file  Social History Narrative   Lives at home with her children.   Right-handed.   1 to 1.5 cups caffeine per day.   Social Determinants of Health   Financial Resource Strain: Not on file  Food Insecurity: No Food Insecurity  . Worried About Programme researcher, broadcasting/film/video in the Last Year: Never true  . Ran Out of Food in the  Last Year: Never true  Transportation Needs: No Transportation Needs  . Lack of Transportation (Medical): No  . Lack of Transportation (Non-Medical): No  Physical Activity: Not on file  Stress: Not on file  Social Connections: Not on file  Intimate Partner Violence: Not on file   PHYSICAL EXAM  There were no vitals filed for this visit. There is no height or weight on file to calculate BMI.  Generalized: Well developed, in no acute distress   Neurological examination  Mentation: Alert oriented to time, place, history taking. Follows all commands speech and language fluent Cranial nerve II-XII: Pupils were equal round reactive to light, but left eye is smaller than right (reported baseline).  Extraocular movements were full, visual field were full on confrontational test. Facial sensation and strength were normal.  Head turning and shoulder shrug  were normal and symmetric. Motor: The motor testing reveals 5 over 5 strength of all 4 extremities. Good symmetric motor tone is noted throughout.   Sensory: Sensory testing is intact to soft touch on all 4 extremities. No evidence of extinction is noted.  Coordination: Cerebellar testing reveals good finger-nose-finger and heel-to-shin bilaterally.  Gait and station: Gait is normal, but cautious. Reflexes: Deep tendon reflexes are symmetric and normal bilaterally.   DIAGNOSTIC DATA (LABS, IMAGING, TESTING) - I reviewed patient records, labs, notes, testing and imaging myself where available.  Lab Results  Component Value Date   WBC 10.7 11/20/2018   HGB 13.6 09/27/2019   HCT 40.0 09/27/2019   MCV 87 11/20/2018   PLT 232 11/20/2018      Component Value Date/Time   NA 141 09/27/2019 0933   K 3.5 09/27/2019 0933   CL 101 09/27/2019 0933   CO2 23 11/19/2018 2230   GLUCOSE 85 09/27/2019 0933   BUN 4 (L) 09/27/2019 0933   CREATININE 0.80 09/27/2019 0933   CALCIUM 8.6 (L) 11/19/2018 2230   PROT 8.1 02/02/2018 2006   ALBUMIN 4.4 02/02/2018 2006   AST 20 02/02/2018 2006   ALT 17 02/02/2018 2006   ALKPHOS 66 02/02/2018 2006   BILITOT 0.9 02/02/2018 2006   GFRNONAA >60 11/19/2018 2230   GFRAA >60 11/19/2018 2230   No results found for: CHOL, HDL, LDLCALC, LDLDIRECT, TRIG, CHOLHDL No results found for: ELFY1O No results found for: VITAMINB12 Lab Results  Component Value Date   TSH 1.770 11/20/2018    ASSESSMENT AND PLAN 35 y.o. year old female  has a past medical history of Depression, Fetal alcohol syndrome, History of chlamydia, and Migraine. here with:  1.  Chronic migraine headaches, associated left hemiparesthesia 2.  Fetal alcohol syndrome 3.  Kerosene fume exposure October 2020 4.  Reported dizziness -CT head 11/11/19 showed no acute intracranial abnormalities -Dizziness, does have migraine component, triggered by smells, also frontal headache quality  -Add on Topamax 50 mg at bedtime if possibly migraine related -Given instructions to try Epley maneuver at home -Seeing ENT 12/12/2019 -Recommend picking up  prednisone, Flonase, Zyrtec as prescribed by urgent care -Could not tolerate nortriptyline, never started Effexor -If ear pain, symptoms do not improve, needs to see PCP, will see back here in 3 months or sooner if needed  I spent 30 minutes of face-to-face and non-face-to-face time with patient.  This included previsit chart review, lab review, study review, order entry, electronic health record documentation, patient education.  Margie Ege, AGNP-C, DNP 03/03/2020, 5:48 AM [facility] [address], [address] [place], [place] [address] [phone]

## 2020-03-25 ENCOUNTER — Other Ambulatory Visit: Payer: Self-pay | Admitting: Family

## 2020-03-25 DIAGNOSIS — N6452 Nipple discharge: Secondary | ICD-10-CM

## 2020-05-07 ENCOUNTER — Other Ambulatory Visit: Payer: Medicare HMO

## 2020-06-09 ENCOUNTER — Ambulatory Visit: Payer: Medicare HMO | Admitting: Neurology

## 2020-06-11 ENCOUNTER — Other Ambulatory Visit: Payer: Medicare HMO

## 2020-07-14 ENCOUNTER — Encounter: Payer: Self-pay | Admitting: Neurology

## 2020-07-14 ENCOUNTER — Ambulatory Visit (INDEPENDENT_AMBULATORY_CARE_PROVIDER_SITE_OTHER): Payer: Medicare HMO | Admitting: Neurology

## 2020-07-14 VITALS — BP 104/71 | HR 90 | Ht 64.0 in | Wt 166.6 lb

## 2020-07-14 DIAGNOSIS — G43009 Migraine without aura, not intractable, without status migrainosus: Secondary | ICD-10-CM

## 2020-07-14 MED ORDER — RIZATRIPTAN BENZOATE 10 MG PO TBDP: ORAL_TABLET | ORAL | 11 refills | Status: DC

## 2020-07-14 MED ORDER — TOPIRAMATE 50 MG PO TABS: 50.0000 mg | ORAL_TABLET | Freq: Every day | ORAL | 3 refills | Status: DC

## 2020-07-14 NOTE — Patient Instructions (Signed)
Restart Topamax 50 mg at bedtime for headache prevention  Takes Maxalt as needed for headache  Continue to see your primary care doctor See you back in 1 year

## 2020-07-14 NOTE — Progress Notes (Signed)
PATIENT: [name] [name] DOB: 05/16/1985  REASON FOR VISIT: follow up HISTORY FROM: patient  HISTORY OF PRESENT ILLNESS: Today 07/14/20  HISTORY [name] [name] is a 35 year old female, seen in refer by ENT Dr. Billy Fischer for evaluation of headaches, primary care physician is Dr. Midge Aver, [facility], initial evaluation was on January 26, 2017.   I reviewed and summarized the referring note, she has past medical history of depression, fetal alcohol syndrome.   She reported history of migraine headaches since 2011, her typical migraine are lateralized severe pounding headache with associated light noise, smell sensitivity, lasting for a few hours to 1 day.   She reported increased frequency of migraine headaches since 2018, can happen every other day, took sumatriptan 3-4 hours to help her headaches.   Trigger for her migraines are stress, bright light, strong smells,   Since 2018, she also noticed intermittent left facial, arm numbness during intense headaches,   We have personally reviewed CT head without contrast in October 2017 there was no significant abnormality.   UPDATE April 25 2017: MRI brain on April 23 2017 was normal.    She has been taking nortriptyline 10 mg up to 2 tablets every night as migraine prevention, Maxalt as needed was helpful, she rarely has migraine.   UPDATE Nov 20 2018: She presented to the emergency room November 19, 2018 for shortness of breath, there was a kerosene heater left inside the home of her mother, she came to the house and was overcome by fumes.  She felt weakness, headache, shortness of breath, laboratory evaluation showed elevated carboxyhemoglobin 5.1, normal BMP, WBC was 15.9, negative HIV, RPR,   She now complains of holoacranial headache, generalized weakness, previous nortriptyline works well for her headache, Maxalt was helpful, but she has run out of prescription   Update May 23, 2019 SS: Here today for follow-up,  unaccompanied for migraine headache.  She has not been taking nortriptyline consistently, says when outside, it makes her skin burn.  Unclear, if it was beneficial for headache.  She has been taking Maxalt with good benefit. On average she is having 2-4 migraines a week.  Reportedly she reports increased stress, her mom passed away earlier this year, she has 4 kids, she does not work, feels overwhelmed.  She recently had a new Nexplanon inserted.  Goes to the health department, has been told she is overweight.   Laboratory evaluation showed elevated CPK 261, TSH was normal  Update November 28, 2019 SS: Went to urgent care 10/27 for dizziness, pain in the left ear, recommended Flonase, Zyrtec, given 5 days of prednisone.  CT head 11/11/19, showed no acute intracranial abnormalities  Report dizziness for the last month, reports migraines once a week, never started Effexor, worried about" health risk" side effects.  Takes Maxalt as needed.  With dizziness, does describe frontal head pain, dizziness comes and goes, has to wear "tissue" in her left ear to control the dizziness, worse when she looks up, triggered by smells.  Saw PCP, thinks she was given Zofran or meclizine with benefit.  Has some left ear pain, went to urgent care, never took prescribed medications.  Seeing ENT 12/12/19.  Under a lot of stress. Otherwise is baseline, no weakness.  Reported history of vertigo, went away.   Update July 14, 2020 SS: Has 4 kids, her mother passed away. Reports can't lay on her right side, makes her dizzy. Migraines are triggered by coffee, onions, certain foods. Reports 4 migraines  weekly, related to her kids being out for summer. Has Nexplanon implant. 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 Maxalt works well.   REVIEW OF SYSTEMS: Out of a complete 14 system review of symptoms, the patient complains only of the following symptoms, and all other reviewed systems are  negative.  See HPI  ALLERGIES: Allergies  Allergen Reactions   Ibuprofen Swelling    Eyes swelled    Septra [Sulfamethoxazole-Trimethoprim] Hives    legs    HOME MEDICATIONS: Outpatient Medications Prior to Visit  Medication Sig Dispense Refill   cyclobenzaprine (FLEXERIL) 10 MG tablet Take 0.5-1 tablets (5-10 mg total) by mouth 3 (three) times daily as needed for muscle spasms. 20 tablet 0   etonogestrel (NEXPLANON) 68 MG IMPL implant 1 each by Subdermal route once.      fluticasone (FLONASE) 50 MCG/ACT nasal spray Place 1 spray into both nostrils daily. 16 g 2   traMADol (ULTRAM) 50 MG tablet Take 1 tablet (50 mg total) by mouth every 12 (twelve) hours as needed for severe pain. 12 tablet 0   rizatriptan (MAXALT-MLT) 10 MG disintegrating tablet Take 1 tab at onset of migraine.  May repeat in 2 hrs, if needed.  Max dose: 2 tabs/day. This is a 30 day prescription. 12 tablet 5   topiramate (TOPAMAX) 50 MG tablet Take 1 tablet (50 mg total) by mouth at bedtime. 30 tablet 5   benzonatate (TESSALON) 100 MG capsule Take 1 capsule (100 mg total) by mouth every 8 (eight) hours. 21 capsule 0   cetirizine (ZYRTEC) 10 MG tablet Take 1 tablet (10 mg total) by mouth daily. 30 tablet 0   ondansetron (ZOFRAN ODT) 4 MG disintegrating tablet Take 1 tablet (4 mg total) by mouth every 8 (eight) hours as needed for nausea or vomiting. 10 tablet 0   promethazine-dextromethorphan (PROMETHAZINE-DM) 6.25-15 MG/5ML syrup Take 5 mLs by mouth 4 (four) times daily as needed for cough. 118 mL 0   Facility-Administered Medications Prior to Visit  Medication Dose Route Frequency Provider Last Rate Last Admin   rho(d) immune globulin (RHIG/Rhophylac) injection 300 mcg  300 mcg Intramuscular Once Kathreen Cosier, MD        PAST MEDICAL HISTORY: 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   History of chlamydia     Migraine     PAST SURGICAL HISTORY: Past Surgical History:  Procedure Laterality Date   APPENDECTOMY     EYE SURGERY     TYMPANOSTOMY TUBE PLACEMENT      FAMILY HISTORY: Family History  Problem Relation Age of Onset   Alcohol abuse Mother    Seizures Father    Alcohol abuse Father     SOCIAL HISTORY: Social History   Socioeconomic History   Marital status: Single    Spouse name: Not on file   Number of children: 4   Years of education: 1 year college   Highest education level: Not on file  Occupational History   Occupation: Unemployed  Tobacco Use   Smoking status: Former    Pack years: 0.00    Types: Cigarettes   Smokeless tobacco: Never  Vaping Use   Vaping Use: Never used  Substance and Sexual Activity   Alcohol use: Yes    Comment: Rare use   Drug use: No   Sexual activity: Yes    Birth  control/protection: None  Other Topics Concern   Not on file  Social History Narrative   Lives at home with her children.   Right-handed.   1 to 1.5 cups caffeine per day.   Social Determinants of Health   Financial Resource Strain: Not on file  Food Insecurity: Not on file  Transportation Needs: Not on file  Physical Activity: Not on file  Stress: Not on file  Social Connections: Not on file  Intimate Partner Violence: Not on file   PHYSICAL EXAM  Vitals:   07/14/20 1019  BP: 104/71  Pulse: 90  Weight: 166 lb 9.6 oz (75.6 kg)  Height: 5\' 4"  (1.626 m)    Body mass index is 28.6 kg/m.  Generalized: Well developed, in no acute distress, more put together today than normal, thoughts seem clearer, more focused Neurological examination  Mentation: Alert oriented to time, place, history taking. Follows all commands speech and language fluent Cranial nerve II-XII: Pupils were equal round reactive to light, but left eye is smaller than right (reported baseline).  Extraocular movements were full, visual field were full on confrontational test. Facial sensation and  strength were normal.  Head turning and shoulder shrug  were normal and symmetric. Motor: The motor testing reveals 5 over 5 strength of all 4 extremities. Good symmetric motor tone is noted throughout.  Sensory: Sensory testing is intact to soft touch on all 4 extremities. No evidence of extinction is noted.  Coordination: Cerebellar testing reveals good finger-nose-finger and heel-to-shin bilaterally.  Gait and station: Gait is normal, but cautious. Reflexes: Deep tendon reflexes are symmetric and normal bilaterally.   DIAGNOSTIC DATA (LABS, IMAGING, TESTING) - I reviewed patient records, labs, notes, testing and imaging myself where available.  Lab Results  Component Value Date   WBC 10.7 11/20/2018   HGB 13.6 09/27/2019   HCT 40.0 09/27/2019   MCV 87 11/20/2018   PLT 232 11/20/2018      Component Value Date/Time   NA 141 09/27/2019 0933   K 3.5 09/27/2019 0933   CL 101 09/27/2019 0933   CO2 23 11/19/2018 2230   GLUCOSE 85 09/27/2019 0933   BUN 4 (L) 09/27/2019 0933   CREATININE 0.80 09/27/2019 0933   CALCIUM 8.6 (L) 11/19/2018 2230   PROT 8.1 02/02/2018 2006   ALBUMIN 4.4 02/02/2018 2006   AST 20 02/02/2018 2006   ALT 17 02/02/2018 2006   ALKPHOS 66 02/02/2018 2006   BILITOT 0.9 02/02/2018 2006   GFRNONAA >60 11/19/2018 2230   GFRAA >60 11/19/2018 2230   No results found for: CHOL, HDL, LDLCALC, LDLDIRECT, TRIG, CHOLHDL No results found for: 11/21/2018 No results found for: VITAMINB12 Lab Results  Component Value Date   TSH 1.770 11/20/2018    ASSESSMENT AND PLAN 35 y.o. year old female  has a past medical history of Depression, Fetal alcohol syndrome, History of chlamydia, and Migraine. here with:  1.  Chronic migraine headaches, associated left hemiparesthesia 2.  Fetal alcohol syndrome 3.  Kerosene fume exposure October 2020 4.  Reported dizziness -Start Topamax 50 mg at bedtime for migraine prevention -Continue Maxalt as needed for acute headache -Has known  migraine triggers, increased stress -Could not tolerate nortriptyline, never started Effexor -CT head 11/11/19 showed no acute intracranial abnormalities -Continue follow-up with PCP, follow-up at our office in 1 year or sooner if needed  11/13/19, DNP 07/14/2020, 10:39 AM [facility] [address] [place], [place] [phone]

## 2020-07-17 ENCOUNTER — Other Ambulatory Visit: Payer: Medicare HMO

## 2020-08-18 ENCOUNTER — Other Ambulatory Visit: Payer: Self-pay

## 2020-08-18 ENCOUNTER — Emergency Department (HOSPITAL_BASED_OUTPATIENT_CLINIC_OR_DEPARTMENT_OTHER): Payer: Medicare HMO | Admitting: Radiology

## 2020-08-18 ENCOUNTER — Encounter (HOSPITAL_BASED_OUTPATIENT_CLINIC_OR_DEPARTMENT_OTHER): Payer: Self-pay | Admitting: *Deleted

## 2020-08-18 ENCOUNTER — Emergency Department (HOSPITAL_BASED_OUTPATIENT_CLINIC_OR_DEPARTMENT_OTHER)
Admission: EM | Admit: 2020-08-18 | Discharge: 2020-08-18 | Disposition: A | Discharge status: Home / Self Care | Payer: Medicare HMO | Attending: Emergency Medicine | Admitting: Emergency Medicine

## 2020-08-18 DIAGNOSIS — Z87891 Personal history of nicotine dependence: Secondary | ICD-10-CM | POA: Insufficient documentation

## 2020-08-18 DIAGNOSIS — M62838 Other muscle spasm: Secondary | ICD-10-CM

## 2020-08-18 DIAGNOSIS — M25511 Pain in right shoulder: Secondary | ICD-10-CM | POA: Insufficient documentation

## 2020-08-18 DIAGNOSIS — M62830 Muscle spasm of back: Secondary | ICD-10-CM | POA: Diagnosis not present

## 2020-08-18 DIAGNOSIS — M546 Pain in thoracic spine: Secondary | ICD-10-CM | POA: Diagnosis present

## 2020-08-18 LAB — PREGNANCY, URINE: Preg Test, Ur: NEGATIVE

## 2020-08-18 MED ORDER — CYCLOBENZAPRINE HCL 10 MG PO TABS: 10.0000 mg | ORAL_TABLET | Freq: Two times a day (BID) | ORAL | 0 refills | Status: DC | PRN

## 2020-08-18 MED ORDER — ACETAMINOPHEN 325 MG PO TABS
650.0000 mg | ORAL_TABLET | Freq: Once | ORAL | Status: AC
  Administered 2020-08-18: 650 mg via ORAL
  Filled 2020-08-18: qty 2

## 2020-08-18 MED ORDER — METHYLPREDNISOLONE 4 MG PO TBPK: ORAL_TABLET | ORAL | 0 refills | Status: DC

## 2020-08-18 NOTE — ED Provider Notes (Signed)
[facility] EMERGENCY DEPT Provider Note   CSN: [id number] Arrival date & time: 08/18/20  1651     History Chief Complaint  Patient presents with   Back Pain    [name] [name] is a 35 y.o. female.  Patient with right upper back, right shoulder pain since mowing the lawn several days ago.  Feels like she is having a spasm.  The history is provided by the patient.  Back Pain Pain severity:  Mild Onset quality:  Gradual Timing:  Intermittent Progression:  Waxing and waning Chronicity:  New Relieved by:  Nothing Exacerbated by: Movement. Associated symptoms: no abdominal pain, no chest pain, no fever, no numbness and no weakness   Shoulder Pain Location:  Shoulder Shoulder location:  R shoulder Associated symptoms: back pain   Associated symptoms: no fever and no neck pain       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   History of chlamydia    Migraine     Patient Active Problem List   Diagnosis Date Noted   Migraine 11/28/2019   Dizziness 11/28/2019   Weakness 11/20/2018   Fetal alcohol syndrome 11/20/2018   Paresthesia 01/26/2017   Active labor 11/04/2013   Normal delivery at term 11/04/2013   Normal delivery 06/04/2011    Past Surgical History:  Procedure Laterality Date   APPENDECTOMY     EYE SURGERY     TYMPANOSTOMY TUBE PLACEMENT       OB History     Gravida  4   Para  4   Term  4   Preterm  0   AB  0   Living  4      SAB  0   IAB  0   Ectopic  0   Multiple  0   Live Births  4           Family History  Problem Relation Age of Onset   Alcohol abuse Mother    Seizures Father    Alcohol abuse Father     Social History   Tobacco Use   Smoking status: Former    Types: Cigarettes   Smokeless tobacco: Never  Vaping Use   Vaping Use: Never used  Substance Use Topics   Alcohol use: Yes    Comment: Rare use    Drug use: No    Home Medications Prior to Admission medications   Medication Sig Start Date End Date Taking? Authorizing Provider  cyclobenzaprine (FLEXERIL) 10 MG tablet Take 1 tablet (10 mg total) by mouth 2 (two) times daily as needed for muscle spasms. 08/18/20  Yes Carinne Brandenburger, DO  methylPREDNISolone (MEDROL DOSEPAK) 4 MG TBPK tablet Follow package insert 08/18/20  Yes Cleola Perryman, DO  etonogestrel (NEXPLANON) 68 MG IMPL implant 1 each by Subdermal route once.     [provider]  fluticasone (FLONASE) 50 MCG/ACT nasal spray Place 1 spray into both nostrils daily. 07/03/18   Dahlia Byes A, NP  rizatriptan (MAXALT-MLT) 10 MG disintegrating tablet Take 1 tab at onset of migraine.  May repeat in 2 hrs, if needed.  Max dose: 2 tabs/day. This is a 30 day prescription. 07/14/20   Glean Salvo, NP    Allergies    Ibuprofen and Septra [sulfamethoxazole-trimethoprim]  Review of Systems   Review of Systems  Constitutional:  Negative for diaphoresis and fever.  Respiratory:  Negative for shortness of breath.   Cardiovascular:  Negative for chest pain.  Gastrointestinal:  Negative for abdominal pain.  Musculoskeletal:  Positive for arthralgias and back pain. Negative for neck pain.  Skin:  Negative for color change and rash.  Neurological:  Negative for weakness and numbness.   Physical Exam Updated Vital Signs BP 130/88 (BP Location: Right Arm)   Pulse (!) 57   Temp 98.7 F (37.1 C)   Resp 20   Ht 5\' 3"  (1.6 m)   Wt 75.8 kg   SpO2 100%   BMI 29.58 kg/m   Physical Exam Constitutional:      General: She is not in acute distress.    Appearance: She is not ill-appearing.  HENT:     Head: Normocephalic and atraumatic.  Musculoskeletal:        General: Tenderness present. Normal range of motion.     Cervical back: Normal range of motion. No tenderness.     Comments: Tenderness to the paraspinal cervical muscles on the right into the right trapezius, no midline  spinal tenderness, pain, pain with range of motion of the right shoulder most in the right trapezius area  Skin:    General: Skin is warm.     Capillary Refill: Capillary refill takes less than 2 seconds.  Neurological:     General: No focal deficit present.     Mental Status: She is alert.     Sensory: No sensory deficit.     Motor: No weakness.     Comments: 5+ out of 5 strength in bilateral upper extremities, normal sensation bilateral upper extremities    ED Results / Procedures / Treatments   Labs (all labs ordered are listed, but only abnormal results are displayed) Labs Reviewed  PREGNANCY, URINE    EKG EKG Interpretation  Date/Time:  Tuesday August 18 2020 17:42:28 EDT Ventricular Rate:  86 PR Interval:  146 QRS Duration: 76 QT Interval:  352 QTC Calculation: 421 R Axis:   38 Text Interpretation: Normal sinus rhythm Normal ECG Confirmed by [id number] ([id number]) on 08/18/2020 7:08:20 PM  Radiology DG Chest 2 View  Result Date: 08/18/2020 CLINICAL DATA:  Right upper back pain without known injury. EXAM: CHEST - 2 VIEW COMPARISON:  September 27, 2019. FINDINGS: The heart size and mediastinal contours are within normal limits. Both lungs are clear. The visualized skeletal structures are unremarkable. IMPRESSION: No active cardiopulmonary disease. Electronically Signed   By: September 29, 2019 M.D.   On: 08/18/2020 19:23    Procedures Procedures   Medications Ordered in ED Medications  acetaminophen (TYLENOL) tablet 650 mg (650 mg Oral Given 08/18/20 1740)    ED Course  I have reviewed the triage vital signs and the nursing notes.  Pertinent labs & imaging results that were available during my care of the patient were reviewed by me and considered in my medical decision making (see chart for details).    MDM Rules/Calculators/A&P                           [name] [name] is here with right upper back, right shoulder pain after mowing the lawn several days ago.   Overall appears consistent with a muscle spasm.  No midline spinal pain.  Neurovascular neuromuscular intact in her upper extremities.  No concern for spinal cord injury or process.  Will prescribe Flexeril, Medrol Dosepak, Tylenol.  She is allergic to ibuprofen.  Patient overall appears  well.  Chest x-ray shows no pneumothorax.  Will refer her to sports medicine.  Discharged in good condition.  This chart was dictated using voice recognition software.  Despite best efforts to proofread,  errors can occur which can change the documentation meaning.   Final Clinical Impression(s) / ED Diagnoses Final diagnoses:  Muscle spasm    Rx / DC Orders ED Discharge Orders          Ordered    methylPREDNISolone (MEDROL DOSEPAK) 4 MG TBPK tablet        08/18/20 1937    cyclobenzaprine (FLEXERIL) 10 MG tablet  2 times daily PRN        08/18/20 1937             Virgina Norfolk, DO 08/18/20 1939

## 2020-08-18 NOTE — Discharge Instructions (Addendum)
Take medications as prescribed.  Recommend 1000 mg of Tylenol every 6 hours as needed for pain as well.

## 2020-08-18 NOTE — ED Triage Notes (Addendum)
Right upper back pain (right shoulder blade) radiating to right side of her neck  for 4 days and is getting worst today.  Denies any injuries.

## 2020-08-20 ENCOUNTER — Other Ambulatory Visit: Payer: Self-pay

## 2020-08-20 ENCOUNTER — Ambulatory Visit (HOSPITAL_COMMUNITY)
Admission: EM | Admit: 2020-08-20 | Discharge: 2020-08-20 | Disposition: A | Discharge status: Home / Self Care | Payer: Medicare HMO

## 2020-08-20 ENCOUNTER — Encounter (HOSPITAL_COMMUNITY): Payer: Self-pay | Admitting: *Deleted

## 2020-08-20 DIAGNOSIS — S29012A Strain of muscle and tendon of back wall of thorax, initial encounter: Secondary | ICD-10-CM

## 2020-08-20 DIAGNOSIS — M546 Pain in thoracic spine: Secondary | ICD-10-CM | POA: Diagnosis not present

## 2020-08-20 DIAGNOSIS — T148XXA Other injury of unspecified body region, initial encounter: Secondary | ICD-10-CM | POA: Diagnosis not present

## 2020-08-20 NOTE — ED Provider Notes (Signed)
[facility]    CSN: [id number] Arrival date & time: 08/20/20  1157      History   Chief Complaint Chief Complaint  Patient presents with   Back Pain    HPI JULIENE KIRSH is a 35 y.o. female.   Patient presents to the urgent care with 6-day history of right upper back pain that started after patient was doing yard work.  Patient was seen at ED on 7/26 and was given a prescription for methylprednisolone and cyclobenzaprine.  Patient has been taking methylprednisone for approximately 2 days but has not taken cyclobenzaprine because she was not sure if she could take these 2 medications together.  Pain is worsened with movement and in the sitting position.  Denies any numbness or tingling.  Denies any chest pain or shortness of breath.  Chest x-ray was performed at previous ED visit that was negative as well.   Back Pain  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   History of chlamydia    Migraine     Patient Active Problem List   Diagnosis Date Noted   Migraine 11/28/2019   Dizziness 11/28/2019   Weakness 11/20/2018   Fetal alcohol syndrome 11/20/2018   Paresthesia 01/26/2017   Active labor 11/04/2013   Normal delivery at term 11/04/2013   Normal delivery 06/04/2011    Past Surgical History:  Procedure Laterality Date   APPENDECTOMY     EYE SURGERY     TYMPANOSTOMY TUBE PLACEMENT     WISDOM TOOTH EXTRACTION      OB History     Gravida  4   Para  4   Term  4   Preterm  0   AB  0   Living  4      SAB  0   IAB  0   Ectopic  0   Multiple  0   Live Births  4            Home Medications    Prior to Admission medications   Medication Sig Start Date End Date Taking? Authorizing Provider  etonogestrel (NEXPLANON) 68 MG IMPL implant 1 each by Subdermal route once.    Yes [provider]  fluticasone (FLONASE) 50 MCG/ACT nasal  spray Place 1 spray into both nostrils daily. 07/03/18  Yes Bast, Traci A, NP  methylPREDNISolone (MEDROL DOSEPAK) 4 MG TBPK tablet Follow package insert 08/18/20  Yes Curatolo, Adam, DO  rizatriptan (MAXALT-MLT) 10 MG disintegrating tablet Take 1 tab at onset of migraine.  May repeat in 2 hrs, if needed.  Max dose: 2 tabs/day. This is a 30 day prescription. 07/14/20  Yes Glean Salvo, NP  SIMVASTATIN PO Take by mouth.   Yes [provider]  cyclobenzaprine (FLEXERIL) 10 MG tablet Take 1 tablet (10 mg total) by mouth 2 (two) times daily as needed for muscle spasms. 08/18/20   Virgina Norfolk, DO    Family History Family History  Problem Relation Age of Onset   Alcohol abuse Mother    Seizures Father    Alcohol abuse Father     Social History Social History   Tobacco Use   Smoking status: Former    Types: Cigarettes   Smokeless tobacco: Never  Vaping Use   Vaping Use: Never used  Substance Use Topics   Alcohol use: Yes    Comment: occasionally  Drug use: No     Allergies   Ibuprofen and Septra [sulfamethoxazole-trimethoprim]   Review of Systems Review of Systems Per HPI  Physical Exam Triage Vital Signs ED Triage Vitals [08/20/20 1012]  Enc Vitals Group     BP 106/72     Pulse Rate 77     Resp 20     Temp 98.5 F (36.9 C)     Temp Source Oral     SpO2 98 %     Weight      Height      Head Circumference      Peak Flow      Pain Score 8     Pain Loc      Pain Edu?      Excl. in GC?    No data found.  Updated Vital Signs BP 106/72   Pulse 77   Temp 98.5 F (36.9 C) (Oral)   Resp 20   SpO2 98%   Visual Acuity Right Eye Distance:   Left Eye Distance:   Bilateral Distance:    Right Eye Near:   Left Eye Near:    Bilateral Near:     Physical Exam Constitutional:      Appearance: Normal appearance.  HENT:     Head: Normocephalic and atraumatic.  Eyes:     Extraocular Movements: Extraocular movements intact.     Conjunctiva/sclera:  Conjunctivae normal.  Cardiovascular:     Rate and Rhythm: Normal rate and regular rhythm.     Pulses: Normal pulses.     Heart sounds: Normal heart sounds.  Pulmonary:     Effort: Pulmonary effort is normal.     Breath sounds: Normal breath sounds.  Musculoskeletal:     Cervical back: Tenderness present.     Thoracic back: Tenderness present.     Lumbar back: Normal.     Comments: Tenderness to the paraspinal cervical muscles on the right into the right trapezius, no midline spinal tenderness.  Pain does occur with any type of movement per patient.  Neurological:     General: No focal deficit present.     Mental Status: She is alert and oriented to person, place, and time. Mental status is at baseline.  Psychiatric:        Mood and Affect: Mood normal.        Behavior: Behavior normal.        Thought Content: Thought content normal.        Judgment: Judgment normal.     UC Treatments / Results  Labs (all labs ordered are listed, but only abnormal results are displayed) Labs Reviewed - No data to display  EKG   Radiology DG Chest 2 View  Result Date: 08/18/2020 CLINICAL DATA:  Right upper back pain without known injury. EXAM: CHEST - 2 VIEW COMPARISON:  September 27, 2019. FINDINGS: The heart size and mediastinal contours are within normal limits. Both lungs are clear. The visualized skeletal structures are unremarkable. IMPRESSION: No active cardiopulmonary disease. Electronically Signed   By: Lupita Raider M.D.   On: 08/18/2020 19:23    Procedures Procedures (including critical care time)  Medications Ordered in UC Medications - No data to display  Initial Impression / Assessment and Plan / UC Course  I have reviewed the triage vital signs and the nursing notes.  Pertinent labs & imaging results that were available during my care of the patient were reviewed by me and considered in my medical decision making (see  chart for details).     Patient's clinical symptoms  and physical exam still consistent with muscle strain as previously noted in ED.  Patient advised to continue steroid pack as well as taking cyclobenzaprine as needed for muscle spasms and/or pain.  Patient is allergic to ibuprofen, so patient was advised to take Tylenol as needed for pain.  Patient was provided with contact information for orthopedic sports medicine for further evaluation and management if pain does not resolve in the next 3 to 7 days.  Patient to alternate ice and heat application to affected area of pain as well.Discussed strict return precautions. Patient verbalized understanding and is agreeable with plan.  Final Clinical Impressions(s) / UC Diagnoses   Final diagnoses:  Strain of thoracic back region  Acute right-sided thoracic back pain  Muscle strain     Discharge Instructions      Please complete steroid course and take Flexeril as needed for pain and muscle spasms.  You may also take Tylenol as needed for pain.  Alternate ice and heat application to affected area of pain.  Please call orthopedic sports medicine for further evaluation and management if pain does not resolve in the next 3 to 7 days.     ED Prescriptions   None    PDMP not reviewed this encounter.   Lance Muss, FNP 08/20/20 [phone]

## 2020-08-20 NOTE — Discharge Instructions (Addendum)
Please complete steroid course and take Flexeril as needed for pain and muscle spasms.  You may also take Tylenol as needed for pain.  Alternate ice and heat application to affected area of pain.  Please call orthopedic sports medicine for further evaluation and management if pain does not resolve in the next 3 to 7 days.

## 2020-08-20 NOTE — ED Triage Notes (Signed)
Pt c/o gradual onset right thoracic back pain onset approx 2 wks ago.  Was seen in ED 7/26 - given Rx for methyprednisolone and cyclobenazaprine; pt states has not been taking cyclobenazprine - "I didn't know you could take it with the other med".  Pain worse w/ deep breathing, movement, sitting.  Denies parasthesias.

## 2020-08-25 ENCOUNTER — Ambulatory Visit
Admission: RE | Admit: 2020-08-25 | Discharge: 2020-08-25 | Disposition: A | Discharge status: Home / Self Care | Payer: Medicare HMO | Source: Ambulatory Visit | Attending: Family | Admitting: Family

## 2020-08-25 ENCOUNTER — Ambulatory Visit: Payer: Medicare HMO

## 2020-08-25 ENCOUNTER — Other Ambulatory Visit: Payer: Self-pay

## 2020-08-25 DIAGNOSIS — N6452 Nipple discharge: Secondary | ICD-10-CM

## 2020-10-07 NOTE — Progress Notes (Signed)
Chart reviewed, agree above plan ?

## 2021-01-07 ENCOUNTER — Encounter (HOSPITAL_COMMUNITY): Payer: Self-pay

## 2021-01-07 ENCOUNTER — Ambulatory Visit (HOSPITAL_COMMUNITY)
Admission: EM | Admit: 2021-01-07 | Discharge: 2021-01-07 | Disposition: A | Discharge status: Home / Self Care | Payer: Medicare HMO | Attending: Emergency Medicine | Admitting: Emergency Medicine

## 2021-01-07 ENCOUNTER — Other Ambulatory Visit: Payer: Self-pay

## 2021-01-07 DIAGNOSIS — R051 Acute cough: Secondary | ICD-10-CM

## 2021-01-07 MED ORDER — ALBUTEROL SULFATE HFA 108 (90 BASE) MCG/ACT IN AERS: 2.0000 | INHALATION_SPRAY | RESPIRATORY_TRACT | 0 refills | Status: AC | PRN

## 2021-01-07 MED ORDER — BENZONATATE 100 MG PO CAPS: 100.0000 mg | ORAL_CAPSULE | Freq: Three times a day (TID) | ORAL | 0 refills | Status: DC

## 2021-01-07 MED ORDER — PREDNISONE 20 MG PO TABS: 40.0000 mg | ORAL_TABLET | Freq: Every day | ORAL | 0 refills | Status: DC

## 2021-01-07 MED ORDER — PROMETHAZINE-DM 6.25-15 MG/5ML PO SYRP: 5.0000 mL | ORAL_SOLUTION | Freq: Four times a day (QID) | ORAL | 0 refills | Status: DC | PRN

## 2021-01-07 NOTE — Discharge Instructions (Signed)
Take prednisone every morning with food for the next 5 days to help calm any irritation or inflammation to the upper airways  Use Tessalon pill every 8 hours as needed to help calm coughing   May use cough syrup every 6 hours to help calm coughing, please be mindful this medication may make you drowsy, if this occurs just use at bedtime  May use inhaler every 4-6 hours for shortness of breath  Maintaining adequate hydration may help to thin secretions and soothe the respiratory mucosa   Warm Liquids- Ingestion of warm liquids may have a soothing effect on the respiratory mucosa, increase the flow of nasal mucus, and loosen respiratory secretions, making them easier to remove  May try honey (2.5 to 5 mL [0.5 to 1 teaspoon]) can be given straight or diluted in liquid (juice). Corn syrup may be substituted if honey is not available.

## 2021-01-07 NOTE — ED Triage Notes (Signed)
Pt reports cough x 2 weeks. OTC meds give so relief.

## 2021-01-07 NOTE — ED Provider Notes (Signed)
[facility]    CSN: [id number] Arrival date & time: 01/07/21  0911      History   Chief Complaint Chief Complaint  Patient presents with   Cough    HPI [name] [name] is a 35 y.o. female.   Patient presents with productive cough for 3 weeks. Interfering with sleep.  Intermittent shortness of breath after coughing fits.  Exposure to viruses prior to the cough beginning. Has attempted use of otc medications with no relief.  Denies fever, chills, body aches, nasal congestion, rhinorrhea, sore throat, wheezing.  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   History of chlamydia    Migraine     Patient Active Problem List   Diagnosis Date Noted   Migraine 11/28/2019   Dizziness 11/28/2019   Weakness 11/20/2018   Fetal alcohol syndrome 11/20/2018   Paresthesia 01/26/2017   Active labor 11/04/2013   Normal delivery at term 11/04/2013   Normal delivery 06/04/2011    Past Surgical History:  Procedure Laterality Date   APPENDECTOMY     EYE SURGERY     TYMPANOSTOMY TUBE PLACEMENT     WISDOM TOOTH EXTRACTION      OB History     Gravida  4   Para  4   Term  4   Preterm  0   AB  0   Living  4      SAB  0   IAB  0   Ectopic  0   Multiple  0   Live Births  4            Home Medications    Prior to Admission medications   Medication Sig Start Date End Date Taking? Authorizing Provider  cyclobenzaprine (FLEXERIL) 10 MG tablet Take 1 tablet (10 mg total) by mouth 2 (two) times daily as needed for muscle spasms. 08/18/20   Curatolo, Adam, DO  etonogestrel (NEXPLANON) 68 MG IMPL implant 1 each by Subdermal route once.     [provider]  fluticasone (FLONASE) 50 MCG/ACT nasal spray Place 1 spray into both nostrils daily. 07/03/18   Dahlia Byes A, NP  methylPREDNISolone (MEDROL DOSEPAK) 4 MG TBPK tablet Follow package insert 08/18/20   Curatolo,  Adam, DO  rizatriptan (MAXALT-MLT) 10 MG disintegrating tablet Take 1 tab at onset of migraine.  May repeat in 2 hrs, if needed.  Max dose: 2 tabs/day. This is a 30 day prescription. 07/14/20   Glean Salvo, NP  SIMVASTATIN PO Take by mouth.    [provider]    Family History Family History  Problem Relation Age of Onset   Alcohol abuse Mother    Seizures Father    Alcohol abuse Father     Social History Social History   Tobacco Use   Smoking status: Former    Types: Cigarettes   Smokeless tobacco: Never  Vaping Use   Vaping Use: Never used  Substance Use Topics   Alcohol use: Yes    Comment: occasionally   Drug use: No     Allergies   Ibuprofen and Septra [sulfamethoxazole-trimethoprim]   Review of Systems Review of Systems  Constitutional: Negative.   HENT: Negative.    Respiratory:  Positive for cough. Negative for apnea, choking, chest tightness, shortness of breath, wheezing and stridor.   Cardiovascular: Negative.   Gastrointestinal: Negative.   Skin: Negative.  Neurological: Negative.     Physical Exam Triage Vital Signs ED Triage Vitals  Enc Vitals Group     BP 01/07/21 1017 130/79     Pulse Rate 01/07/21 1017 71     Resp 01/07/21 1017 18     Temp 01/07/21 1017 98.4 F (36.9 C)     Temp Source 01/07/21 1017 Oral     SpO2 01/07/21 1017 97 %     Weight --      Height --      Head Circumference --      Peak Flow --      Pain Score 01/07/21 1019 0     Pain Loc --      Pain Edu? --      Excl. in GC? --    No data found.  Updated Vital Signs BP 130/79 (BP Location: Right Arm)    Pulse 71    Temp 98.4 F (36.9 C) (Oral)    Resp 18    LMP  (Within Weeks) Comment: 2 weeks   SpO2 97%   Visual Acuity Right Eye Distance:   Left Eye Distance:   Bilateral Distance:    Right Eye Near:   Left Eye Near:    Bilateral Near:     Physical Exam Constitutional:      Appearance: Normal appearance. She is normal weight.  HENT:     Right  Ear: Ear canal and external ear normal. A middle ear effusion is present.     Left Ear: Ear canal and external ear normal. A middle ear effusion is present.     Nose: Nose normal.     Mouth/Throat:     Mouth: Mucous membranes are moist.     Pharynx: Oropharynx is clear.  Eyes:     Extraocular Movements: Extraocular movements intact.  Cardiovascular:     Rate and Rhythm: Normal rate and regular rhythm.     Pulses: Normal pulses.     Heart sounds: Normal heart sounds.  Pulmonary:     Effort: Pulmonary effort is normal.     Breath sounds: Normal breath sounds.     Comments: Dry cough witnessed  Musculoskeletal:     Cervical back: Normal range of motion and neck supple.  Neurological:     Mental Status: She is alert and oriented to person, place, and time. Mental status is at baseline.  Psychiatric:        Mood and Affect: Mood normal.        Behavior: Behavior normal.     UC Treatments / Results  Labs (all labs ordered are listed, but only abnormal results are displayed) Labs Reviewed - No data to display  EKG   Radiology No results found.  Procedures Procedures (including critical care time)  Medications Ordered in UC Medications - No data to display  Initial Impression / Assessment and Plan / UC Course  I have reviewed the triage vital signs and the nursing notes.  Pertinent labs & imaging results that were available during my care of the patient were reviewed by me and considered in my medical decision making (see chart for details).  Acute cough  1.  Prednisone 40 mg daily for 5 days 2.  Albuterol inhaler 108 mcg 2 puffs every 4 hours as needed 3.  Tessalon 100 mg 3 times daily as needed 4.  Promethazine DM 6.25-15 mg / 5 mL every 6 hours as needed 5.  Urgent care follow-up as needed Final Clinical Impressions(s) /  UC Diagnoses   Final diagnoses:  None   Discharge Instructions   None    ED Prescriptions   None    PDMP not reviewed this  encounter.   Valinda Hoar, NP 01/07/21 1035

## 2021-01-26 ENCOUNTER — Emergency Department (HOSPITAL_COMMUNITY): Payer: Medicare HMO

## 2021-01-26 ENCOUNTER — Encounter (HOSPITAL_COMMUNITY): Payer: Self-pay | Admitting: *Deleted

## 2021-01-26 ENCOUNTER — Ambulatory Visit (INDEPENDENT_AMBULATORY_CARE_PROVIDER_SITE_OTHER)
Admission: EM | Admit: 2021-01-26 | Discharge: 2021-01-26 | Disposition: A | Discharge status: Home / Self Care | Payer: Medicare HMO | Source: Home / Self Care

## 2021-01-26 ENCOUNTER — Encounter (HOSPITAL_COMMUNITY): Payer: Self-pay

## 2021-01-26 ENCOUNTER — Other Ambulatory Visit: Payer: Self-pay

## 2021-01-26 ENCOUNTER — Emergency Department (HOSPITAL_COMMUNITY)
Admission: EM | Admit: 2021-01-26 | Discharge: 2021-01-26 | Discharge status: Against Medical Advice | Payer: Medicare HMO | Attending: Emergency Medicine | Admitting: Emergency Medicine

## 2021-01-26 DIAGNOSIS — R1011 Right upper quadrant pain: Secondary | ICD-10-CM | POA: Insufficient documentation

## 2021-01-26 DIAGNOSIS — R1114 Bilious vomiting: Secondary | ICD-10-CM | POA: Insufficient documentation

## 2021-01-26 DIAGNOSIS — Z3202 Encounter for pregnancy test, result negative: Secondary | ICD-10-CM | POA: Insufficient documentation

## 2021-01-26 DIAGNOSIS — J069 Acute upper respiratory infection, unspecified: Secondary | ICD-10-CM | POA: Insufficient documentation

## 2021-01-26 DIAGNOSIS — Z5321 Procedure and treatment not carried out due to patient leaving prior to being seen by health care provider: Secondary | ICD-10-CM | POA: Diagnosis not present

## 2021-01-26 DIAGNOSIS — Z20822 Contact with and (suspected) exposure to covid-19: Secondary | ICD-10-CM | POA: Insufficient documentation

## 2021-01-26 DIAGNOSIS — R112 Nausea with vomiting, unspecified: Secondary | ICD-10-CM | POA: Insufficient documentation

## 2021-01-26 DIAGNOSIS — R1032 Left lower quadrant pain: Secondary | ICD-10-CM | POA: Diagnosis present

## 2021-01-26 LAB — CBC WITH DIFFERENTIAL/PLATELET
Abs Immature Granulocytes: 0.07 10*3/uL (ref 0.00–0.07)
Basophils Absolute: 0 10*3/uL (ref 0.0–0.1)
Basophils Relative: 0 %
Eosinophils Absolute: 0.1 10*3/uL (ref 0.0–0.5)
Eosinophils Relative: 1 %
HCT: 40.3 % (ref 36.0–46.0)
Hemoglobin: 13.1 g/dL (ref 12.0–15.0)
Immature Granulocytes: 0 %
Lymphocytes Relative: 9 %
Lymphs Abs: 1.5 10*3/uL (ref 0.7–4.0)
MCH: 28.8 pg (ref 26.0–34.0)
MCHC: 32.5 g/dL (ref 30.0–36.0)
MCV: 88.6 fL (ref 80.0–100.0)
Monocytes Absolute: 0.8 10*3/uL (ref 0.1–1.0)
Monocytes Relative: 5 %
Neutro Abs: 14.7 10*3/uL — ABNORMAL HIGH (ref 1.7–7.7)
Neutrophils Relative %: 85 %
Platelets: 358 10*3/uL (ref 150–400)
RBC: 4.55 MIL/uL (ref 3.87–5.11)
RDW: 13.2 % (ref 11.5–15.5)
WBC: 17.1 10*3/uL — ABNORMAL HIGH (ref 4.0–10.5)
nRBC: 0 % (ref 0.0–0.2)

## 2021-01-26 LAB — URINALYSIS, ROUTINE W REFLEX MICROSCOPIC
Bilirubin Urine: NEGATIVE
Glucose, UA: NEGATIVE mg/dL
Hgb urine dipstick: NEGATIVE
Ketones, ur: NEGATIVE mg/dL
Leukocytes,Ua: NEGATIVE
Nitrite: NEGATIVE
Protein, ur: NEGATIVE mg/dL
Specific Gravity, Urine: 1.025 (ref 1.005–1.030)
pH: 6 (ref 5.0–8.0)

## 2021-01-26 LAB — COMPREHENSIVE METABOLIC PANEL
ALT: 18 U/L (ref 0–44)
AST: 18 U/L (ref 15–41)
Albumin: 3.7 g/dL (ref 3.5–5.0)
Alkaline Phosphatase: 65 U/L (ref 38–126)
Anion gap: 6 (ref 5–15)
BUN: 9 mg/dL (ref 6–20)
CO2: 24 mmol/L (ref 22–32)
Calcium: 8.6 mg/dL — ABNORMAL LOW (ref 8.9–10.3)
Chloride: 105 mmol/L (ref 98–111)
Creatinine, Ser: 0.87 mg/dL (ref 0.44–1.00)
GFR, Estimated: >60 mL/min (ref >60)
Glucose, Bld: 103 mg/dL — ABNORMAL HIGH (ref 70–99)
Potassium: 3.6 mmol/L (ref 3.5–5.1)
Sodium: 135 mmol/L (ref 135–145)
Total Bilirubin: 0.7 mg/dL (ref 0.3–1.2)
Total Protein: 7.3 g/dL (ref 6.5–8.1)

## 2021-01-26 LAB — POC INFLUENZA A AND B ANTIGEN (URGENT CARE ONLY)
INFLUENZA A ANTIGEN, POC: NEGATIVE
INFLUENZA B ANTIGEN, POC: NEGATIVE

## 2021-01-26 LAB — LIPASE, BLOOD: Lipase: 26 U/L (ref 11–51)

## 2021-01-26 LAB — POC URINE PREG, ED: Preg Test, Ur: NEGATIVE

## 2021-01-26 MED ORDER — ONDANSETRON 8 MG PO TBDP: 8.0000 mg | ORAL_TABLET | Freq: Three times a day (TID) | ORAL | 0 refills | Status: DC | PRN

## 2021-01-26 NOTE — ED Triage Notes (Signed)
Patient complains of abdominal pain with nausea and vomiting. Reports that both she and her daughter have same after eating a pizza. Alert and oriented

## 2021-01-26 NOTE — ED Provider Notes (Signed)
[facility]    CSN: [id number] Arrival date & time: 01/26/21  1749      History   Chief Complaint Chief Complaint  Patient presents with   Cough   Abdominal Pain   Flank Pain   Emesis    HPI [name] [name] is a 36 y.o. female presenting with R sided abd pain. LMP started today. Medical history chlamydia.  Denies pulm ds. Initially presented to the emergency department but left without being seen due to the wait time.  Describes 24 hours of symptoms.  Abdominal pain, nausea, vomiting x20 episodes of bilious vomiting today  Abdominal pain is right-sided and radiates to the back, intermittent. She has had pain similar to this in the past, could be related to period.  Cough is nonproductive. No fever/chills, congestion.  No prior history abdominal surgery.  States her daughter has similar symptoms. Denies vaginal symptoms.   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   History of chlamydia    Migraine     Patient Active Problem List   Diagnosis Date Noted   Migraine 11/28/2019   Dizziness 11/28/2019   Weakness 11/20/2018   Fetal alcohol syndrome 11/20/2018   Paresthesia 01/26/2017   Active labor 11/04/2013   Normal delivery at term 11/04/2013   Normal delivery 06/04/2011    Past Surgical History:  Procedure Laterality Date   APPENDECTOMY     EYE SURGERY     TYMPANOSTOMY TUBE PLACEMENT     WISDOM TOOTH EXTRACTION      OB History     Gravida  4   Para  4   Term  4   Preterm  0   AB  0   Living  4      SAB  0   IAB  0   Ectopic  0   Multiple  0   Live Births  4            Home Medications    Prior to Admission medications   Medication Sig Start Date End Date Taking? Authorizing Provider  ondansetron (ZOFRAN-ODT) 8 MG disintegrating tablet Take 1 tablet (8 mg total) by mouth every 8 (eight) hours as needed for nausea or vomiting.  01/26/21  Yes Rhys MartiniGraham, Tava Peery E, PA-C  albuterol (VENTOLIN HFA) 108 (90 Base) MCG/ACT inhaler Inhale 2 puffs into the lungs every 4 (four) hours as needed for wheezing or shortness of breath. 01/07/21   White, Elita BooneAdrienne R, NP  benzonatate (TESSALON) 100 MG capsule Take 1 capsule (100 mg total) by mouth every 8 (eight) hours. 01/07/21   White, Elita BooneAdrienne R, NP  cyclobenzaprine (FLEXERIL) 10 MG tablet Take 1 tablet (10 mg total) by mouth 2 (two) times daily as needed for muscle spasms. 08/18/20   Curatolo, Adam, DO  etonogestrel (NEXPLANON) 68 MG IMPL implant 1 each by Subdermal route once.     [provider]  fluticasone (FLONASE) 50 MCG/ACT nasal spray Place 1 spray into both nostrils daily. 07/03/18   Dahlia ByesBast, Traci A, NP  methylPREDNISolone (MEDROL DOSEPAK) 4 MG TBPK tablet Follow package insert 08/18/20   Curatolo, Adam, DO  predniSONE (DELTASONE) 20 MG tablet Take 2 tablets (40 mg total) by mouth daily. 01/07/21   Valinda HoarWhite, Adrienne R, NP  promethazine-dextromethorphan (PROMETHAZINE-DM) 6.25-15 MG/5ML syrup Take 5 mLs by mouth 4 (four) times daily as needed for cough. 01/07/21  White, Adrienne R, NP  rizatriptan (MAXALT-MLT) 10 MG disintegrating tablet Take 1 tab at onset of migraine.  May repeat in 2 hrs, if needed.  Max dose: 2 tabs/day. This is a 30 day prescription. 07/14/20   Glean Salvo, NP  SIMVASTATIN PO Take by mouth.    [provider]    Family History Family History  Problem Relation Age of Onset   Alcohol abuse Mother    Seizures Father    Alcohol abuse Father     Social History Social History   Tobacco Use   Smoking status: Former    Types: Cigarettes   Smokeless tobacco: Never  Vaping Use   Vaping Use: Never used  Substance Use Topics   Alcohol use: Yes    Comment: occasionally   Drug use: No     Allergies   Ibuprofen and Septra [sulfamethoxazole-trimethoprim]   Review of Systems Review of Systems  Constitutional:  Negative for appetite change,  chills, diaphoresis, fever and unexpected weight change.  HENT:  Negative for congestion, ear pain, sinus pressure, sinus pain, sneezing, sore throat and trouble swallowing.   Respiratory:  Negative for cough, chest tightness and shortness of breath.   Cardiovascular:  Negative for chest pain.  Gastrointestinal:  Positive for abdominal pain, nausea and vomiting. Negative for abdominal distention, anal bleeding, blood in stool, constipation, diarrhea and rectal pain.  Genitourinary:  Negative for dysuria, flank pain, frequency and urgency.  Musculoskeletal:  Negative for back pain and myalgias.  Neurological:  Negative for dizziness, light-headedness and headaches.  All other systems reviewed and are negative.   Physical Exam Triage Vital Signs ED Triage Vitals  Enc Vitals Group     BP      Pulse      Resp      Temp      Temp src      SpO2      Weight      Height      Head Circumference      Peak Flow      Pain Score      Pain Loc      Pain Edu?      Excl. in GC?    No data found.  Updated Vital Signs BP 116/71    Pulse (!) 107    Temp 100.2 F (37.9 C)    Resp (!) 21    LMP 12/27/2020    SpO2 95%   Visual Acuity Right Eye Distance:   Left Eye Distance:   Bilateral Distance:    Right Eye Near:   Left Eye Near:    Bilateral Near:     Physical Exam Vitals reviewed.  Constitutional:      General: She is not in acute distress.    Appearance: Normal appearance. She is not ill-appearing.  HENT:     Head: Normocephalic and atraumatic.     Mouth/Throat:     Mouth: Mucous membranes are moist.     Comments: Moist mucous membranes Eyes:     Extraocular Movements: Extraocular movements intact.     Pupils: Pupils are equal, round, and reactive to light.  Cardiovascular:     Rate and Rhythm: Regular rhythm. Tachycardia present.     Heart sounds: Normal heart sounds.  Pulmonary:     Effort: Pulmonary effort is normal.     Breath sounds: Normal breath sounds. No  wheezing, rhonchi or rales.  Abdominal:     General: Bowel sounds are increased. There  is no distension.     Palpations: Abdomen is soft. There is no mass.     Tenderness: There is generalized abdominal tenderness. There is no right CVA tenderness, left CVA tenderness, guarding or rebound.     Comments: Generalized TTP with increased BS throughout. No guarding, rebound, hernia, mass. Comfortable throughout exam.  Skin:    General: Skin is warm.     Capillary Refill: Capillary refill takes less than 2 seconds.     Comments: Good skin turgor  Neurological:     General: No focal deficit present.     Mental Status: She is alert and oriented to person, place, and time.  Psychiatric:        Mood and Affect: Mood normal.        Behavior: Behavior normal.     UC Treatments / Results  Labs (all labs ordered are listed, but only abnormal results are displayed) Labs Reviewed  SARS CORONAVIRUS 2 (TAT 6-24 HRS)  POC URINE PREG, ED  POC INFLUENZA A AND B ANTIGEN (URGENT CARE ONLY)    EKG   Radiology US ABDOMEN LIMITED RUQ (LIVER/GB)  Result Date: 01/26/2021 CLINICAL DATA:  Right upper quadrant abdominal pain and vomiting beginning yesterday. EXAM: ULTRASOUND ABDOMEN LIMITED RIGHT UPPER QUADRANT COMPARISON:  None. FINDINGS: Gallbladder: No gallstones or wall thickening visualized. No sonographic Murphy sign noted by sonographer. Common bile duct: Diameter: 3 mm Liver: No focal lesion identified. Within normal limits in parenchymal echogenicity. Portal vein is patent on color Doppler imaging with normal direction of blood flow towards the liver. Other: None. IMPRESSION: Normal right upper quadrant ultrasound. Electronically Signed   By: Paulina Fusi M.D.   On: 01/26/2021 14:24    Procedures Procedures (including critical care time)  Medications Ordered in UC Medications - No data to display  Initial Impression / Assessment and Plan / UC Course  I have reviewed the triage vital signs and the  nursing notes.  Pertinent labs & imaging results that were available during my care of the patient were reviewed by me and considered in my medical decision making (see chart for details).     This patient is a very pleasant 36 y.o. year old female presenting with viral URI. Borderline tachy.  U-preg negative. Labwork as performed by ED all fairly normal except for elevated neutrophils.  Rapid influenza negative. Covid PCR sent. Not a candidate for antiviral given age, mild symptoms and no pulm ds.  Zofran ODT sent.  ED return precautions discussed. Patient verbalizes understanding and agreement.   Coding Level 4 for review of past notes/labs, order and interpretation of labs today, and prescription drug management   Final Clinical Impressions(s) / UC Diagnoses   Final diagnoses:  Viral URI with cough  Nausea and vomiting, unspecified vomiting type  Negative pregnancy test     Discharge Instructions      -Your flu test was negative. We're also screening for covid. -Your pregnancy test was negative. -Take the Zofran (ondansetron) up to 3 times daily for nausea and vomiting. Dissolve one pill under your tongue or between your teeth and your cheek. -Drink plenty of fluids -With a virus, you're typically contagious for 5-7 days, or as long as you're having fevers.       ED Prescriptions     Medication Sig Dispense Auth. Provider   ondansetron (ZOFRAN-ODT) 8 MG disintegrating tablet Take 1 tablet (8 mg total) by mouth every 8 (eight) hours as needed for nausea or vomiting. 20 tablet Cheree Ditto,  Lyman SpellerLaura E, PA-C      PDMP not reviewed this encounter.   Rhys MartiniGraham, Yahsir Wickens E, PA-C 01/26/21 2018

## 2021-01-26 NOTE — ED Notes (Signed)
Pt is at [facility].

## 2021-01-26 NOTE — ED Triage Notes (Signed)
Pt BIB [facility] c/o abdominal pain and N/V since last night when eating some bad pizza.

## 2021-01-26 NOTE — Discharge Instructions (Addendum)
-  Your flu test was negative. We're also screening for covid. -Your pregnancy test was negative. -Take the Zofran (ondansetron) up to 3 times daily for nausea and vomiting. Dissolve one pill under your tongue or between your teeth and your cheek. -Drink plenty of fluids -With a virus, you're typically contagious for 5-7 days, or as long as you're having fevers.

## 2021-01-26 NOTE — ED Provider Notes (Signed)
Emergency Medicine Provider Triage Evaluation Note  [name] [name] , a 36 y.o. female  was evaluated in triage.  Pt complains of abdominal pain nausea and vomiting.  Symptoms began last night.  She and her daughter both had similar symptoms.  She complains of pain on the right side of her abdomen radiating to her back.  She is had similar pain in the past but never this bad.  No fevers chills or history of abdominal surgery  Review of Systems  Positive: Abdominal pain nausea and vomiting Negative: Fever  Physical Exam  BP 111/74 (BP Location: Left Arm)    Pulse 90    Temp 98.6 F (37 C) (Oral)    Resp 17    SpO2 100%  Gen:   Awake, no distress   Resp:  Normal effort  MSK:   Moves extremities without difficulty  Other:  Ttp ruq  Medical Decision Making  Medically screening exam initiated at 1:15 PM.  Appropriate orders placed.  DALEY MOORADIAN was informed that the remainder of the evaluation will be completed by another provider, this initial triage assessment does not replace that evaluation, and the importance of remaining in the ED until their evaluation is complete.  Work-up initiated   Arthor Captain, PA-C 01/26/21 1317    Edwin Dada P, DO 01/27/21 1640

## 2021-01-26 NOTE — ED Triage Notes (Signed)
Pt reports cough ,vomiting and ABD pain since yesterday.

## 2021-01-27 LAB — SARS CORONAVIRUS 2 (TAT 6-24 HRS): SARS Coronavirus 2: NEGATIVE

## 2021-05-28 ENCOUNTER — Inpatient Hospital Stay (HOSPITAL_COMMUNITY)
Admission: AD | Admit: 2021-05-28 | Discharge: 2021-05-28 | Disposition: A | Discharge status: Home / Self Care | Payer: Medicare HMO | Attending: Obstetrics & Gynecology | Admitting: Obstetrics & Gynecology

## 2021-05-28 ENCOUNTER — Inpatient Hospital Stay (HOSPITAL_COMMUNITY): Payer: Medicare HMO

## 2021-05-28 ENCOUNTER — Encounter (HOSPITAL_COMMUNITY): Payer: Self-pay | Admitting: Obstetrics & Gynecology

## 2021-05-28 DIAGNOSIS — Z6791 Unspecified blood type, Rh negative: Secondary | ICD-10-CM | POA: Diagnosis not present

## 2021-05-28 DIAGNOSIS — O26891 Other specified pregnancy related conditions, first trimester: Secondary | ICD-10-CM | POA: Insufficient documentation

## 2021-05-28 DIAGNOSIS — O208 Other hemorrhage in early pregnancy: Secondary | ICD-10-CM | POA: Insufficient documentation

## 2021-05-28 DIAGNOSIS — M545 Low back pain, unspecified: Secondary | ICD-10-CM | POA: Diagnosis not present

## 2021-05-28 DIAGNOSIS — R109 Unspecified abdominal pain: Secondary | ICD-10-CM | POA: Insufficient documentation

## 2021-05-28 DIAGNOSIS — Z3A01 Less than 8 weeks gestation of pregnancy: Secondary | ICD-10-CM | POA: Insufficient documentation

## 2021-05-28 DIAGNOSIS — Z3490 Encounter for supervision of normal pregnancy, unspecified, unspecified trimester: Secondary | ICD-10-CM

## 2021-05-28 LAB — URINALYSIS, ROUTINE W REFLEX MICROSCOPIC
Bilirubin Urine: NEGATIVE
Glucose, UA: NEGATIVE mg/dL
Hgb urine dipstick: NEGATIVE
Ketones, ur: NEGATIVE mg/dL
Leukocytes,Ua: NEGATIVE
Nitrite: NEGATIVE
Protein, ur: NEGATIVE mg/dL
Specific Gravity, Urine: 1.016 (ref 1.005–1.030)
pH: 6 (ref 5.0–8.0)

## 2021-05-28 LAB — CBC
HCT: 37.1 % (ref 36.0–46.0)
Hemoglobin: 12.2 g/dL (ref 12.0–15.0)
MCH: 28.6 pg (ref 26.0–34.0)
MCHC: 32.9 g/dL (ref 30.0–36.0)
MCV: 87.1 fL (ref 80.0–100.0)
Platelets: 366 10*3/uL (ref 150–400)
RBC: 4.26 MIL/uL (ref 3.87–5.11)
RDW: 13.1 % (ref 11.5–15.5)
WBC: 10.6 10*3/uL — ABNORMAL HIGH (ref 4.0–10.5)
nRBC: 0 % (ref 0.0–0.2)

## 2021-05-28 LAB — WET PREP, GENITAL
Clue Cells Wet Prep HPF POC: NONE SEEN
Sperm: NONE SEEN
Trich, Wet Prep: NONE SEEN
WBC, Wet Prep HPF POC: <10 (ref <10)
Yeast Wet Prep HPF POC: NONE SEEN

## 2021-05-28 LAB — HCG, QUANTITATIVE, PREGNANCY: hCG, Beta Chain, Quant, S: 2628 m[IU]/mL — ABNORMAL HIGH (ref <5)

## 2021-05-28 LAB — POCT PREGNANCY, URINE: Preg Test, Ur: POSITIVE — AB

## 2021-05-28 NOTE — MAU Note (Signed)
.  [name] [name] is a 36 y.o. at Unknown here in [facility]U reporting: sharp, lower abdominal pain 6/10 that started 3 days ago. She also reports a tingling back pain 5/10 that started around three weeks ago. Denies VB or LOF. Reports getting a positive pregnancy test at home 3 weeks ago.  ?LMP: 04/18/21 ?Onset of complaint: 3 days ?Pain score: 6/10 ?Vitals:  ? 05/28/21 0908  ?BP: 107/68  ?Pulse: 84  ?Resp: 16  ?Temp: 99 ?F (37.2 ?C)  ?SpO2: 99%  ?   ?Lab orders placed from triage:  UA, Pregnancy Test ? ?

## 2021-05-28 NOTE — Discharge Instructions (Signed)

## 2021-05-28 NOTE — MAU Provider Note (Signed)
?History  ?  ? ?CSN: [id number] ? ?Arrival date and time: 05/28/21 0850 ? ?Event Date/Time  ?First Provider Initiated Contact with Patient 05/28/21 1038   ?  ?Chief Complaint  ?Patient presents with  ? Abdominal Pain  ? ?HPI ?[name] [name] is a 36 y.o. [id number] at [redacted]w[redacted]d by LMP who presents to MAU with chief complaint of abdominal pain and low back pain. These are new problems, onset three days ago. Pain score 6/10. She has not taken medication for this complaint. She denies aggravating or alleviating factors. She denies vaginal bleeding, dysuria, fever or recent illness. ?  ? ?OB History   ? ? Gravida  ?5  ? Para  ?4  ? Term  ?4  ? Preterm  ?0  ? AB  ?0  ? Living  ?4  ?  ? ? SAB  ?0  ? IAB  ?0  ? Ectopic  ?0  ? Multiple  ?0  ? Live Births  ?4  ?   ?  ?  ? ? ?Past Medical History:  ?Diagnosis Date  ? Depression   ? post partum  ? Fetal alcohol syndrome   ? high functioning mental retardation  ? History of chlamydia   ? Migraine   ? ? ?Past Surgical History:  ?Procedure Laterality Date  ? APPENDECTOMY    ? EYE SURGERY    ? TYMPANOSTOMY TUBE PLACEMENT    ? WISDOM TOOTH EXTRACTION    ? ? ?Family History  ?Problem Relation Age of Onset  ? Alcohol abuse Mother   ? Seizures Father   ? Alcohol abuse Father   ? ? ?Social History  ? ?Tobacco Use  ? Smoking status: Former  ?  Types: Cigarettes  ? Smokeless tobacco: Never  ?Vaping Use  ? Vaping Use: Never used  ?Substance Use Topics  ? Alcohol use: Not Currently  ?  Comment: occasionally  ? Drug use: Not Currently  ? ? ?Allergies:  ?Allergies  ?Allergen Reactions  ? Ibuprofen Swelling  ?  Eyes swelled   ? Septra [Sulfamethoxazole-Trimethoprim] Hives  ?  legs  ? ? ?Medications Prior to Admission  ?Medication Sig Dispense Refill Last Dose  ? Prenatal Vit-Fe Fumarate-FA (PRENATAL MULTIVITAMIN) TABS tablet Take 1 tablet by mouth daily at 12 noon.   05/28/2021  ? SIMVASTATIN PO Take by mouth.   05/27/2021  ? albuterol (VENTOLIN HFA) 108 (90 Base) MCG/ACT inhaler Inhale 2 puffs into the  lungs every 4 (four) hours as needed for wheezing or shortness of breath. 8 g 0 More than a month  ? benzonatate (TESSALON) 100 MG capsule Take 1 capsule (100 mg total) by mouth every 8 (eight) hours. 21 capsule 0 More than a month  ? cyclobenzaprine (FLEXERIL) 10 MG tablet Take 1 tablet (10 mg total) by mouth 2 (two) times daily as needed for muscle spasms. 20 tablet 0 More than a month  ? etonogestrel (NEXPLANON) 68 MG IMPL implant 1 each by Subdermal route once.    Unknown  ? fluticasone (FLONASE) 50 MCG/ACT nasal spray Place 1 spray into both nostrils daily. 16 g 2 More than a month  ? methylPREDNISolone (MEDROL DOSEPAK) 4 MG TBPK tablet Follow package insert 21 each 0 More than a month  ? ondansetron (ZOFRAN-ODT) 8 MG disintegrating tablet Take 1 tablet (8 mg total) by mouth every 8 (eight) hours as needed for nausea or vomiting. 20 tablet 0 More than a month  ? predniSONE (DELTASONE) 20 MG tablet Take 2 tablets (  40 mg total) by mouth daily. 10 tablet 0 More than a month  ? promethazine-dextromethorphan (PROMETHAZINE-DM) 6.25-15 MG/5ML syrup Take 5 mLs by mouth 4 (four) times daily as needed for cough. 118 mL 0 More than a month  ? rizatriptan (MAXALT-MLT) 10 MG disintegrating tablet Take 1 tab at onset of migraine.  May repeat in 2 hrs, if needed.  Max dose: 2 tabs/day. This is a 30 day prescription. 12 tablet 11 More than a month  ? ? ?Review of Systems  ?Gastrointestinal:  Positive for abdominal pain.  ?Musculoskeletal:  Positive for back pain.  ?All other systems reviewed and are negative. ?Physical Exam  ? ?Blood pressure 118/74, pulse 79, temperature 99 ?F (37.2 ?C), temperature source Oral, resp. rate 16, height 5\' 3"  (1.6 m), weight 72.8 kg, last menstrual period 04/18/2021, SpO2 100 %. ? ?Physical Exam ?Vitals and nursing note reviewed. Exam conducted with a chaperone present.  ?Constitutional:   ?   Appearance: She is well-developed.  ?Cardiovascular:  ?   Rate and Rhythm: Normal rate and regular  rhythm.  ?   Heart sounds: Normal heart sounds.  ?Pulmonary:  ?   Effort: Pulmonary effort is normal.  ?   Breath sounds: Normal breath sounds.  ?Abdominal:  ?   General: Bowel sounds are normal.  ?   Palpations: Abdomen is soft.  ?   Tenderness: There is no abdominal tenderness. There is no right CVA tenderness or left CVA tenderness.  ?Neurological:  ?   Mental Status: She is alert.  ? ? ?MAU Course  ?Procedures ? ?MDM ?Orders Placed This Encounter  ?Procedures  ? Wet prep, genital  ? 04/20/2021 OB LESS THAN 14 WEEKS WITH OB TRANSVAGINAL  ? Urinalysis, Routine w reflex microscopic  ? CBC  ? hCG, quantitative, pregnancy  ? Nursing communication  ? Pregnancy, urine POC  ? Discharge patient  ? ?Patient Vitals for the past 24 hrs: ? BP Temp Temp src Pulse Resp SpO2 Height Weight  ?05/28/21 1055 113/60 -- -- 77 -- -- -- --  ?05/28/21 0930 118/74 -- -- 79 -- 100 % -- --  ?05/28/21 0908 107/68 99 ?F (37.2 ?C) Oral 84 16 99 % 5\' 3"  (1.6 m) 72.8 kg  ? ?Results for orders placed or performed during the hospital encounter of 05/28/21 (from the past 24 hour(s))  ?Pregnancy, urine POC     Status: Abnormal  ? Collection Time: 05/28/21  9:16 AM  ?Result Value Ref Range  ? Preg Test, Ur POSITIVE (A) NEGATIVE  ?Urinalysis, Routine w reflex microscopic     Status: Abnormal  ? Collection Time: 05/28/21  9:17 AM  ?Result Value Ref Range  ? Color, Urine YELLOW YELLOW  ? APPearance HAZY (A) CLEAR  ? Specific Gravity, Urine 1.016 1.005 - 1.030  ? pH 6.0 5.0 - 8.0  ? Glucose, UA NEGATIVE NEGATIVE mg/dL  ? Hgb urine dipstick NEGATIVE NEGATIVE  ? Bilirubin Urine NEGATIVE NEGATIVE  ? Ketones, ur NEGATIVE NEGATIVE mg/dL  ? Protein, ur NEGATIVE NEGATIVE mg/dL  ? Nitrite NEGATIVE NEGATIVE  ? Leukocytes,Ua NEGATIVE NEGATIVE  ?CBC     Status: Abnormal  ? Collection Time: 05/28/21  9:27 AM  ?Result Value Ref Range  ? WBC 10.6 (H) 4.0 - 10.5 K/uL  ? RBC 4.26 3.87 - 5.11 MIL/uL  ? Hemoglobin 12.2 12.0 - 15.0 g/dL  ? HCT 37.1 36.0 - 46.0 %  ? MCV 87.1 80.0  - 100.0 fL  ? MCH 28.6 26.0 - 34.0  pg  ? MCHC 32.9 30.0 - 36.0 g/dL  ? RDW 13.1 11.5 - 15.5 %  ? Platelets 366 150 - 400 K/uL  ? nRBC 0.0 0.0 - 0.2 %  ?hCG, quantitative, pregnancy     Status: Abnormal  ? Collection Time: 05/28/21  9:27 AM  ?Result Value Ref Range  ? hCG, Beta Chain, Quant, S 2,628 (H) <5 mIU/mL  ?Wet prep, genital     Status: None  ? Collection Time: 05/28/21  9:38 AM  ?Result Value Ref Range  ? Yeast Wet Prep HPF POC NONE SEEN NONE SEEN  ? Trich, Wet Prep NONE SEEN NONE SEEN  ? Clue Cells Wet Prep HPF POC NONE SEEN NONE SEEN  ? WBC, Wet Prep HPF POC <10 <10  ? Sperm NONE SEEN   ? ?US OB LESS THAN 14 WEEKS WITH OB TRANSVAGINAL ? ?Result Date: 05/28/2021 ?CLINICAL DATA:  First trimester pregnancy. Abdominal pain. LMP 04/18/2021. Beta HCG level pending. EXAM: OBSTETRIC <14 WK US AND TRANSVAGINAL OB US TECHNIQUE: Both transabdominal and transvaginal ultrasound examinations were performed for complete evaluation of the gestation as well as the maternal uterus, adnexal regions, and pelvic cul-de-sac. Transvaginal technique was performed to assess early pregnancy. COMPARISON:  None Available. FINDINGS: Intrauterine gestational sac: Single Yolk sac:  Probable small yolk sac, measuring 3 mm Embryo:  Not visualized Cardiac Activity: Not visualized MSD: 5.9 mm   5 w   2 d Subchorionic hemorrhage:  Small subchorionic hematoma. Maternal uterus/adnexae: Both maternal ovaries are visualized and appear normal. No free pelvic fluid. No suspicious adnexal findings. IMPRESSION: Probable early intrauterine gestational sac and yolk sac, but no fetal pole or cardiac activity yet visualized. No suspicious adnexal findings. Recommend follow-up quantitative B-HCG levels and follow-up US in 14 days to assess viability. This recommendation follows SRU consensus guidelines: Diagnostic Criteria for Nonviable Pregnancy Early in the First Trimester. Malva Limes Engl J Med 2013; 161:0960-45; 369:1443-51. Electronically Signed   By: Carey BullocksWilliam  Veazey M.D.    On: 05/28/2021 10:35   ? ? ?Assessment and Plan  ?--36 y.o. [id number] with confirmed IUP (+GS, + YS) ?--O NEG but not bleeding, Rhogam not indicated ?--Subchorionic Hematoma, pelvic rest advised ?--Dischar

## 2021-05-31 LAB — GC/CHLAMYDIA PROBE AMP (~~LOC~~) NOT AT ARMC
Chlamydia: NEGATIVE
Comment: NEGATIVE
Comment: NORMAL
Neisseria Gonorrhea: NEGATIVE

## 2021-06-17 ENCOUNTER — Ambulatory Visit: Payer: Medicare HMO | Admitting: Neurology

## 2021-06-28 NOTE — Progress Notes (Deleted)
PATIENT: [name] [name] DOB: 1985-06-10  REASON FOR VISIT: follow up HISTORY FROM: patient  HISTORY OF PRESENT ILLNESS: Today 06/28/21  HISTORY [name] [name] is a 36 year old female, seen in refer by ENT Dr. Billy Fischer for evaluation of headaches, primary care physician is Dr. Midge Aver, [facility], initial evaluation was on January 26, 2017.   I reviewed and summarized the referring note, she has past medical history of depression, fetal alcohol syndrome.   She reported history of migraine headaches since 2011, her typical migraine are lateralized severe pounding headache with associated light noise, smell sensitivity, lasting for a few hours to 1 day.   She reported increased frequency of migraine headaches since 2018, can happen every other day, took sumatriptan 3-4 hours to help her headaches.   Trigger for her migraines are stress, bright light, strong smells,   Since 2018, she also noticed intermittent left facial, arm numbness during intense headaches,   We have personally reviewed CT head without contrast in October 2017 there was no significant abnormality.   UPDATE April 25 2017: MRI brain on April 23 2017 was normal.    She has been taking nortriptyline 10 mg up to 2 tablets every night as migraine prevention, Maxalt as needed was helpful, she rarely has migraine.   UPDATE Nov 20 2018: She presented to the emergency room November 19, 2018 for shortness of breath, there was a kerosene heater left inside the home of her mother, she came to the house and was overcome by fumes.  She felt weakness, headache, shortness of breath, laboratory evaluation showed elevated carboxyhemoglobin 5.1, normal BMP, WBC was 15.9, negative HIV, RPR,   She now complains of holoacranial headache, generalized weakness, previous nortriptyline works well for her headache, Maxalt was helpful, but she has run out of prescription   Update May 23, 2019 SS: Here today for follow-up,  unaccompanied for migraine headache.  She has not been taking nortriptyline consistently, says when outside, it makes her skin burn.  Unclear, if it was beneficial for headache.  She has been taking Maxalt with good benefit. On average she is having 2-4 migraines a week.  Reportedly she reports increased stress, her mom passed away earlier this year, she has 4 kids, she does not work, feels overwhelmed.  She recently had a new Nexplanon inserted.  Goes to the health department, has been told she is overweight.   Laboratory evaluation showed elevated CPK 261, TSH was normal  Update November 28, 2019 SS: Went to urgent care 10/27 for dizziness, pain in the left ear, recommended Flonase, Zyrtec, given 5 days of prednisone.  CT head 11/11/19, showed no acute intracranial abnormalities  Report dizziness for the last month, reports migraines once a week, never started Effexor, worried about" health risk" side effects.  Takes Maxalt as needed.  With dizziness, does describe frontal head pain, dizziness comes and goes, has to wear "tissue" in her left ear to control the dizziness, worse when she looks up, triggered by smells.  Saw PCP, thinks she was given Zofran or meclizine with benefit.  Has some left ear pain, went to urgent care, never took prescribed medications.  Seeing ENT 12/12/19.  Under a lot of stress. Otherwise is baseline, no weakness.  Reported history of vertigo, went away.   Update July 14, 2020 SS: Has 4 kids, her mother passed away. Reports can't lay on her right side, makes her dizzy. Migraines are triggered by coffee, onions, certain foods. Reports 4 migraines  weekly, related to her kids being out for summer. Has Nexplanon implant. 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 Maxalt works well.   Update June 29, 2021 SS:   REVIEW OF SYSTEMS: Out of a complete 14 system review of symptoms, the patient complains only of the following symptoms, and all other  reviewed systems are negative.  See HPI  ALLERGIES: Allergies  Allergen Reactions   Ibuprofen Swelling    Eyes swelled    Septra [Sulfamethoxazole-Trimethoprim] Hives    legs    HOME MEDICATIONS: Outpatient Medications Prior to Visit  Medication Sig Dispense Refill   albuterol (VENTOLIN HFA) 108 (90 Base) MCG/ACT inhaler Inhale 2 puffs into the lungs every 4 (four) hours as needed for wheezing or shortness of breath. 8 g 0   fluticasone (FLONASE) 50 MCG/ACT nasal spray Place 1 spray into both nostrils daily. 16 g 2   methylPREDNISolone (MEDROL DOSEPAK) 4 MG TBPK tablet Follow package insert 21 each 0   ondansetron (ZOFRAN-ODT) 8 MG disintegrating tablet Take 1 tablet (8 mg total) by mouth every 8 (eight) hours as needed for nausea or vomiting. 20 tablet 0   predniSONE (DELTASONE) 20 MG tablet Take 2 tablets (40 mg total) by mouth daily. 10 tablet 0   Prenatal Vit-Fe Fumarate-FA (PRENATAL MULTIVITAMIN) TABS tablet Take 1 tablet by mouth daily at 12 noon.     rizatriptan (MAXALT-MLT) 10 MG disintegrating tablet Take 1 tab at onset of migraine.  May repeat in 2 hrs, if needed.  Max dose: 2 tabs/day. This is a 30 day prescription. 12 tablet 11   Facility-Administered Medications Prior to Visit  Medication Dose Route Frequency Provider Last Rate Last Admin   rho(d) immune globulin (RHIG/Rhophylac) injection 300 mcg  300 mcg Intramuscular Once Kathreen Cosier, MD        PAST MEDICAL HISTORY: 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   History of chlamydia    Migraine     PAST SURGICAL HISTORY: Past Surgical History:  Procedure Laterality Date   APPENDECTOMY     EYE SURGERY     TYMPANOSTOMY TUBE PLACEMENT     WISDOM TOOTH EXTRACTION      FAMILY HISTORY: Family History  Problem Relation Age of Onset   Alcohol abuse Mother    Seizures Father    Alcohol abuse Father     SOCIAL  HISTORY: Social History   Socioeconomic History   Marital status: Single    Spouse name: Not on file   Number of children: 4   Years of education: 1 year college   Highest education level: Not on file  Occupational History   Occupation: Unemployed  Tobacco Use   Smoking status: Former    Types: Cigarettes   Smokeless tobacco: Never  Building services engineer Use: Never used  Substance and Sexual Activity   Alcohol use: Not Currently    Comment: occasionally   Drug use: Not Currently   Sexual activity: Yes  Other Topics Concern   Not on file  Social History Narrative   Lives at home with her children.   Right-handed.   1 to 1.5 cups caffeine per day.   Social Determinants of Health   Financial Resource Strain: Not on file  Food Insecurity: Not on file  Transportation Needs: Not on file  Physical Activity: Not on file  Stress: Not on file  Social Connections: Not on file  Intimate Partner Violence: Not on file   PHYSICAL EXAM  There were no vitals filed for this visit.   There is no height or weight on file to calculate BMI.  Generalized: Well developed, in no acute distress, more put together today than normal, thoughts seem clearer, more focused Neurological examination  Mentation: Alert oriented to time, place, history taking. Follows all commands speech and language fluent Cranial nerve II-XII: Pupils were equal round reactive to light, but left eye is smaller than right (reported baseline).  Extraocular movements were full, visual field were full on confrontational test. Facial sensation and strength were normal.  Head turning and shoulder shrug  were normal and symmetric. Motor: The motor testing reveals 5 over 5 strength of all 4 extremities. Good symmetric motor tone is noted throughout.  Sensory: Sensory testing is intact to soft touch on all 4 extremities. No evidence of extinction is noted.  Coordination: Cerebellar testing reveals good finger-nose-finger and  heel-to-shin bilaterally.  Gait and station: Gait is normal, but cautious. Reflexes: Deep tendon reflexes are symmetric and normal bilaterally.   DIAGNOSTIC DATA (LABS, IMAGING, TESTING) - I reviewed patient records, labs, notes, testing and imaging myself where available.  Lab Results  Component Value Date   WBC 10.6 (H) 05/28/2021   HGB 12.2 05/28/2021   HCT 37.1 05/28/2021   MCV 87.1 05/28/2021   PLT 366 05/28/2021      Component Value Date/Time   NA 135 01/26/2021 1330   K 3.6 01/26/2021 1330   CL 105 01/26/2021 1330   CO2 24 01/26/2021 1330   GLUCOSE 103 (H) 01/26/2021 1330   BUN 9 01/26/2021 1330   CREATININE 0.87 01/26/2021 1330   CALCIUM 8.6 (L) 01/26/2021 1330   PROT 7.3 01/26/2021 1330   ALBUMIN 3.7 01/26/2021 1330   AST 18 01/26/2021 1330   ALT 18 01/26/2021 1330   ALKPHOS 65 01/26/2021 1330   BILITOT 0.7 01/26/2021 1330   GFRNONAA >60 01/26/2021 1330   GFRAA >60 11/19/2018 2230   No results found for: CHOL, HDL, LDLCALC, LDLDIRECT, TRIG, CHOLHDL No results found for: [id number] No results found for: VITAMINB12 Lab Results  Component Value Date   TSH 1.770 11/20/2018    ASSESSMENT AND PLAN 36 y.o. year old female  has a past medical history of Depression, Fetal alcohol syndrome, History of chlamydia, and Migraine. here with:  1.  Chronic migraine headaches, associated left hemiparesthesia 2.  Fetal alcohol syndrome 3.  Kerosene fume exposure October 2020 4.  Reported dizziness -Start Topamax 50 mg at bedtime for migraine prevention -Continue Maxalt as needed for acute headache -Has known migraine triggers, increased stress -Could not tolerate nortriptyline, never started Effexor -CT head 11/11/19 showed no acute intracranial abnormalities -Continue follow-up with PCP, follow-up at our office in 1 year or sooner if needed  [name] [name], AGNP-C, DNP 06/28/2021, 5:57 AM [facility] [address], [address] [place], [place] [address] [phone]  [phone]

## 2021-06-29 ENCOUNTER — Ambulatory Visit: Payer: Medicare HMO | Admitting: Neurology

## 2021-06-30 ENCOUNTER — Encounter: Payer: Self-pay | Admitting: Neurology

## 2021-07-01 ENCOUNTER — Encounter (HOSPITAL_BASED_OUTPATIENT_CLINIC_OR_DEPARTMENT_OTHER): Payer: Self-pay | Admitting: Obstetrics and Gynecology

## 2021-07-01 NOTE — Progress Notes (Signed)
07/01/2021 5:46 PM Spoke w/ via phone for pre-op interview---patient and neighbor Lab needs dos---- per protocol             Lab results------in EPIC COVID test -----patient states asymptomatic no test needed Arrive at -------1200 NPO after MN NO Solid Food.  Clear liquids from MN until---1000 Med rec completed Medications to take morning of surgery -----none Diabetic medication -----none  Patient instructed no nail polish to be worn day of surgery Patient instructed to bring photo id and insurance card day of surgery Patient aware to have Driver (ride ) / caregiver    for 24 hours after surgery Home with neighbor Patient Special Instructions ----- Pre-Op special Istructions ----- Patient verbalized understanding of instructions that were given at this phone interview. Patient denies shortness of breath, chest pain, fever, cough at this phone interview.  Lanina Larranaga, Blanchard Kelch

## 2021-07-02 ENCOUNTER — Other Ambulatory Visit: Payer: Self-pay

## 2021-07-02 ENCOUNTER — Ambulatory Visit (HOSPITAL_BASED_OUTPATIENT_CLINIC_OR_DEPARTMENT_OTHER): Payer: Medicare HMO | Admitting: Certified Registered Nurse Anesthetist

## 2021-07-02 ENCOUNTER — Encounter (HOSPITAL_BASED_OUTPATIENT_CLINIC_OR_DEPARTMENT_OTHER): Payer: Self-pay | Admitting: Obstetrics and Gynecology

## 2021-07-02 ENCOUNTER — Ambulatory Visit (HOSPITAL_BASED_OUTPATIENT_CLINIC_OR_DEPARTMENT_OTHER)
Admission: RE | Admit: 2021-07-02 | Discharge: 2021-07-02 | Disposition: A | Discharge status: Home / Self Care | Payer: Medicare HMO | Attending: Obstetrics and Gynecology | Admitting: Obstetrics and Gynecology

## 2021-07-02 ENCOUNTER — Encounter (HOSPITAL_BASED_OUTPATIENT_CLINIC_OR_DEPARTMENT_OTHER)
Admission: RE | Disposition: A | Discharge status: Home / Self Care | Payer: Self-pay | Source: Home / Self Care | Attending: Obstetrics and Gynecology

## 2021-07-02 ENCOUNTER — Other Ambulatory Visit (HOSPITAL_COMMUNITY): Payer: Self-pay

## 2021-07-02 DIAGNOSIS — O021 Missed abortion: Secondary | ICD-10-CM | POA: Diagnosis not present

## 2021-07-02 DIAGNOSIS — F17210 Nicotine dependence, cigarettes, uncomplicated: Secondary | ICD-10-CM | POA: Diagnosis not present

## 2021-07-02 DIAGNOSIS — F79 Unspecified intellectual disabilities: Secondary | ICD-10-CM | POA: Diagnosis not present

## 2021-07-02 HISTORY — PX: DILATION AND EVACUATION: SHX1459

## 2021-07-02 LAB — TYPE AND SCREEN
ABO/RH(D): O NEG
Antibody Screen: POSITIVE

## 2021-07-02 SURGERY — DILATION AND EVACUATION, UTERUS
Anesthesia: General

## 2021-07-02 MED ORDER — POVIDONE-IODINE 10 % EX SWAB: 2.0000 "application " | Freq: Once | CUTANEOUS | Status: DC

## 2021-07-02 MED ORDER — LIDOCAINE-EPINEPHRINE 1 %-1:100000 IJ SOLN
INTRAMUSCULAR | Status: DC | PRN
  Administered 2021-07-02: 10 mL

## 2021-07-02 MED ORDER — FENTANYL CITRATE (PF) 100 MCG/2ML IJ SOLN
INTRAMUSCULAR | Status: AC
  Filled 2021-07-02: qty 2

## 2021-07-02 MED ORDER — HEPARIN SOD (PORK) LOCK FLUSH 100 UNIT/ML IV SOLN
INTRAVENOUS | Status: AC
  Filled 2021-07-02: qty 5

## 2021-07-02 MED ORDER — KETOROLAC TROMETHAMINE 30 MG/ML IJ SOLN
INTRAMUSCULAR | Status: AC
  Filled 2021-07-02: qty 1

## 2021-07-02 MED ORDER — DEXMEDETOMIDINE (PRECEDEX) IN NS 20 MCG/5ML (4 MCG/ML) IV SYRINGE
PREFILLED_SYRINGE | INTRAVENOUS | Status: AC
  Filled 2021-07-02: qty 5

## 2021-07-02 MED ORDER — PROPOFOL 10 MG/ML IV BOLUS
INTRAVENOUS | Status: AC
  Filled 2021-07-02: qty 20

## 2021-07-02 MED ORDER — LIDOCAINE HCL (PF) 2 % IJ SOLN
INTRAMUSCULAR | Status: AC
  Filled 2021-07-02: qty 5

## 2021-07-02 MED ORDER — KETOROLAC TROMETHAMINE 30 MG/ML IJ SOLN
INTRAMUSCULAR | Status: DC | PRN
  Administered 2021-07-02: 30 mg via INTRAVENOUS

## 2021-07-02 MED ORDER — MIDAZOLAM HCL 2 MG/2ML IJ SOLN
INTRAMUSCULAR | Status: DC | PRN
  Administered 2021-07-02: 2 mg via INTRAVENOUS

## 2021-07-02 MED ORDER — OXYCODONE HCL 5 MG/5ML PO SOLN: 5.0000 mg | Freq: Once | ORAL | Status: DC | PRN

## 2021-07-02 MED ORDER — ONDANSETRON HCL 4 MG/2ML IJ SOLN: 4.0000 mg | Freq: Once | INTRAMUSCULAR | Status: DC | PRN

## 2021-07-02 MED ORDER — ONDANSETRON HCL 4 MG/2ML IJ SOLN
INTRAMUSCULAR | Status: DC | PRN
  Administered 2021-07-02: 4 mg via INTRAVENOUS

## 2021-07-02 MED ORDER — IBUPROFEN 800 MG PO TABS
800.0000 mg | ORAL_TABLET | Freq: Three times a day (TID) | ORAL | 0 refills | Status: DC | PRN
  Filled 2021-07-02: qty 30, 10d supply, fill #0

## 2021-07-02 MED ORDER — LIDOCAINE 2% (20 MG/ML) 5 ML SYRINGE
INTRAMUSCULAR | Status: DC | PRN
  Administered 2021-07-02: 60 mg via INTRAVENOUS

## 2021-07-02 MED ORDER — OXYCODONE HCL 5 MG PO TABS: 5.0000 mg | ORAL_TABLET | Freq: Once | ORAL | Status: DC | PRN

## 2021-07-02 MED ORDER — MIDAZOLAM HCL 2 MG/2ML IJ SOLN
INTRAMUSCULAR | Status: AC
  Filled 2021-07-02: qty 2

## 2021-07-02 MED ORDER — KETOROLAC TROMETHAMINE 30 MG/ML IJ SOLN: 30.0000 mg | Freq: Once | INTRAMUSCULAR | Status: DC | PRN

## 2021-07-02 MED ORDER — ONDANSETRON HCL 4 MG/2ML IJ SOLN
INTRAMUSCULAR | Status: AC
  Filled 2021-07-02: qty 2

## 2021-07-02 MED ORDER — DEXAMETHASONE SODIUM PHOSPHATE 10 MG/ML IJ SOLN
INTRAMUSCULAR | Status: DC | PRN
  Administered 2021-07-02: 10 mg via INTRAVENOUS

## 2021-07-02 MED ORDER — HYDROMORPHONE HCL 1 MG/ML IJ SOLN: 0.2500 mg | INTRAMUSCULAR | Status: DC | PRN

## 2021-07-02 MED ORDER — DEXMEDETOMIDINE (PRECEDEX) IN NS 20 MCG/5ML (4 MCG/ML) IV SYRINGE
PREFILLED_SYRINGE | INTRAVENOUS | Status: DC | PRN
  Administered 2021-07-02 (×2): 4 ug via INTRAVENOUS

## 2021-07-02 MED ORDER — DEXAMETHASONE SODIUM PHOSPHATE 10 MG/ML IJ SOLN
INTRAMUSCULAR | Status: AC
  Filled 2021-07-02: qty 1

## 2021-07-02 MED ORDER — FENTANYL CITRATE (PF) 100 MCG/2ML IJ SOLN
INTRAMUSCULAR | Status: DC | PRN
  Administered 2021-07-02: 50 ug via INTRAVENOUS

## 2021-07-02 MED ORDER — LACTATED RINGERS IV SOLN: INTRAVENOUS | Status: DC

## 2021-07-02 MED ORDER — SODIUM CHLORIDE 0.9 % IR SOLN
Status: DC | PRN
  Administered 2021-07-02: 1000 mL

## 2021-07-02 MED ORDER — PROPOFOL 10 MG/ML IV BOLUS
INTRAVENOUS | Status: DC | PRN
  Administered 2021-07-02: 120 mg via INTRAVENOUS

## 2021-07-02 MED ORDER — DOXYCYCLINE HYCLATE 100 MG IV SOLR
200.0000 mg | Freq: Once | INTRAVENOUS | Status: AC
  Administered 2021-07-02: 200 mg via INTRAVENOUS
  Filled 2021-07-02: qty 200

## 2021-07-02 SURGICAL SUPPLY — 25 items
CATH ROBINSON RED A/P 16FR (CATHETERS) ×2 IMPLANT
DECANTER SPIKE VIAL GLASS SM (MISCELLANEOUS) ×2 IMPLANT
DRSG TELFA 3X8 NADH (GAUZE/BANDAGES/DRESSINGS) ×2 IMPLANT
FILTER UTR ASPR ASSEMBLY (MISCELLANEOUS) ×2 IMPLANT
GAUZE 4X4 16PLY ~~LOC~~+RFID DBL (SPONGE) ×2 IMPLANT
GLOVE BIOGEL PI IND STRL 6.5 (GLOVE) ×1 IMPLANT
GLOVE BIOGEL PI IND STRL 7.0 (GLOVE) ×1 IMPLANT
GLOVE BIOGEL PI INDICATOR 6.5 (GLOVE) ×1
GLOVE BIOGEL PI INDICATOR 7.0 (GLOVE) ×1
GLOVE ECLIPSE 6.5 STRL STRAW (GLOVE) ×2 IMPLANT
GOWN STRL REUS W/TWL LRG LVL3 (GOWN DISPOSABLE) ×2 IMPLANT
HOSE CONNECTING 18IN BERKELEY (TUBING) ×2 IMPLANT
KIT BERKELEY 1ST TRIMESTER 3/8 (MISCELLANEOUS) ×4 IMPLANT
KIT TURNOVER CYSTO (KITS) ×2 IMPLANT
NS IRRIG 1000ML POUR BTL (IV SOLUTION) ×2 IMPLANT
PACK VAGINAL MINOR WOMEN LF (CUSTOM PROCEDURE TRAY) ×2 IMPLANT
PAD DRESSING TELFA 3X8 NADH (GAUZE/BANDAGES/DRESSINGS) ×1 IMPLANT
PAD OB MATERNITY 4.3X12.25 (PERSONAL CARE ITEMS) ×2 IMPLANT
SET BERKELEY SUCTION TUBING (SUCTIONS) ×2 IMPLANT
TOWEL OR 17X26 10 PK STRL BLUE (TOWEL DISPOSABLE) ×4 IMPLANT
UNDERPAD 30X36 HEAVY ABSORB (UNDERPADS AND DIAPERS) ×2 IMPLANT
VACURETTE 10 RIGID CVD (CANNULA) IMPLANT
VACURETTE 7MM CVD STRL WRAP (CANNULA) ×1 IMPLANT
VACURETTE 8 RIGID CVD (CANNULA) IMPLANT
VACURETTE 9 RIGID CVD (CANNULA) IMPLANT

## 2021-07-02 NOTE — Anesthesia Procedure Notes (Signed)
Procedure Name: LMA Insertion Date/Time: 07/02/2021 1:05 PM  Performed by: Norva Pavlov, CRNAPre-anesthesia Checklist: Patient identified, Emergency Drugs available, Suction available and Patient being monitored Patient Re-evaluated:Patient Re-evaluated prior to induction Oxygen Delivery Method: Circle system utilized Preoxygenation: Pre-oxygenation with 100% oxygen Induction Type: IV induction Ventilation: Mask ventilation without difficulty LMA: LMA inserted LMA Size: 4.0 Number of attempts: 1 Airway Equipment and Method: Bite block Placement Confirmation: positive ETCO2 Tube secured with: Tape Dental Injury: Teeth and Oropharynx as per pre-operative assessment

## 2021-07-02 NOTE — Op Note (Signed)
07/02/21  Preoperative Diagnosis: Missed AB @ 5 5/7 Postoperative Diagnosis: Same as above Procedure: Suction dilation and curettage Surgeon: Ellison Hughs, MD Anesthesia: LMA by Dr Richardson Landry IVF: 500cc UOP: Voided prior to procedure EBL: <10cc   Findings: On bimanual exam, approx 8wk size uterus, mobile, anteverted: BL adenxa WNL. Uterus sounded to 10cm. Upon insertion of operative speculum, multiparous closed vercial os noted   Specimen: Curettings to pathology Complications: none   Patient is a 36yo [id number] with missed Ab at 5 5/7. GS present on 5/5 with no further progression on 6/5, thus meeting criteria. All options reviewed, patient elected for surgical management   Operative procedure: Pt was taken to operating room where LMA anesthesia was obtained without difficulty.  She was prepped and draped in the normal sterile fashion in the dorsal lithotomy position.  An appropriate timeout was performed.  A speculum was placed in the vagina and 2cc of 1% w/ epi lidocaine injected into the anterior cervix.  An additional 9cc was placed both at 2 and 10 o'clock for a paracervical block. A single tooth tenaculum was placed on the anterior lip of the cervix. Cervix gently dilated past internal os using Pratt dilators (21). The 56mm suction currette was obtained and easily introduced into the fundus.  With 3 passes, a significant amount of tissue was obtained.  When no further tissue was noted, the suction was discontinued and a gentle currettage performed.  1 additional pass revealed no further tissue. The tenaculum was removed and pressure used for hemostasis. All instruments and sponges were removed from vagina and the patient awakened and taken to the PACU in good condition.

## 2021-07-02 NOTE — Transfer of Care (Signed)
Immediate Anesthesia Transfer of Care Note  Patient: [name] [name]  Procedure(s) Performed: Procedure(s) (LRB): DILATATION AND EVACUATION (N/A)  Patient Location: PACU  Anesthesia Type: General  Level of Consciousness: awake, alert  and oriented  Airway & Oxygen Therapy: Patient Spontanous Breathing and Patient connected to face mask oxygen  Post-op Assessment: Report given to PACU RN and Post -op Vital signs reviewed and stable  Post vital signs: Reviewed and stable  Complications: No apparent anesthesia complications  Last Vitals:  Vitals Value Taken Time  BP 117/49 07/02/21 1534  Temp 36.8 C 07/02/21 1537  Pulse 118 07/02/21 1538  Resp 22 07/02/21 1538  SpO2 96 % 07/02/21 1538  Vitals shown include unvalidated device data.  Last Pain:  Vitals:   07/02/21 1239  TempSrc:   PainSc: 6       Patients Stated Pain Goal: 5 (07/02/21 1239)  Complications: No notable events documented.

## 2021-07-02 NOTE — Anesthesia Preprocedure Evaluation (Addendum)
Anesthesia Evaluation  Patient identified by MRN, date of birth, ID band Patient awake    Reviewed: Allergy & Precautions, NPO status , Patient's Chart, lab work & pertinent test results  Airway Mallampati: II  TM Distance: >3 FB Neck ROM: Full    Dental no notable dental hx. (+) Teeth Intact, Dental Advisory Given   Pulmonary neg pulmonary ROS, Current Smoker and Patient abstained from smoking.,    Pulmonary exam normal breath sounds clear to auscultation       Cardiovascular Exercise Tolerance: Good negative cardio ROS Normal cardiovascular exam Rhythm:Regular Rate:Normal     Neuro/Psych  Headaches, PSYCHIATRIC DISORDERS Depression    GI/Hepatic Neg liver ROS,   Endo/Other  negative endocrine ROSHx of Fetal etoh   Renal/GU negative Renal ROS     Musculoskeletal negative musculoskeletal ROS (+)   Abdominal   Peds  Hematology   Anesthesia Other Findings All: Septra, Ibuprofen  Reproductive/Obstetrics (+) Pregnancy                          Anesthesia Physical Anesthesia Plan  ASA: 2  Anesthesia Plan: General   Post-op Pain Management:    Induction: Intravenous  PONV Risk Score and Plan: 3 and Treatment may vary due to age or medical condition, Ondansetron, Midazolam and Dexamethasone  Airway Management Planned: Oral ETT and LMA  Additional Equipment: None  Intra-op Plan:   Post-operative Plan: Extubation in OR  Informed Consent: I have reviewed the patients History and Physical, chart, labs and discussed the procedure including the risks, benefits and alternatives for the proposed anesthesia with the patient or authorized representative who has indicated his/her understanding and acceptance.     Dental advisory given  Plan Discussed with:   Anesthesia Plan Comments:        Anesthesia Quick Evaluation

## 2021-07-02 NOTE — Anesthesia Postprocedure Evaluation (Signed)
Anesthesia Post Note  Patient: [name] [name]  Procedure(s) Performed: DILATATION AND EVACUATION CHROMASOMAL STUDIES     Patient location during evaluation: PACU Anesthesia Type: General Level of consciousness: awake and alert Pain management: pain level controlled Vital Signs Assessment: post-procedure vital signs reviewed and stable Respiratory status: spontaneous breathing, nonlabored ventilation, respiratory function stable and patient connected to nasal cannula oxygen Cardiovascular status: blood pressure returned to baseline and stable Postop Assessment: no apparent nausea or vomiting Anesthetic complications: no   No notable events documented.  Last Vitals:  Vitals:   07/02/21 1226 07/02/21 1537  BP: 117/79 (!) 117/49  Pulse: 93 (!) 124  Resp: 16 (!) 23  Temp: 36.4 C 36.8 C  SpO2: 100% 95%    Last Pain:  Vitals:   07/02/21 1537  TempSrc:   PainSc: 0-No pain                 Trevor Iha

## 2021-07-02 NOTE — Discharge Instructions (Signed)

## 2021-07-02 NOTE — H&P (Signed)
[name] [name] is an 36 y.o. female is presenting for scheduled procedure. Had some increased cramping today but no VB. Of note, clarified allergies with patient. She states she takes Advil at home for headaches without any reaction including swelling, hives, N/V, anaphylaxis. Will amend allergies appropriately  Pertinent Gynecological History: [id number], h/o SVD x4. Pap UTD 2021, h/o HSV 1&2  Menstrual History: Menarche age: 52 Patient's last menstrual period was 04/18/2021 (approximate).    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   History of chlamydia    Migraine     Past Surgical History:  Procedure Laterality Date   APPENDECTOMY     EYE SURGERY     TYMPANOSTOMY TUBE PLACEMENT     WISDOM TOOTH EXTRACTION      Family History  Problem Relation Age of Onset   Alcohol abuse Mother    Seizures Father    Alcohol abuse Father     Social History:  reports that she has been smoking cigarettes. She has been smoking an average of .5 packs per day. She has never used smokeless tobacco. She reports current alcohol use. She reports that she does not currently use drugs.  Allergies:  Allergies  Allergen Reactions   Ibuprofen Swelling    Eyes swelled    Septra [Sulfamethoxazole-Trimethoprim] Hives    legs    Medications Prior to Admission  Medication Sig Dispense Refill Last Dose   albuterol (VENTOLIN HFA) 108 (90 Base) MCG/ACT inhaler Inhale 2 puffs into the lungs every 4 (four) hours as needed for wheezing or shortness of breath. 8 g 0    fluticasone (FLONASE) 50 MCG/ACT nasal spray Place 1 spray into both nostrils daily. 16 g 2 More than a month   methylPREDNISolone (MEDROL DOSEPAK) 4 MG TBPK tablet Follow package insert 21 each 0    ondansetron (ZOFRAN-ODT) 8 MG disintegrating tablet Take 1 tablet (8 mg total) by mouth every 8 (eight) hours as needed for nausea or vomiting. (Patient  not taking: Reported on 07/01/2021) 20 tablet 0 Not Taking   predniSONE (DELTASONE) 20 MG tablet Take 2 tablets (40 mg total) by mouth daily. 10 tablet 0    Prenatal Vit-Fe Fumarate-FA (PRENATAL MULTIVITAMIN) TABS tablet Take 1 tablet by mouth daily at 12 noon.      rizatriptan (MAXALT-MLT) 10 MG disintegrating tablet Take 1 tab at onset of migraine.  May repeat in 2 hrs, if needed.  Max dose: 2 tabs/day. This is a 30 day prescription. 12 tablet 11 More than a month    Review of Systems  Constitutional:  Negative for chills and fever.  Respiratory:  Negative for shortness of breath.   Cardiovascular:  Negative for chest pain, palpitations and leg swelling.  Gastrointestinal:  Negative for abdominal pain, nausea and vomiting.  Neurological:  Negative for dizziness, weakness and headaches.  Psychiatric/Behavioral:  Negative for suicidal ideas.     Blood pressure 117/79, pulse 93, temperature 97.6 F (36.4 C), temperature source Oral, resp. rate 16, height 5\' 3"  (1.6 m), weight 73.2 kg, last menstrual period 04/18/2021, SpO2 100 %. Physical Exam Gen: NAD CV; CTAB, RRR Abd: NTTP, lap site well healed GU deferred Psych/Neuro WNL  No results found for this or any previous visit (from the past 24 hour(s)).  No results found.  Assessment/Plan: This is a 36yo [id number] with missed AB at 5 5/7 diagnosed on 06/28/21. PMHx s/f  fetal alcohol syndrome and high functioning MR. Thorough explanation of optiosn rgarding EPL reviewed in-office with patient and family friend. Patient able to repeat back instructions and options without issue. D&C opted at this time with [facility]. The patient was informed of the risks and benefits of a hysteroscopy with dilation and curettage. Risks included but were not limited to bleeidng, infections, injury to the vulva, vagina or cerivx, or uterine perforation. If concern for latter, may proceed with diagnostic laparoscopy for evaluation of injury which may require surgical  repair. Patient understands and is amenable.  Will send postop ibuprofen to [facility] now so family friend (suzanne) may pick it up  Deliah Boston 07/02/2021, 1:33 PM

## 2021-07-05 ENCOUNTER — Encounter (HOSPITAL_BASED_OUTPATIENT_CLINIC_OR_DEPARTMENT_OTHER): Payer: Self-pay | Admitting: Obstetrics and Gynecology

## 2021-07-05 LAB — SURGICAL PATHOLOGY

## 2021-09-05 ENCOUNTER — Encounter (HOSPITAL_COMMUNITY): Payer: Self-pay

## 2021-09-05 ENCOUNTER — Other Ambulatory Visit: Payer: Self-pay

## 2021-09-05 ENCOUNTER — Emergency Department (HOSPITAL_COMMUNITY)
Admission: EM | Admit: 2021-09-05 | Discharge: 2021-09-05 | Disposition: A | Discharge status: Home / Self Care | Payer: Medicare HMO | Attending: Emergency Medicine | Admitting: Emergency Medicine

## 2021-09-05 ENCOUNTER — Emergency Department (HOSPITAL_COMMUNITY): Payer: Medicare HMO

## 2021-09-05 DIAGNOSIS — S39012A Strain of muscle, fascia and tendon of lower back, initial encounter: Secondary | ICD-10-CM | POA: Diagnosis not present

## 2021-09-05 DIAGNOSIS — M5416 Radiculopathy, lumbar region: Secondary | ICD-10-CM | POA: Diagnosis not present

## 2021-09-05 DIAGNOSIS — W19XXXA Unspecified fall, initial encounter: Secondary | ICD-10-CM | POA: Insufficient documentation

## 2021-09-05 DIAGNOSIS — S3992XA Unspecified injury of lower back, initial encounter: Secondary | ICD-10-CM | POA: Diagnosis present

## 2021-09-05 DIAGNOSIS — M5410 Radiculopathy, site unspecified: Secondary | ICD-10-CM

## 2021-09-05 LAB — I-STAT BETA HCG BLOOD, ED (MC, WL, AP ONLY)
I-stat hCG, quantitative: <5 m[IU]/mL (ref <5)
I-stat hCG, quantitative: <5 m[IU]/mL (ref <5)

## 2021-09-05 MED ORDER — FENTANYL CITRATE PF 50 MCG/ML IJ SOSY
50.0000 ug | PREFILLED_SYRINGE | Freq: Once | INTRAMUSCULAR | Status: AC
  Administered 2021-09-05: 50 ug via INTRAVENOUS
  Filled 2021-09-05: qty 1

## 2021-09-05 MED ORDER — IBUPROFEN 600 MG PO TABS: 600.0000 mg | ORAL_TABLET | Freq: Four times a day (QID) | ORAL | 0 refills | Status: DC | PRN

## 2021-09-05 MED ORDER — ACETAMINOPHEN 500 MG PO TABS: 500.0000 mg | ORAL_TABLET | Freq: Four times a day (QID) | ORAL | 0 refills | Status: AC | PRN

## 2021-09-05 MED ORDER — HYDROCODONE-ACETAMINOPHEN 5-325 MG PO TABS: 1.0000 | ORAL_TABLET | Freq: Once | ORAL | Status: DC

## 2021-09-05 MED ORDER — DIAZEPAM 5 MG PO TABS
5.0000 mg | ORAL_TABLET | Freq: Once | ORAL | Status: AC
  Administered 2021-09-05: 5 mg via ORAL
  Filled 2021-09-05: qty 1

## 2021-09-05 MED ORDER — KETOROLAC TROMETHAMINE 15 MG/ML IJ SOLN
15.0000 mg | Freq: Once | INTRAMUSCULAR | Status: AC
  Administered 2021-09-05: 15 mg via INTRAVENOUS
  Filled 2021-09-05: qty 1

## 2021-09-05 MED ORDER — OXYCODONE-ACETAMINOPHEN 5-325 MG PO TABS
1.0000 | ORAL_TABLET | Freq: Once | ORAL | Status: AC
  Administered 2021-09-05: 1 via ORAL
  Filled 2021-09-05: qty 1

## 2021-09-05 MED ORDER — METHOCARBAMOL 500 MG PO TABS: 500.0000 mg | ORAL_TABLET | Freq: Two times a day (BID) | ORAL | 0 refills | Status: DC

## 2021-09-05 NOTE — Discharge Instructions (Addendum)
We saw you in the ER for back pain. °The imaging in the exam is normal, and our exam don't indicate any spinal cord involvement - and thus we feel comfortable sending you home. °Please take the ibuprofen every 6 hours for the next 2 days, take the muscle relaxant as needed, and see your primary care doctor for further pain control.  °Please use the back exercises to strengthen the back muscles. ° °

## 2021-09-05 NOTE — ED Triage Notes (Signed)
Pt bib ems after fall pt c/o right leg pain. Pt screaming in triage

## 2021-09-05 NOTE — ED Provider Notes (Addendum)
[facility]-EMERGENCY DEPT Provider Note   CSN: [id number] Arrival date & time: 09/05/21  1507     History  Chief Complaint  Patient presents with   Leg Pain    [name] [name] is a 36 y.o. female.  HPI     36 y.o patient comes in with cc of back pain. She was lifting a case of water and started having sudden pain, that worsened quickly. Pain is in the back, radiating down the right leg. She has had similar pain in the past, but not to this extent. Pt has no associated numbness, weakness, urinary incontinence, urinary retention, bowel incontinence, pins and needle sensation in the perineal area.   Home Medications Prior to Admission medications   Medication Sig Start Date End Date Taking? Authorizing Provider  acetaminophen (TYLENOL) 500 MG tablet Take 1 tablet (500 mg total) by mouth every 6 (six) hours as needed. 09/05/21  Yes Derwood Kaplan, MD  ibuprofen (ADVIL) 600 MG tablet Take 1 tablet (600 mg total) by mouth every 6 (six) hours as needed. 09/05/21  Yes Derwood Kaplan, MD  methocarbamol (ROBAXIN) 500 MG tablet Take 1 tablet (500 mg total) by mouth 2 (two) times daily. 09/05/21  Yes Derwood Kaplan, MD  albuterol (VENTOLIN HFA) 108 (90 Base) MCG/ACT inhaler Inhale 2 puffs into the lungs every 4 (four) hours as needed for wheezing or shortness of breath. 01/07/21   White, Elita Boone, NP  fluticasone (FLONASE) 50 MCG/ACT nasal spray Place 1 spray into both nostrils daily. 07/03/18   Dahlia Byes A, NP  methylPREDNISolone (MEDROL DOSEPAK) 4 MG TBPK tablet Follow package insert 08/18/20   Curatolo, Adam, DO  predniSONE (DELTASONE) 20 MG tablet Take 2 tablets (40 mg total) by mouth daily. 01/07/21   Valinda Hoar, NP  Prenatal Vit-Fe Fumarate-FA (PRENATAL MULTIVITAMIN) TABS tablet Take 1 tablet by mouth daily at 12 noon.    [provider]  rizatriptan (MAXALT-MLT) 10 MG disintegrating tablet Take 1 tab at onset of migraine.  May repeat in 2 hrs, if  needed.  Max dose: 2 tabs/day. This is a 30 day prescription. 07/14/20   Glean Salvo, NP      Allergies    Septra [sulfamethoxazole-trimethoprim]    Review of Systems   Review of Systems  All other systems reviewed and are negative.   Physical Exam Updated Vital Signs BP 123/77 (BP Location: Right Arm)   Pulse 67   Temp 98.3 F (36.8 C) (Oral)   Resp (!) 24   Ht 5\' 3"  (1.6 m)   Wt 73.2 kg   LMP 09/04/2021 (Approximate)   SpO2 100%   Breastfeeding Unknown   BMI 28.59 kg/m  Physical Exam Vitals and nursing note reviewed.  Constitutional:      General: She is in acute distress.     Appearance: She is well-developed.     Comments: Distress due to pain  HENT:     Head: Atraumatic.  Cardiovascular:     Rate and Rhythm: Normal rate.  Pulmonary:     Effort: Pulmonary effort is normal.  Musculoskeletal:     Cervical back: Normal range of motion and neck supple.     Comments: Pt has tenderness over the lumbar region, right hip region No step offs, no erythema. Pt unable to relax for patellar reflex Able to discriminate between sharp and dull, but states that right leg feels numb compared to left.    Skin:    General: Skin is warm  and dry.  Neurological:     Mental Status: She is alert and oriented to person, place, and time.     ED Results / Procedures / Treatments   Labs (all labs ordered are listed, but only abnormal results are displayed) Labs Reviewed  I-STAT BETA HCG BLOOD, ED ([facility], [facility], [facility] ONLY)  I-STAT BETA HCG BLOOD, ED ([facility], [facility], [facility] ONLY)    EKG None  Radiology DG Lumbar Spine Complete  Result Date: 09/05/2021 CLINICAL DATA:  Lower back pain. EXAM: LUMBAR SPINE - COMPLETE 4+ VIEW COMPARISON:  None Available. FINDINGS: There is no evidence of lumbar spine fracture. Alignment is normal. Intervertebral disc spaces are maintained. IMPRESSION: Negative. Electronically Signed   By: Aram Candela M.D.   On: 09/05/2021 19:17    Procedures Procedures     Medications Ordered in ED Medications  oxyCODONE-acetaminophen (PERCOCET/ROXICET) 5-325 MG per tablet 1 tablet (has no administration in time range)  ketorolac (TORADOL) 15 MG/ML injection 15 mg (15 mg Intravenous Given 09/05/21 1620)  diazepam (VALIUM) tablet 5 mg (5 mg Oral Given 09/05/21 1620)  fentaNYL (SUBLIMAZE) injection 50 mcg (50 mcg Intravenous Given 09/05/21 1620)    ED Course/ Medical Decision Making/ A&P                           Medical Decision Making Amount and/or Complexity of Data Reviewed Radiology: ordered.  Risk OTC drugs. Prescription drug management.   This patient presents to the ED with chief complaint(s) of back pain with radiation to the right leg. Sudden onset, while lifting something while leaning forward.  The differential diagnosis includes  - DJD of the back - Spondylitises/ spondylosis - Sciatica - Musculoskeletal pain   No red flags concerning for cord compression/cauda equina.  The initial plan is to get xray of the back and focus on pain control.   Independent visualization of imaging: - I independently visualized the following imaging with scope of interpretation limited to determining acute life threatening conditions related to emergency care: xray of the back, which revealed no fracture  Treatment and Reassessment: Pt feels better from pain perspective, unless she moves. Will give more meds, stable for d/c after that.   The patient appears reasonably screened and/or stabilized for discharge and I doubt any other medical condition or other [facility] requiring further screening, evaluation, or treatment in the ED at this time prior to discharge.   Results from the ER workup discussed with the patient face to face and all questions answered to the best of my ability. The patient is safe for discharge with strict return precautions.   Final Clinical Impression(s) / ED Diagnoses Final diagnoses:  Radicular pain  Strain of lumbar  region, initial encounter    Rx / DC Orders ED Discharge Orders          Ordered    ibuprofen (ADVIL) 600 MG tablet  Every 6 hours PRN        09/05/21 1929    acetaminophen (TYLENOL) 500 MG tablet  Every 6 hours PRN        09/05/21 1929    methocarbamol (ROBAXIN) 500 MG tablet  2 times daily        09/05/21 1929              Derwood Kaplan, MD 09/05/21 1936    Derwood Kaplan, MD 09/05/21 [phone]

## 2021-09-05 NOTE — ED Provider Triage Note (Signed)
Emergency Medicine Provider Triage Evaluation Note  [name] [name] , a 36 y.o. female  was evaluated in triage.  Pt is brought in by EMS, screaming in pain, for evaluation of lower back and right leg pain that started suddenly after bending over to pick up a case of water.  Per EMS, after this happened, she got into her car and drove home, however when she tried to get out of her car she had increased pain with pain radiating down the back of her right leg and called EMS.  Reportedly had this happen before, but patient was not diagnosed with anything.  Denies numbness and tingling  Review of Systems  Positive:  Negative:   Physical Exam  BP (!) 125/102 (BP Location: Right Arm)   Pulse (!) 115   Temp 98.3 F (36.8 C) (Oral)   Resp (!) 22   Ht 5\' 3"  (1.6 m)   Wt 73.2 kg   LMP 04/18/2021 (Approximate)   SpO2 94%   BMI 28.59 kg/m  Gen:   Awake, distressed, screaming and yelling in pain, difficult to evaluate Resp:  Normal effort  MSK:   Moves extremities without difficulty; patient yelled in pain with smallest amount of pressure on her lumbar spine Other:    Medical Decision Making  Medically screening exam initiated at 3:28 PM.  Appropriate orders placed.  [name] [name] was informed that the remainder of the evaluation will be completed by another provider, this initial triage assessment does not replace that evaluation, and the importance of remaining in the ED until their evaluation is complete.     [name] [name], [name] [name] 09/05/21 1530

## 2021-10-02 IMAGING — DX DG CHEST 1V PORT
1 series · 1 of 1 positions shown · non-contrast
Comparison: 11/22/2017

CLINICAL DATA: Cough, COVID

EXAM:
PORTABLE CHEST 1 VIEW

[chest ap]
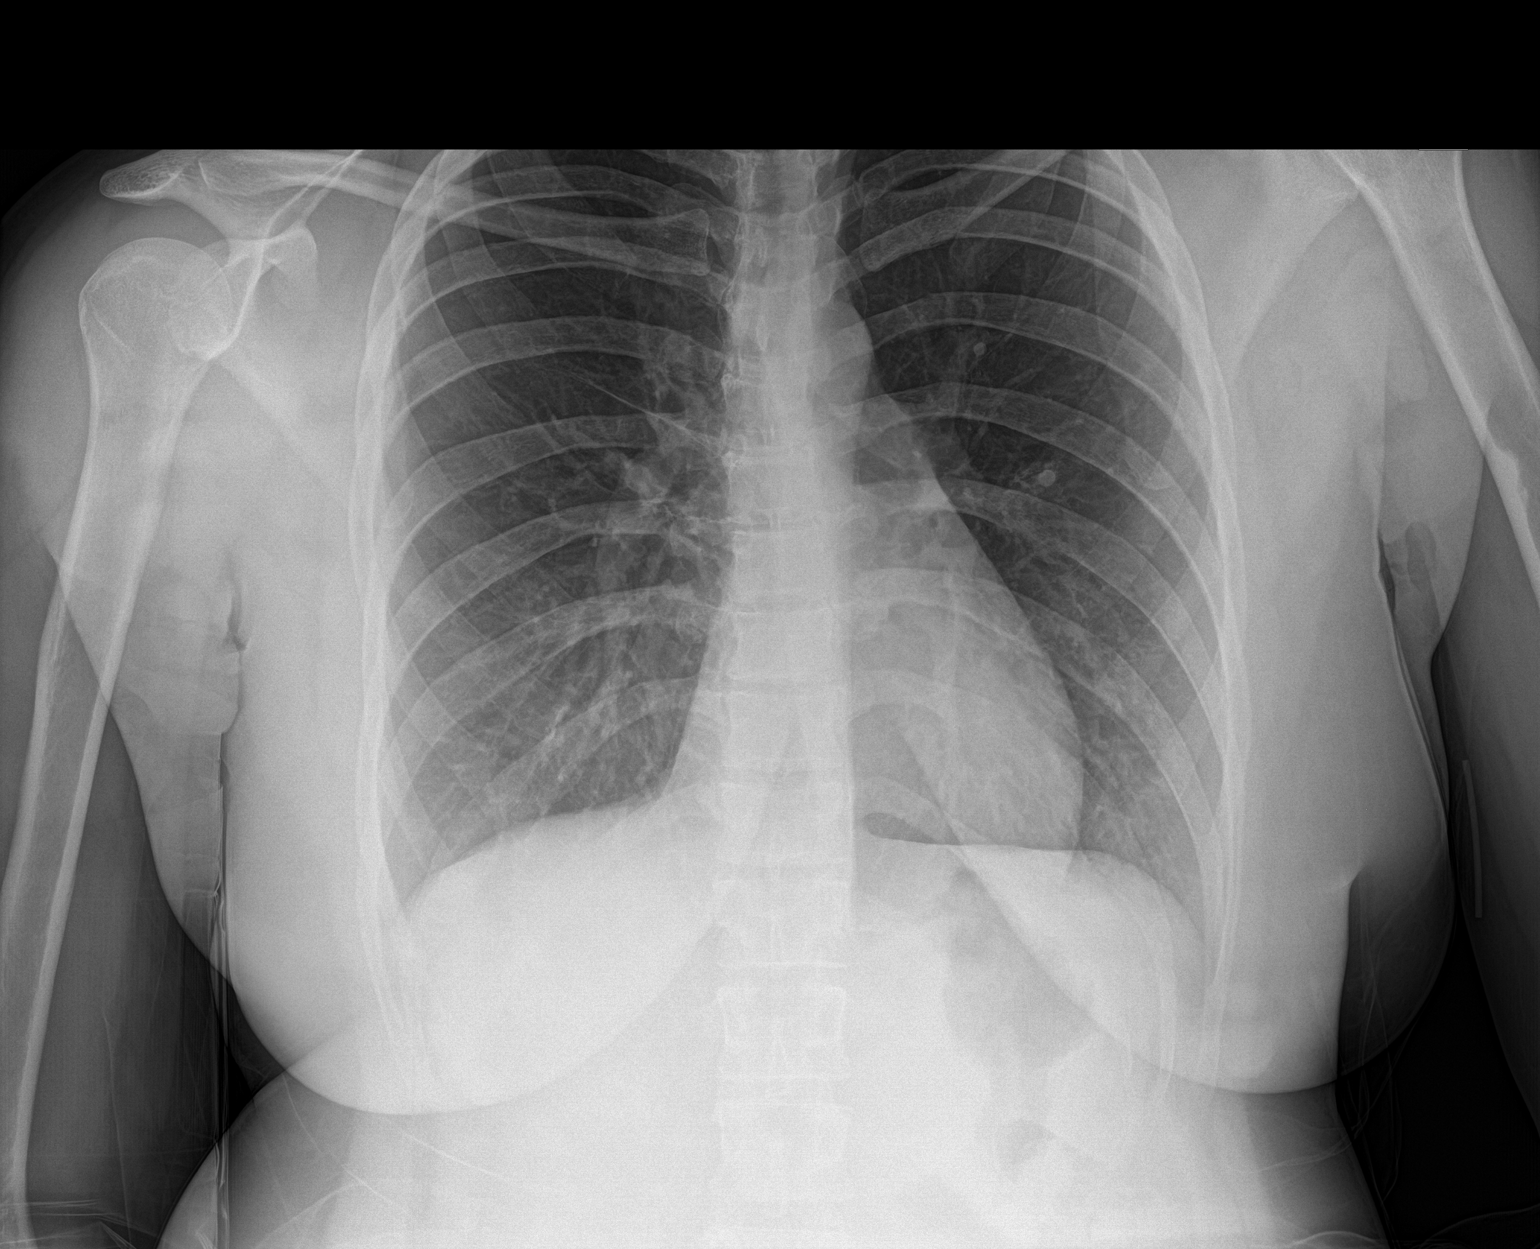

[1 of 1 positions shown; findings below may reference images not displayed]

FINDINGS: The heart size and mediastinal contours are within normal limits.
Both lungs are clear. Stable, definitively benign calcified nodules
of the left midlung. The visualized skeletal structures are
unremarkable.
IMPRESSION: No acute abnormality of the lungs in AP portable projection.

## 2021-10-04 DIAGNOSIS — M545 Low back pain, unspecified: Secondary | ICD-10-CM | POA: Insufficient documentation

## 2021-10-13 ENCOUNTER — Ambulatory Visit (HOSPITAL_COMMUNITY)
Admission: EM | Admit: 2021-10-13 | Discharge: 2021-10-13 | Disposition: A | Discharge status: Home / Self Care | Payer: Medicare HMO | Attending: Family Medicine | Admitting: Family Medicine

## 2021-10-13 DIAGNOSIS — R1110 Vomiting, unspecified: Secondary | ICD-10-CM | POA: Diagnosis present

## 2021-10-13 DIAGNOSIS — J029 Acute pharyngitis, unspecified: Secondary | ICD-10-CM | POA: Insufficient documentation

## 2021-10-13 DIAGNOSIS — Z20818 Contact with and (suspected) exposure to other bacterial communicable diseases: Secondary | ICD-10-CM | POA: Insufficient documentation

## 2021-10-13 LAB — POCT RAPID STREP A, ED / UC: Streptococcus, Group A Screen (Direct): NEGATIVE

## 2021-10-13 MED ORDER — AMOXICILLIN 875 MG PO TABS: 875.0000 mg | ORAL_TABLET | Freq: Two times a day (BID) | ORAL | 0 refills | Status: AC

## 2021-10-13 MED ORDER — PROMETHAZINE-DM 6.25-15 MG/5ML PO SYRP: 5.0000 mL | ORAL_SOLUTION | Freq: Four times a day (QID) | ORAL | 0 refills | Status: DC | PRN

## 2021-10-13 NOTE — ED Triage Notes (Signed)
Pt  children has been diagnosed  with strep . Pt now having symptoms nausea, sore throat, painful swallowing headache and chills x3 days

## 2021-10-13 NOTE — Discharge Instructions (Signed)
You may use over the counter ibuprofen or acetaminophen as needed.  For a sore throat, over the counter products such as [employer] Peroxyl Mouth Sore Rinse or Chloraseptic Sore Throat Spray may provide some temporary relief. Your rapid strep test was negative today. We have sent your throat swab for culture and will let you know of any positive results. 

## 2021-10-13 NOTE — ED Provider Notes (Signed)
[facility]   [id number] 10/13/21 Arrival Time: 2637  ASSESSMENT & PLAN:  1. Pharyngitis, unspecified etiology   2. Post-tussive emesis   3. Strep throat exposure    No signs of peritonsillar abscess. Discussed. Will tx empirically given strep exposure and throat exam. Question if throat inflammation/swelling causing her to gag/cough. Tolerating PO intake. No resp difficulties. Begin: Meds ordered this encounter  Medications   amoxicillin (AMOXIL) 875 MG tablet    Sig: Take 1 tablet (875 mg total) by mouth 2 (two) times daily for 10 days.    Dispense:  20 tablet    Refill:  0   promethazine-dextromethorphan (PROMETHAZINE-DM) 6.25-15 MG/5ML syrup    Sig: Take 5 mLs by mouth 4 (four) times daily as needed for cough.    Dispense:  118 mL    Refill:  0    Results for orders placed or performed during the hospital encounter of 10/13/21  POCT Rapid Strep A (ED/UC)  Result Value Ref Range   Streptococcus, Group A Screen (Direct) NEGATIVE NEGATIVE   Labs Reviewed  CULTURE, GROUP A STREP [facility])  POCT RAPID STREP A, ED / UC    OTC analgesics and throat care as needed   Discharge Instructions      You may use over the counter ibuprofen or acetaminophen as needed.  For a sore throat, over the counter products such as [employer] Peroxyl Mouth Sore Rinse or Chloraseptic Sore Throat Spray may provide some temporary relief. Your rapid strep test was negative today. We have sent your throat swab for culture and will let you know of any positive results.      Reviewed expectations re: course of current medical issues. Questions answered. Outlined signs and symptoms indicating need for more acute intervention. Patient verbalized understanding. After Visit Summary given.   SUBJECTIVE:  [name] [name] is a 36 y.o. female who reports a sore throat.Children with + strep 2-3 d ago. ST bothering her the most. With gagging/coughing and post-tussive emesis. Subj fever and  chills. No tx PTA.  OBJECTIVE:  Vitals:   10/13/21 0937 10/13/21 0939  BP: 100/68   Pulse: 98   Resp: 12   Temp: 99.1 F (37.3 C)   TempSrc: Oral   SpO2: 97%   Weight:  76.2 kg  Height:  5\' 3"  (1.6 m)     General appearance: alert; no distress HEENT: throat with marked erythema and with exudative tonsillar hypertrophy; uvula is midline Neck: supple with FROM; small bilateral cervical LAD Lungs: speaks full sentences without difficulty; unlabored Abd: soft; non-tender Skin: reveals no rash; warm and dry Psychological: alert and cooperative; normal mood and affect  Allergies  Allergen Reactions   Septra [Sulfamethoxazole-Trimethoprim] Hives    legs    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   History of chlamydia    Migraine    Social History   Socioeconomic History   Marital status: Single    Spouse name: Not on file   Number of children: 4   Years of education: 1 year college   Highest education level: Not on file  Occupational History   Occupation: Unemployed  Tobacco Use   Smoking status: Some Days    Packs/day: 0.50    Types: Cigarettes   Smokeless tobacco: Never  Vaping Use   Vaping Use: Never used  Substance and Sexual Activity   Alcohol use:  Yes    Comment: occasionally   Drug use: Not Currently   Sexual activity: Yes  Other Topics Concern   Not on file  Social History Narrative   Lives at home with her children.   Right-handed.   1 to 1.5 cups caffeine per day.   Social Determinants of Health   Financial Resource Strain: Not on file  Food Insecurity: No Food Insecurity (07/03/2019)   Hunger Vital Sign    Worried About Running Out of Food in the Last Year: Never true    Ran Out of Food in the Last Year: Never true  Transportation Needs: No Transportation Needs (07/03/2019)   PRAPARE - Hydrologist (Medical): No    Lack  of Transportation (Non-Medical): No  Physical Activity: Not on file  Stress: Not on file  Social Connections: Not on file  Intimate Partner Violence: Not on file   Family History  Problem Relation Age of Onset   Alcohol abuse Mother    Seizures Father    Alcohol abuse Father            Vanessa Kick, MD 10/13/21 1012

## 2021-10-15 LAB — CULTURE, GROUP A STREP (THRC)

## 2022-01-06 ENCOUNTER — Encounter: Payer: Self-pay | Admitting: Neurology

## 2022-01-06 ENCOUNTER — Emergency Department (HOSPITAL_COMMUNITY): Payer: Medicare HMO

## 2022-01-06 ENCOUNTER — Emergency Department (HOSPITAL_COMMUNITY)
Admission: EM | Admit: 2022-01-06 | Discharge: 2022-01-07 | Discharge status: Against Medical Advice | Payer: Medicare HMO | Attending: Emergency Medicine | Admitting: Emergency Medicine

## 2022-01-06 ENCOUNTER — Ambulatory Visit (INDEPENDENT_AMBULATORY_CARE_PROVIDER_SITE_OTHER): Payer: Medicare HMO | Admitting: Neurology

## 2022-01-06 VITALS — BP 117/74 | HR 70 | Ht 63.0 in | Wt 168.0 lb

## 2022-01-06 DIAGNOSIS — R4182 Altered mental status, unspecified: Secondary | ICD-10-CM | POA: Insufficient documentation

## 2022-01-06 DIAGNOSIS — Z720 Tobacco use: Secondary | ICD-10-CM | POA: Insufficient documentation

## 2022-01-06 DIAGNOSIS — Z5321 Procedure and treatment not carried out due to patient leaving prior to being seen by health care provider: Secondary | ICD-10-CM | POA: Diagnosis not present

## 2022-01-06 DIAGNOSIS — G43009 Migraine without aura, not intractable, without status migrainosus: Secondary | ICD-10-CM | POA: Diagnosis not present

## 2022-01-06 DIAGNOSIS — R42 Dizziness and giddiness: Secondary | ICD-10-CM | POA: Diagnosis not present

## 2022-01-06 DIAGNOSIS — A6000 Herpesviral infection of urogenital system, unspecified: Secondary | ICD-10-CM | POA: Insufficient documentation

## 2022-01-06 LAB — CBC WITH DIFFERENTIAL/PLATELET
Abs Immature Granulocytes: 0.03 10*3/uL (ref 0.00–0.07)
Basophils Absolute: 0 10*3/uL (ref 0.0–0.1)
Basophils Relative: 0 %
Eosinophils Absolute: 0 10*3/uL (ref 0.0–0.5)
Eosinophils Relative: 0 %
HCT: 40.5 % (ref 36.0–46.0)
Hemoglobin: 13.3 g/dL (ref 12.0–15.0)
Immature Granulocytes: 0 %
Lymphocytes Relative: 25 %
Lymphs Abs: 2.8 10*3/uL (ref 0.7–4.0)
MCH: 28.9 pg (ref 26.0–34.0)
MCHC: 32.8 g/dL (ref 30.0–36.0)
MCV: 87.9 fL (ref 80.0–100.0)
Monocytes Absolute: 0.6 10*3/uL (ref 0.1–1.0)
Monocytes Relative: 5 %
Neutro Abs: 7.8 10*3/uL — ABNORMAL HIGH (ref 1.7–7.7)
Neutrophils Relative %: 70 %
Platelets: 369 10*3/uL (ref 150–400)
RBC: 4.61 MIL/uL (ref 3.87–5.11)
RDW: 13.1 % (ref 11.5–15.5)
WBC: 11.3 10*3/uL — ABNORMAL HIGH (ref 4.0–10.5)
nRBC: 0 % (ref 0.0–0.2)

## 2022-01-06 LAB — RAPID URINE DRUG SCREEN, HOSP PERFORMED
Amphetamines: NOT DETECTED
Barbiturates: NOT DETECTED
Benzodiazepines: NOT DETECTED
Cocaine: NOT DETECTED
Opiates: NOT DETECTED
Tetrahydrocannabinol: NOT DETECTED

## 2022-01-06 LAB — URINALYSIS, ROUTINE W REFLEX MICROSCOPIC
Bilirubin Urine: NEGATIVE
Glucose, UA: NEGATIVE mg/dL
Hgb urine dipstick: NEGATIVE
Ketones, ur: NEGATIVE mg/dL
Nitrite: NEGATIVE
Protein, ur: NEGATIVE mg/dL
Specific Gravity, Urine: 1.03 (ref 1.005–1.030)
pH: 5 (ref 5.0–8.0)

## 2022-01-06 LAB — COMPREHENSIVE METABOLIC PANEL
ALT: 17 U/L (ref 0–44)
AST: 21 U/L (ref 15–41)
Albumin: 3.9 g/dL (ref 3.5–5.0)
Alkaline Phosphatase: 56 U/L (ref 38–126)
Anion gap: 7 (ref 5–15)
BUN: 9 mg/dL (ref 6–20)
CO2: 25 mmol/L (ref 22–32)
Calcium: 9 mg/dL (ref 8.9–10.3)
Chloride: 105 mmol/L (ref 98–111)
Creatinine, Ser: 0.83 mg/dL (ref 0.44–1.00)
GFR, Estimated: >60 mL/min (ref >60)
Glucose, Bld: 144 mg/dL — ABNORMAL HIGH (ref 70–99)
Potassium: 4 mmol/L (ref 3.5–5.1)
Sodium: 137 mmol/L (ref 135–145)
Total Bilirubin: 0.4 mg/dL (ref 0.3–1.2)
Total Protein: 7.3 g/dL (ref 6.5–8.1)

## 2022-01-06 LAB — I-STAT BETA HCG BLOOD, ED (MC, WL, AP ONLY): I-stat hCG, quantitative: <5 m[IU]/mL (ref <5)

## 2022-01-06 MED ORDER — VENLAFAXINE HCL ER 37.5 MG PO CP24: 37.5000 mg | ORAL_CAPSULE | Freq: Every day | ORAL | 5 refills | Status: DC

## 2022-01-06 MED ORDER — RIZATRIPTAN BENZOATE 10 MG PO TABS: 10.0000 mg | ORAL_TABLET | ORAL | 5 refills | Status: AC | PRN

## 2022-01-06 MED ORDER — MECLIZINE HCL 25 MG PO TABS: 25.0000 mg | ORAL_TABLET | Freq: Once | ORAL | 0 refills | Status: DC | PRN

## 2022-01-06 NOTE — ED Triage Notes (Incomplete)
Patient here for persistent dizziness

## 2022-01-06 NOTE — Patient Instructions (Signed)
Start Effexor Xr 37.5 mg at bedtime for headache prevention  Use Maxalt as needed for acute headache, try meclizine for dizziness  See you back in 6 months

## 2022-01-06 NOTE — Progress Notes (Signed)
PATIENT: [name] [name] DOB: 1986-01-12  REASON FOR VISIT: follow up HISTORY FROM: patient, significant other PRIMARY NEUROLOGIST: Terrace Arabia  HISTORY [name] [name] is a 36 year old female, seen in refer by ENT Dr. Billy Fischer for evaluation of headaches, primary care physician is Dr. Midge Aver, [facility], initial evaluation was on January 26, 2017.   I reviewed and summarized the referring note, she has past medical history of depression, fetal alcohol syndrome.   She reported history of migraine headaches since 2011, her typical migraine are lateralized severe pounding headache with associated light noise, smell sensitivity, lasting for a few hours to 1 day.   She reported increased frequency of migraine headaches since 2018, can happen every other day, took sumatriptan 3-4 hours to help her headaches.   Trigger for her migraines are stress, bright light, strong smells,   Since 2018, she also noticed intermittent left facial, arm numbness during intense headaches,   We have personally reviewed CT head without contrast in October 2017 there was no significant abnormality.   UPDATE April 25 2017: MRI brain on April 23 2017 was normal.    She has been taking nortriptyline 10 mg up to 2 tablets every night as migraine prevention, Maxalt as needed was helpful, she rarely has migraine.   UPDATE Nov 20 2018: She presented to the emergency room November 19, 2018 for shortness of breath, there was a kerosene heater left inside the home of her mother, she came to the house and was overcome by fumes.  She felt weakness, headache, shortness of breath, laboratory evaluation showed elevated carboxyhemoglobin 5.1, normal BMP, WBC was 15.9, negative HIV, RPR,   She now complains of holoacranial headache, generalized weakness, previous nortriptyline works well for her headache, Maxalt was helpful, but she has run out of prescription   Update May 23, 2019 SS: Here today for follow-up,  unaccompanied for migraine headache.  She has not been taking nortriptyline consistently, says when outside, it makes her skin burn.  Unclear, if it was beneficial for headache.  She has been taking Maxalt with good benefit. On average she is having 2-4 migraines a week.  Reportedly she reports increased stress, her mom passed away earlier this year, she has 4 kids, she does not work, feels overwhelmed.  She recently had a new Nexplanon inserted.  Goes to the health department, has been told she is overweight.   Laboratory evaluation showed elevated CPK 261, TSH was normal  Update November 28, 2019 SS: Went to urgent care 10/27 for dizziness, pain in the left ear, recommended Flonase, Zyrtec, given 5 days of prednisone.  CT head 11/11/19, showed no acute intracranial abnormalities  Report dizziness for the last month, reports migraines once a week, never started Effexor, worried about" health risk" side effects.  Takes Maxalt as needed.  With dizziness, does describe frontal head pain, dizziness comes and goes, has to wear "tissue" in her left ear to control the dizziness, worse when she looks up, triggered by smells.  Saw PCP, thinks she was given Zofran or meclizine with benefit.  Has some left ear pain, went to urgent care, never took prescribed medications.  Seeing ENT 12/12/19.  Under a lot of stress. Otherwise is baseline, no weakness.  Reported history of vertigo, went away.   Update July 14, 2020 SS: Has 4 kids, her mother passed away. Reports can't lay on her right side, makes her dizzy. Migraines are triggered by coffee, onions, certain foods. Reports 4 migraines weekly, related to  her kids being out for summer. Has Nexplanon implant. 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 Maxalt works well.   Update January 06, 2022 SS: Here with her with her boyfriend, still having migraines with dizziness. Experiencing spell today. Has been having frequently due to  weather change. Was given Topamax in June 2022, she thinks it helped. Maxalt helped, she has been out of it.  Didn't eat today, given water and pretzels. She is acting unusual today, but boyfriend reports this is her typical spell.   REVIEW OF SYSTEMS: Out of a complete 14 system review of symptoms, the patient complains only of the following symptoms, and all other reviewed systems are negative.  See HPI  ALLERGIES: Allergies  Allergen Reactions   Septra [Sulfamethoxazole-Trimethoprim] Hives    legs    HOME MEDICATIONS: Outpatient Medications Prior to Visit  Medication Sig Dispense Refill   acetaminophen (TYLENOL) 500 MG tablet Take 1 tablet (500 mg total) by mouth every 6 (six) hours as needed. 30 tablet 0   albuterol (VENTOLIN HFA) 108 (90 Base) MCG/ACT inhaler Inhale 2 puffs into the lungs every 4 (four) hours as needed for wheezing or shortness of breath. 8 g 0   fluticasone (FLONASE) 50 MCG/ACT nasal spray Place 1 spray into both nostrils daily. 16 g 2   Prenatal Vit-Fe Fumarate-FA (PRENATAL MULTIVITAMIN) TABS tablet Take 1 tablet by mouth daily at 12 noon.     promethazine-dextromethorphan (PROMETHAZINE-DM) 6.25-15 MG/5ML syrup Take 5 mLs by mouth 4 (four) times daily as needed for cough. 118 mL 0   Facility-Administered Medications Prior to Visit  Medication Dose Route Frequency Provider Last Rate Last Admin   rho(d) immune globulin (RHIG/Rhophylac) injection 300 mcg  300 mcg Intramuscular Once Kathreen Cosier, MD        PAST MEDICAL HISTORY: 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   History of chlamydia    Migraine     PAST SURGICAL HISTORY: Past Surgical History:  Procedure Laterality Date   APPENDECTOMY     DILATION AND EVACUATION N/A 07/02/2021   Procedure: DILATATION AND EVACUATION CHROMASOMAL STUDIES;  Surgeon: Carlisle Cater, MD;  Location: [facility];  Service: Gynecology;  Laterality: N/A;   EYE SURGERY     TYMPANOSTOMY TUBE PLACEMENT     WISDOM TOOTH EXTRACTION      FAMILY HISTORY: Family History  Problem Relation Age of Onset   Alcohol abuse Mother    Seizures Father    Alcohol abuse Father     SOCIAL HISTORY: Social History   Socioeconomic History   Marital status: Single    Spouse name: Not on file   Number of children: 4   Years of education: 1 year college   Highest education level: Not on file  Occupational History   Occupation: Unemployed  Tobacco Use   Smoking status: Some Days    Packs/day: 0.50    Types: Cigarettes   Smokeless tobacco: Never  Vaping Use   Vaping Use: Never used  Substance and Sexual Activity   Alcohol use: Yes    Comment: occasionally   Drug use: Not Currently   Sexual activity: Yes  Other Topics Concern   Not on file  Social History Narrative   Lives at home with her children.   Right-handed.   1 to 1.5 cups caffeine per day.  Social Determinants of Health   Financial Resource Strain: Not on file  Food Insecurity: No Food Insecurity (07/03/2019)   Hunger Vital Sign    Worried About Running Out of Food in the Last Year: Never true    Ran Out of Food in the Last Year: Never true  Transportation Needs: No Transportation Needs (07/03/2019)   PRAPARE - Administrator, Civil ServiceTransportation    Lack of Transportation (Medical): No    Lack of Transportation (Non-Medical): No  Physical Activity: Not on file  Stress: Not on file  Social Connections: Not on file  Intimate Partner Violence: Not on file   PHYSICAL EXAM  Vitals:   01/06/22 0942  BP: 117/74  Pulse: 70  Weight: 168 lb (76.2 kg)  Height: 5\' 3"  (1.6 m)   Body mass index is 29.76 kg/m.  Generalized: Well developed, in no acute distress, disheveled Neurological examination  Mentation: Alert oriented to time, place, history taking. Seems to have some trouble following commands, asks me how do I do that, makes jokes Cranial nerve  II-XII: Pupils were equal round reactive to light, but left eye is smaller than right (reported baseline).  Extraocular movements were full, visual field were full on confrontational test.  No nystagmus.  Facial sensation and strength were normal.  Head turning and shoulder shrug  were normal and symmetric. Motor: There is no localized weakness, but she does not give much effort during exam Sensory: Sensory testing is intact to soft touch on all 4 extremities. No evidence of extinction is noted.  Coordination: Cerebellar testing reveals good finger-nose-finger and heel-to-shin bilaterally.  Performs these, but they are slow and thoughtful. Gait and station: Gait is normal, but cautious, slow. Reflexes: Deep tendon reflexes are symmetric and normal bilaterally.   DIAGNOSTIC DATA (LABS, IMAGING, TESTING) - I reviewed patient records, labs, notes, testing and imaging myself where available.  Lab Results  Component Value Date   WBC 10.6 (H) 05/28/2021   HGB 12.2 05/28/2021   HCT 37.1 05/28/2021   MCV 87.1 05/28/2021   PLT 366 05/28/2021      Component Value Date/Time   NA 135 01/26/2021 1330   K 3.6 01/26/2021 1330   CL 105 01/26/2021 1330   CO2 24 01/26/2021 1330   GLUCOSE 103 (H) 01/26/2021 1330   BUN 9 01/26/2021 1330   CREATININE 0.87 01/26/2021 1330   CALCIUM 8.6 (L) 01/26/2021 1330   PROT 7.3 01/26/2021 1330   ALBUMIN 3.7 01/26/2021 1330   AST 18 01/26/2021 1330   ALT 18 01/26/2021 1330   ALKPHOS 65 01/26/2021 1330   BILITOT 0.7 01/26/2021 1330   GFRNONAA >60 01/26/2021 1330   GFRAA >60 11/19/2018 2230   No results found for: "CHOL", "HDL", "LDLCALC", "LDLDIRECT", "TRIG", "CHOLHDL" No results found for: "HGBA1C" No results found for: "VITAMINB12" Lab Results  Component Value Date   TSH 1.770 11/20/2018    ASSESSMENT AND PLAN 36 y.o. year old female  has a past medical history of Depression, Fetal alcohol syndrome, History of chlamydia, and Migraine. here with:  1.   Chronic migraine headaches, associated left hemiparesthesia 2.  Fetal alcohol syndrome 3.  Kerosene fume exposure October 2020 4.  Reported dizziness  -Having headache with dizziness today -Start Effexor XR 37.5 mg at bedtime for migraine prevention -Refilled Maxalt 10 mg as needed for acute headache, given meclizine 25 mg as needed for dizziness -Did not restart Topamax, not on birth control, miscarriage in summer 2023 -Previously tried and failed: Nortriptyline -For current spell, significant  other reports this is baseline, if any thing worsens or becomes different than normal go to the ER, return here in 6 months  Margie Ege, AGNP-C, DNP 01/06/2022, 10:07 AM [facility] [address] [place], [place] [address] [phone]

## 2022-01-06 NOTE — ED Provider Triage Note (Signed)
Emergency Medicine Provider Triage Evaluation Note  [name] [name] , a 36 y.o. female  was evaluated in triage.  Pt complains of dizziness.  Patient was valuated at PCP office and referred to the emergency room.  Patient during the interview has periods of confusion and is slow to respond.  Denies any other complaints.  The friend that is at bedside states this happens intermittently but it is not normal for all the time.  She is slow to follow commands.  No focal weakness.  PCP office witnessed this and referred her to the emergency room.  She has history of vertigo..  Review of Systems  Positive: As above Negative: As above  Physical Exam  LMP 04/18/2021 (Approximate)  Gen:   Awake, no distress   Resp:  Normal effort  MSK:   Moves extremities without difficulty  Other:  Moves bilateral upper and lower extremities well.  Symmetrical strength.  She is oriented to person, place, month.  Unable to tell me the year.  Medical Decision Making  Medically screening exam initiated at 12:08 PM.  Appropriate orders placed.  GRENDA LORA was informed that the remainder of the evaluation will be completed by another provider, this initial triage assessment does not replace that evaluation, and the importance of remaining in the ED until their evaluation is complete.     Marita Kansas, PA-C 01/06/22 1210

## 2022-01-07 NOTE — ED Notes (Signed)
Pt called for vitals recheck, then a little while later was called for a room, no response for either. Consulting civil engineer moved [employer].

## 2022-01-24 NOTE — L&D Delivery Note (Addendum)
DELIVERY NOTE  Pt called out involuntarily pushing and found to be crowning. Pt pushed and delivered a viable female infant in ROA position. Anterior and posterior shoulders spontaneously delivered with next two pushes; body easily followed next. Infant placed on mothers abdomen and bulb suction of mouth and nose performed. Cord was then clamped and cut by FOB. Cord blood obtained, 3VC, thin vessels and unable to draw cord blood. Baby had a vigorous spontaneous cry noted. Placenta then delivered at 1832 intact. Fundal massage performed and pitocin per protocol. Fundus firm. The following lacerations were noted: NONE, EBL 150. Mother and baby stable. Counts correct   Infant time: 19 Gender: female Placenta time: 1832 Apgars: 8/9 Weight: pending skin-to-skin  Dicussed with patient R/B/A of postpartum bilateral tubal ligation. Alternatives includes internal ligation, LARCs. Discussed increased risk of ectopic pregnancy after procedure (1 in 3 pregnancies post-ligation can be ectopic) and that a pregnancy test should be taken if suspicion arises postpartum. Discussed risk of failure 1/100, backup method should be used for 2-4wks. Discussed R/B or surgical procedure including bleeding, infection, injury to surrounding organs. Patient agrees to blood products if needed. Discussed that BTL does not protect against STI and that barrier protection should be used. Also discussed with patient outpatient bilateral salpingectomy to remove the whole tube in order to reduce accidental pregnancy rate based on recent literature. Spoke with both patient and her neighbor on the phone (will be her ride). Amenable to this, will hold ppBTL and plan outpatient. Routine pp orders at this time

## 2022-01-28 ENCOUNTER — Other Ambulatory Visit: Payer: Self-pay | Admitting: Neurology

## 2022-02-04 ENCOUNTER — Other Ambulatory Visit: Payer: Self-pay | Admitting: Neurology

## 2022-04-11 ENCOUNTER — Inpatient Hospital Stay (HOSPITAL_COMMUNITY): Payer: Medicare HMO

## 2022-04-11 ENCOUNTER — Inpatient Hospital Stay (HOSPITAL_COMMUNITY)
Admission: AD | Admit: 2022-04-11 | Discharge: 2022-04-11 | Disposition: A | Discharge status: Home / Self Care | Payer: Medicare HMO | Attending: Obstetrics | Admitting: Obstetrics

## 2022-04-11 ENCOUNTER — Encounter (HOSPITAL_COMMUNITY): Payer: Self-pay | Admitting: *Deleted

## 2022-04-11 DIAGNOSIS — Z3A12 12 weeks gestation of pregnancy: Secondary | ICD-10-CM | POA: Diagnosis not present

## 2022-04-11 DIAGNOSIS — O09521 Supervision of elderly multigravida, first trimester: Secondary | ICD-10-CM | POA: Diagnosis not present

## 2022-04-11 DIAGNOSIS — O99611 Diseases of the digestive system complicating pregnancy, first trimester: Secondary | ICD-10-CM | POA: Insufficient documentation

## 2022-04-11 DIAGNOSIS — R1030 Lower abdominal pain, unspecified: Secondary | ICD-10-CM | POA: Diagnosis not present

## 2022-04-11 DIAGNOSIS — O219 Vomiting of pregnancy, unspecified: Secondary | ICD-10-CM

## 2022-04-11 DIAGNOSIS — K5900 Constipation, unspecified: Secondary | ICD-10-CM

## 2022-04-11 DIAGNOSIS — O99281 Endocrine, nutritional and metabolic diseases complicating pregnancy, first trimester: Secondary | ICD-10-CM | POA: Insufficient documentation

## 2022-04-11 DIAGNOSIS — O26891 Other specified pregnancy related conditions, first trimester: Secondary | ICD-10-CM | POA: Diagnosis present

## 2022-04-11 DIAGNOSIS — E860 Dehydration: Secondary | ICD-10-CM | POA: Diagnosis not present

## 2022-04-11 DIAGNOSIS — O99612 Diseases of the digestive system complicating pregnancy, second trimester: Secondary | ICD-10-CM

## 2022-04-11 LAB — CBC
HCT: 36.9 % (ref 36.0–46.0)
Hemoglobin: 12.4 g/dL (ref 12.0–15.0)
MCH: 28.6 pg (ref 26.0–34.0)
MCHC: 33.6 g/dL (ref 30.0–36.0)
MCV: 85 fL (ref 80.0–100.0)
Platelets: 375 10*3/uL (ref 150–400)
RBC: 4.34 MIL/uL (ref 3.87–5.11)
RDW: 12.7 % (ref 11.5–15.5)
WBC: 13.6 10*3/uL — ABNORMAL HIGH (ref 4.0–10.5)
nRBC: 0 % (ref 0.0–0.2)

## 2022-04-11 LAB — WET PREP, GENITAL
Clue Cells Wet Prep HPF POC: NONE SEEN
Sperm: NONE SEEN
Trich, Wet Prep: NONE SEEN
WBC, Wet Prep HPF POC: <10 (ref <10)
Yeast Wet Prep HPF POC: NONE SEEN

## 2022-04-11 LAB — POCT PREGNANCY, URINE: Preg Test, Ur: POSITIVE — AB

## 2022-04-11 LAB — ABO/RH
ABO/RH(D): O NEG
Antibody Screen: NEGATIVE

## 2022-04-11 LAB — URINALYSIS, ROUTINE W REFLEX MICROSCOPIC
Bilirubin Urine: NEGATIVE
Glucose, UA: NEGATIVE mg/dL
Hgb urine dipstick: NEGATIVE
Ketones, ur: 20 mg/dL — AB
Nitrite: NEGATIVE
Protein, ur: NEGATIVE mg/dL
Specific Gravity, Urine: 1.014 (ref 1.005–1.030)
pH: 6 (ref 5.0–8.0)

## 2022-04-11 LAB — HCG, QUANTITATIVE, PREGNANCY: hCG, Beta Chain, Quant, S: 54995 m[IU]/mL — ABNORMAL HIGH (ref <5)

## 2022-04-11 MED ORDER — POLYETHYLENE GLYCOL 3350 17 GM/SCOOP PO POWD: 17.0000 g | Freq: Every day | ORAL | 0 refills | Status: AC

## 2022-04-11 MED ORDER — PROMETHAZINE HCL 12.5 MG PO TABS: 12.5000 mg | ORAL_TABLET | Freq: Four times a day (QID) | ORAL | 0 refills | Status: DC | PRN

## 2022-04-11 NOTE — MAU Note (Addendum)
Says 02-21-2022- HPT- positive.  Then Feb with Family Dr - confirmed preg  Pt says she called Dr Wilhelmenia Blase office today [place] has been cramping since Thursday - no meds . No VB Last sex- today  Also says she is nauseated- Family Dr gave her meds - a white pill- took on Sat

## 2022-04-11 NOTE — MAU Provider Note (Addendum)
History     CSN: [id number]  Arrival date and time: 04/11/22 1817   Event Date/Time   First Provider Initiated Contact with Patient 04/11/22 2002      Chief Complaint  Patient presents with   Abdominal Pain   [name] [name] , a  37 y.o. [id number] at [redacted]w[redacted]d presents to MAU with complaints of lower abdominal pain since Thursday. She describes pain as "pressure and occasional cramping like an empty stomach." She is unknown if she is constipated. Last BM was yesterday but she states that it was hard and small. She denies attempting to relieve pain with medication. She states there are no worsening or alleviating symptoms. She currently rates pain a 8/10, but denies wanting pain medication. She denies having an appetite.  She denies having a fever. Patient states she has been fatigued and nauseated. She states she doesn't feel rested when she does sleep. She denies vaginal bleeding, and abnormal vaginal discharge.           OB History     Gravida  6   Para  4   Term  4   Preterm  0   AB  0   Living  4      SAB  0   IAB  0   Ectopic  0   Multiple  0   Live Births  4           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   History of chlamydia    Migraine     Past Surgical History:  Procedure Laterality Date   APPENDECTOMY     DILATION AND EVACUATION N/A 07/02/2021   Procedure: DILATATION AND EVACUATION CHROMASOMAL STUDIES;  Surgeon: Deliah Boston, MD;  Location: [facility];  Service: Gynecology;  Laterality: N/A;   EYE SURGERY     TYMPANOSTOMY TUBE PLACEMENT     WISDOM TOOTH EXTRACTION      Family History  Problem Relation Age of Onset   Alcohol abuse Mother    Seizures Father    Alcohol abuse Father     Social History   Tobacco Use   Smoking status: Some Days    Packs/day: .5    Types: Cigarettes   Smokeless tobacco: Never  Vaping Use    Vaping Use: Never used  Substance Use Topics   Alcohol use: Yes    Comment: occasionally   Drug use: Not Currently    Allergies:  Allergies  Allergen Reactions   Septra [Sulfamethoxazole-Trimethoprim] Hives    legs    No medications prior to admission.    Review of Systems  Constitutional:  Positive for appetite change and fatigue. Negative for chills and fever.  Eyes:  Negative for pain and visual disturbance.  Respiratory:  Negative for apnea, shortness of breath and wheezing.   Cardiovascular:  Negative for chest pain and palpitations.  Gastrointestinal:  Positive for abdominal pain, constipation, nausea and vomiting. Negative for diarrhea.  Genitourinary:  Negative for difficulty urinating, dysuria, pelvic pain, vaginal bleeding, vaginal discharge and vaginal pain.  Musculoskeletal:  Negative for back pain.  Neurological:  Positive for weakness. Negative for seizures and headaches.  Psychiatric/Behavioral:  Negative for suicidal ideas.    Physical Exam   Blood pressure (!) 104/59, pulse 70, temperature 98.7 F (37.1 C), temperature source Oral, resp. rate 16, height 5\' 3"  (1.6 m),  weight 73.2 kg, last menstrual period 02/13/2022, unknown if currently breastfeeding.  Physical Exam Vitals and nursing note reviewed.  Constitutional:      General: She is not in acute distress.    Appearance: Normal appearance.  HENT:     Head: Normocephalic.  Pulmonary:     Effort: Pulmonary effort is normal.  Abdominal:     Tenderness: There is guarding.  Musculoskeletal:     Cervical back: Normal range of motion.  Skin:    General: Skin is warm and dry.     Capillary Refill: Capillary refill takes 2 to 3 seconds.  Neurological:     Mental Status: She is alert and oriented to person, place, and time.  Psychiatric:        Mood and Affect: Mood normal.     MAU Course  Procedures Orders Placed This Encounter  Procedures   Wet prep, genital   Culture, OB Urine   US OB  Comp Less 14 Wks   CBC   hCG, quantitative, pregnancy   Urinalysis, Routine w reflex microscopic -Urine, Clean Catch   Pregnancy, urine POC   ABO/Rh   Discharge patient   Results for orders placed or performed during the hospital encounter of 04/11/22 (from the past 24 hour(s))  GC/Chlamydia probe amp ([place])not at [facility]     Status: None   Collection Time: 04/11/22  7:17 PM  Result Value Ref Range   Neisseria Gonorrhea Negative    Chlamydia Negative    Comment Normal Reference [place] Chlamydia - Negative    Comment      Normal Reference Range Neisseria Gonorrhea - Negative  CBC     Status: Abnormal   Collection Time: 04/11/22  7:49 PM  Result Value Ref Range   WBC 13.6 (H) 4.0 - 10.5 K/uL   RBC 4.34 3.87 - 5.11 MIL/uL   Hemoglobin 12.4 12.0 - 15.0 g/dL   HCT 36.9 36.0 - 46.0 %   MCV 85.0 80.0 - 100.0 fL   MCH 28.6 26.0 - 34.0 pg   MCHC 33.6 30.0 - 36.0 g/dL   RDW 12.7 11.5 - 15.5 %   Platelets 375 150 - 400 K/uL   nRBC 0.0 0.0 - 0.2 %  hCG, quantitative, pregnancy     Status: Abnormal   Collection Time: 04/11/22  7:49 PM  Result Value Ref Range   hCG, Beta Chain, Quant, S 54,995 (H) <5 mIU/mL  Wet prep, genital     Status: None   Collection Time: 04/11/22  7:49 PM   Specimen: PATH Cytology Cervicovaginal Ancillary Only  Result Value Ref Range   Yeast Wet Prep HPF POC NONE SEEN NONE SEEN   Trich, Wet Prep NONE SEEN NONE SEEN   Clue Cells Wet Prep HPF POC NONE SEEN NONE SEEN   WBC, Wet Prep HPF POC <10 <10   Sperm NONE SEEN   Urinalysis, Routine w reflex microscopic -Urine, Clean Catch     Status: Abnormal   Collection Time: 04/11/22  7:49 PM  Result Value Ref Range   Color, Urine YELLOW YELLOW   APPearance HAZY (A) CLEAR   Specific Gravity, Urine 1.014 1.005 - 1.030   pH 6.0 5.0 - 8.0   Glucose, UA NEGATIVE NEGATIVE mg/dL   Hgb urine dipstick NEGATIVE NEGATIVE   Bilirubin Urine NEGATIVE NEGATIVE   Ketones, ur 20 (A) NEGATIVE mg/dL   Protein, ur NEGATIVE  NEGATIVE mg/dL   Nitrite NEGATIVE NEGATIVE   Leukocytes,Ua SMALL (A) NEGATIVE  RBC / HPF 0-5 0 - 5 RBC/hpf   WBC, UA 0-5 0 - 5 WBC/hpf   Bacteria, UA RARE (A) NONE SEEN   Squamous Epithelial / HPF 6-10 0 - 5 /HPF   Mucus PRESENT   Pregnancy, urine POC     Status: Abnormal   Collection Time: 04/11/22  7:50 PM  Result Value Ref Range   Preg Test, Ur POSITIVE (A) NEGATIVE  ABO/Rh     Status: None   Collection Time: 04/11/22  7:52 PM  Result Value Ref Range   ABO/RH(D) O NEG    Antibody Screen      NEG Performed at [facility] [address] [place], [place] [address]    [place] Comp Less 14 Wks  Result Date: 04/11/2022 CLINICAL DATA:  Pregnant patient with pain and cramping. Gestational age by LMP 8 weeks 1 day EXAM: OBSTETRIC <14 WK ULTRASOUND TECHNIQUE: Transabdominal ultrasound was performed for evaluation of the gestation as well as the maternal uterus and adnexal regions. COMPARISON:  None available this pregnancy. FINDINGS: Intrauterine gestational sac: Single Yolk sac:  Not Visualized, likely normal for gestational age. Embryo:  Visualized. Cardiac Activity: Visualized. Heart Rate: 161 bpm CRL:   54.3 mm   12 w 0 d                  [place] EDC: 10/24/2022 Subchorionic hemorrhage:  None visualized. Maternal uterus/adnexae: Both ovaries are visualized and are normal. No adnexal mass. No pelvic free fluid. IMPRESSION: Single live intrauterine pregnancy estimated gestational age based on crown-rump length 12 weeks 0 days for ultrasound [facility] 10/24/2022. Electronically Signed   By: Keith Rake M.D.   On: 04/11/2022 20:43    MDM - Wet prep normal  - initial suspicion for ectopic pregnancy. - Quant and Pelvic US ordered.   - Images reviewed by Me. Single living IUP measuring approximated [redacted]w[redacted]d. - Low suspicion for ectopic pregnancy  - UA noted for 20 of Ketones, small Leuks with Rare bacteria. Possible UTI, reflexed to culture.  - CBC- white count elevated, Otherwise normal.  - VS  normal, no tenderness to palpation, low suspicion for appendicitis.  - Discomfort likely caused by constipation.  - Plan for discharge   Assessment and Plan   1. Constipation during pregnancy in second trimester   2. Nausea and vomiting during pregnancy   3. Mild dehydration   4. [redacted] weeks gestation of pregnancy    - Reviewed that constipation can cause abdominal pain in pregnancy.  - Recommended a stool softener and or the use of miralax.  - Encouraged to increase oral intake of fluids to also help improve gut motility.  - Worsening signs and return precautions reviewed with patient.  - Patient discharged ome in stable condition and may return to MAU as needed.   Jacquiline Doe, MSN CNM  04/12/2022, 3:29 PM

## 2022-04-11 NOTE — Discharge Instructions (Signed)

## 2022-04-12 LAB — CULTURE, OB URINE: Culture: <10000 — AB

## 2022-04-12 LAB — GC/CHLAMYDIA PROBE AMP (~~LOC~~) NOT AT ARMC
Chlamydia: NEGATIVE
Comment: NEGATIVE
Comment: NORMAL
Neisseria Gonorrhea: NEGATIVE

## 2022-07-11 ENCOUNTER — Other Ambulatory Visit (HOSPITAL_COMMUNITY): Payer: Self-pay

## 2022-08-09 ENCOUNTER — Encounter: Payer: Self-pay | Admitting: Neurology

## 2022-08-09 ENCOUNTER — Ambulatory Visit: Payer: Medicare HMO | Admitting: Neurology

## 2022-08-09 NOTE — Progress Notes (Deleted)
PATIENT: [name] [name] DOB: 1985/03/06  REASON FOR VISIT: follow up HISTORY FROM: patient, significant other PRIMARY NEUROLOGIST: Terrace Arabia  HISTORY [name] [name] STEPFANIE [name] is a 37 year old female, seen in refer by ENT Dr. Billy Fischer for evaluation of headaches, primary care physician is Dr. Midge Aver, [facility], initial evaluation was on January 26, 2017.   I reviewed and summarized the referring note, she has past medical history of depression, fetal alcohol syndrome.   She reported history of migraine headaches since 2011, her typical migraine are lateralized severe pounding headache with associated light noise, smell sensitivity, lasting for a few hours to 1 day.   She reported increased frequency of migraine headaches since 2018, can happen every other day, took sumatriptan 3-4 hours to help her headaches.   Trigger for her migraines are stress, bright light, strong smells,   Since 2018, she also noticed intermittent left facial, arm numbness during intense headaches,   We have personally reviewed CT head without contrast in October 2017 there was no significant abnormality.   UPDATE April 25 2017: MRI brain on April 23 2017 was normal.    She has been taking nortriptyline 10 mg up to 2 tablets every night as migraine prevention, Maxalt as needed was helpful, she rarely has migraine.   UPDATE Nov 20 2018: She presented to the emergency room November 19, 2018 for shortness of breath, there was a kerosene heater left inside the home of her mother, she came to the house and was overcome by fumes.  She felt weakness, headache, shortness of breath, laboratory evaluation showed elevated carboxyhemoglobin 5.1, normal BMP, WBC was 15.9, negative HIV, RPR,   She now complains of holoacranial headache, generalized weakness, previous nortriptyline works well for her headache, Maxalt was helpful, but she has run out of prescription   Update May 23, 2019 SS: Here today for follow-up,  unaccompanied for migraine headache.  She has not been taking nortriptyline consistently, says when outside, it makes her skin burn.  Unclear, if it was beneficial for headache.  She has been taking Maxalt with good benefit. On average she is having 2-4 migraines a week.  Reportedly she reports increased stress, her mom passed away earlier this year, she has 4 kids, she does not work, feels overwhelmed.  She recently had a new Nexplanon inserted.  Goes to the health department, has been told she is overweight.   Laboratory evaluation showed elevated CPK 261, TSH was normal  Update November 28, 2019 SS: Went to urgent care 10/27 for dizziness, pain in the left ear, recommended Flonase, Zyrtec, given 5 days of prednisone.  CT head 11/11/19, showed no acute intracranial abnormalities  Report dizziness for the last month, reports migraines once a week, never started Effexor, worried about" health risk" side effects.  Takes Maxalt as needed.  With dizziness, does describe frontal head pain, dizziness comes and goes, has to wear "tissue" in her left ear to control the dizziness, worse when she looks up, triggered by smells.  Saw PCP, thinks she was given Zofran or meclizine with benefit.  Has some left ear pain, went to urgent care, never took prescribed medications.  Seeing ENT 12/12/19.  Under a lot of stress. Otherwise is baseline, no weakness.  Reported history of vertigo, went away.   Update July 14, 2020 SS: Has 4 kids, her mother passed away. Reports can't lay on her right side, makes her dizzy. Migraines are triggered by coffee, onions, certain foods. Reports 4 migraines weekly, related to  her kids being out for summer. Has Nexplanon implant. 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 Maxalt works well.   Update January 06, 2022 SS: Here with her with her boyfriend, still having migraines with dizziness. Experiencing spell today. Has been having frequently due to  weather change. Was given Topamax in June 2022, she thinks it helped. Maxalt helped, she has been out of it.  Didn't eat today, given water and pretzels. She is acting unusual today, but boyfriend reports this is her typical spell.   Update August 09, 2022 SS:  REVIEW OF SYSTEMS: Out of a complete 14 system review of symptoms, the patient complains only of the following symptoms, and all other reviewed systems are negative.  See HPI  ALLERGIES: Allergies  Allergen Reactions   Septra [Sulfamethoxazole-Trimethoprim] Hives    legs    HOME MEDICATIONS: Outpatient Medications Prior to Visit  Medication Sig Dispense Refill   acetaminophen (TYLENOL) 500 MG tablet Take 1 tablet (500 mg total) by mouth every 6 (six) hours as needed. 30 tablet 0   albuterol (VENTOLIN HFA) 108 (90 Base) MCG/ACT inhaler Inhale 2 puffs into the lungs every 4 (four) hours as needed for wheezing or shortness of breath. 8 g 0   fluticasone (FLONASE) 50 MCG/ACT nasal spray Place 1 spray into both nostrils daily. 16 g 2   meclizine (ANTIVERT) 25 MG tablet TAKE 1 TABLET (25 MG TOTAL) BY MOUTH ONCE AS NEEDED FOR UP TO 1 DOSE FOR DIZZINESS. 10 tablet 0   polyethylene glycol powder (GLYCOLAX/MIRALAX) 17 GM/SCOOP powder Take 17 g by mouth daily. 255 g 0   Prenatal Vit-Fe Fumarate-FA (PRENATAL MULTIVITAMIN) TABS tablet Take 1 tablet by mouth daily at 12 noon.     promethazine (PHENERGAN) 12.5 MG tablet Take 1-2 tablets (12.5-25 mg total) by mouth every 6 (six) hours as needed for nausea or vomiting. 30 tablet 0   promethazine-dextromethorphan (PROMETHAZINE-DM) 6.25-15 MG/5ML syrup Take 5 mLs by mouth 4 (four) times daily as needed for cough. 118 mL 0   rizatriptan (MAXALT) 10 MG tablet Take 1 tablet (10 mg total) by mouth as needed for migraine. May repeat in 2 hours if needed 10 tablet 5   venlafaxine XR (EFFEXOR-XR) 37.5 MG 24 hr capsule TAKE 1 CAPSULE (37.5 MG TOTAL) BY MOUTH AT BEDTIME. 90 capsule 2   Facility-Administered  Medications Prior to Visit  Medication Dose Route Frequency Provider Last Rate Last Admin   rho(d) immune globulin (RHIG/Rhophylac) injection 300 mcg  300 mcg Intramuscular Once Kathreen Cosier, MD        PAST MEDICAL HISTORY: 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   History of chlamydia    Migraine     PAST SURGICAL HISTORY: Past Surgical History:  Procedure Laterality Date   APPENDECTOMY     DILATION AND EVACUATION N/A 07/02/2021   Procedure: DILATATION AND EVACUATION CHROMASOMAL STUDIES;  Surgeon: Carlisle Cater, MD;  Location:  SUR[facility]ERY CENTER;  Service: Gynecology;  Laterality: N/A;   EYE SURGERY     TYMPANOSTOMY TUBE PLACEMENT     WISDOM TOOTH EXTRACTION      FAMILY HISTORY: Family History  Problem Relation Age of Onset   Alcohol abuse Mother    Seizures Father    Alcohol abuse Father     SOCIAL HISTORY: Social History   Socioeconomic History  Marital status: Single    Spouse name: Not on file   Number of children: 4   Years of education: 1 year college   Highest education level: Not on file  Occupational History   Occupation: Unemployed  Tobacco Use   Smoking status: Some Days    Current packs/day: 0.50    Types: Cigarettes   Smokeless tobacco: Never  Vaping Use   Vaping status: Never Used  Substance and Sexual Activity   Alcohol use: Yes    Comment: occasionally   Drug use: Not Currently   Sexual activity: Yes  Other Topics Concern   Not on file  Social History Narrative   Lives at home with her children.   Right-handed.   1 to 1.5 cups caffeine per day.   Social Determinants of Health   Financial Resource Strain: Not on file  Food Insecurity: No Food Insecurity (07/03/2019)   Hunger Vital Sign    Worried About Running Out of Food in the Last Year: Never true    Ran Out of Food in the Last Year: Never true  Transportation Needs: No  Transportation Needs (07/03/2019)   PRAPARE - Administrator, Civil Service (Medical): No    Lack of Transportation (Non-Medical): No  Physical Activity: Not on file  Stress: Not on file  Social Connections: Unknown (05/28/2021)   Received from [facility]   Social Network    Social Network: Not on file  Intimate Partner Violence: Unknown (04/29/2021)   Received from [facility]   HITS    Physically Hurt: Not on file    Insult or Talk Down To: Not on file    Threaten Physical Harm: Not on file    Scream or Curse: Not on file   PHYSICAL EXAM  There were no vitals filed for this visit.  There is no height or weight on file to calculate BMI.  Generalized: Well developed, in no acute distress, disheveled Neurological examination  Mentation: Alert oriented to time, place, history taking. Seems to have some trouble following commands, asks me how do I do that, makes jokes Cranial nerve II-XII: Pupils were equal round reactive to light, but left eye is smaller than right (reported baseline).  Extraocular movements were full, visual field were full on confrontational test.  No nystagmus.  Facial sensation and strength were normal.  Head turning and shoulder shrug  were normal and symmetric. Motor: There is no localized weakness, but she does not give much effort during exam Sensory: Sensory testing is intact to soft touch on all 4 extremities. No evidence of extinction is noted.  Coordination: Cerebellar testing reveals good finger-nose-finger and heel-to-shin bilaterally.  Performs these, but they are slow and thoughtful. Gait and station: Gait is normal, but cautious, slow. Reflexes: Deep tendon reflexes are symmetric and normal bilaterally.   DIAGNOSTIC DATA (LABS, IMAGING, TESTING) - I reviewed patient records, labs, notes, testing and imaging myself where available.  Lab Results  Component Value Date   WBC 13.6 (H) 04/11/2022   HGB 12.4 04/11/2022   HCT 36.9 04/11/2022    MCV 85.0 04/11/2022   PLT 375 04/11/2022      Component Value Date/Time   NA 137 01/06/2022 1239   K 4.0 01/06/2022 1239   CL 105 01/06/2022 1239   CO2 25 01/06/2022 1239   GLUCOSE 144 (H) 01/06/2022 1239   BUN 9 01/06/2022 1239   CREATININE 0.83 01/06/2022 1239   CALCIUM 9.0 01/06/2022 1239   PROT 7.3  01/06/2022 1239   ALBUMIN 3.9 01/06/2022 1239   AST 21 01/06/2022 1239   ALT 17 01/06/2022 1239   ALKPHOS 56 01/06/2022 1239   BILITOT 0.4 01/06/2022 1239   GFRNONAA >60 01/06/2022 1239   GFRAA >60 11/19/2018 2230   No results found for: "CHOL", "HDL", "LDLCALC", "LDLDIRECT", "TRIG", "CHOLHDL" No results found for: "HGBA1C" No results found for: "VITAMINB12" Lab Results  Component Value Date   TSH 1.770 11/20/2018    ASSESSMENT AND PLAN 37 y.o. year old female  has a past medical history of Depression, Fetal alcohol syndrome, History of chlamydia, and Migraine. here with:  1.  Chronic migraine headaches, associated left hemiparesthesia 2.  Fetal alcohol syndrome 3.  Kerosene fume exposure October 2020 4.  Reported dizziness  -Having headache with dizziness today -Start Effexor XR 37.5 mg at bedtime for migraine prevention -Refilled Maxalt 10 mg as needed for acute headache, given meclizine 25 mg as needed for dizziness -Did not restart Topamax, not on birth control, miscarriage in summer 2023 -Previously tried and failed: Nortriptyline -For current spell, significant other reports this is baseline, if any thing worsens or becomes different than normal go to the ER, return here in 6 months  Margie Ege, AGNP-C, DNP 08/09/2022, 5:28 AM [facility] [address] [place], [place] [address] [phone]

## 2022-08-24 IMAGING — DX DG CHEST 2V
2 series · 2 of 2 positions shown · non-contrast
Comparison: September 27, 2019.

CLINICAL DATA: Right upper back pain without known injury.

EXAM:
CHEST - 2 VIEW

[chest pa]
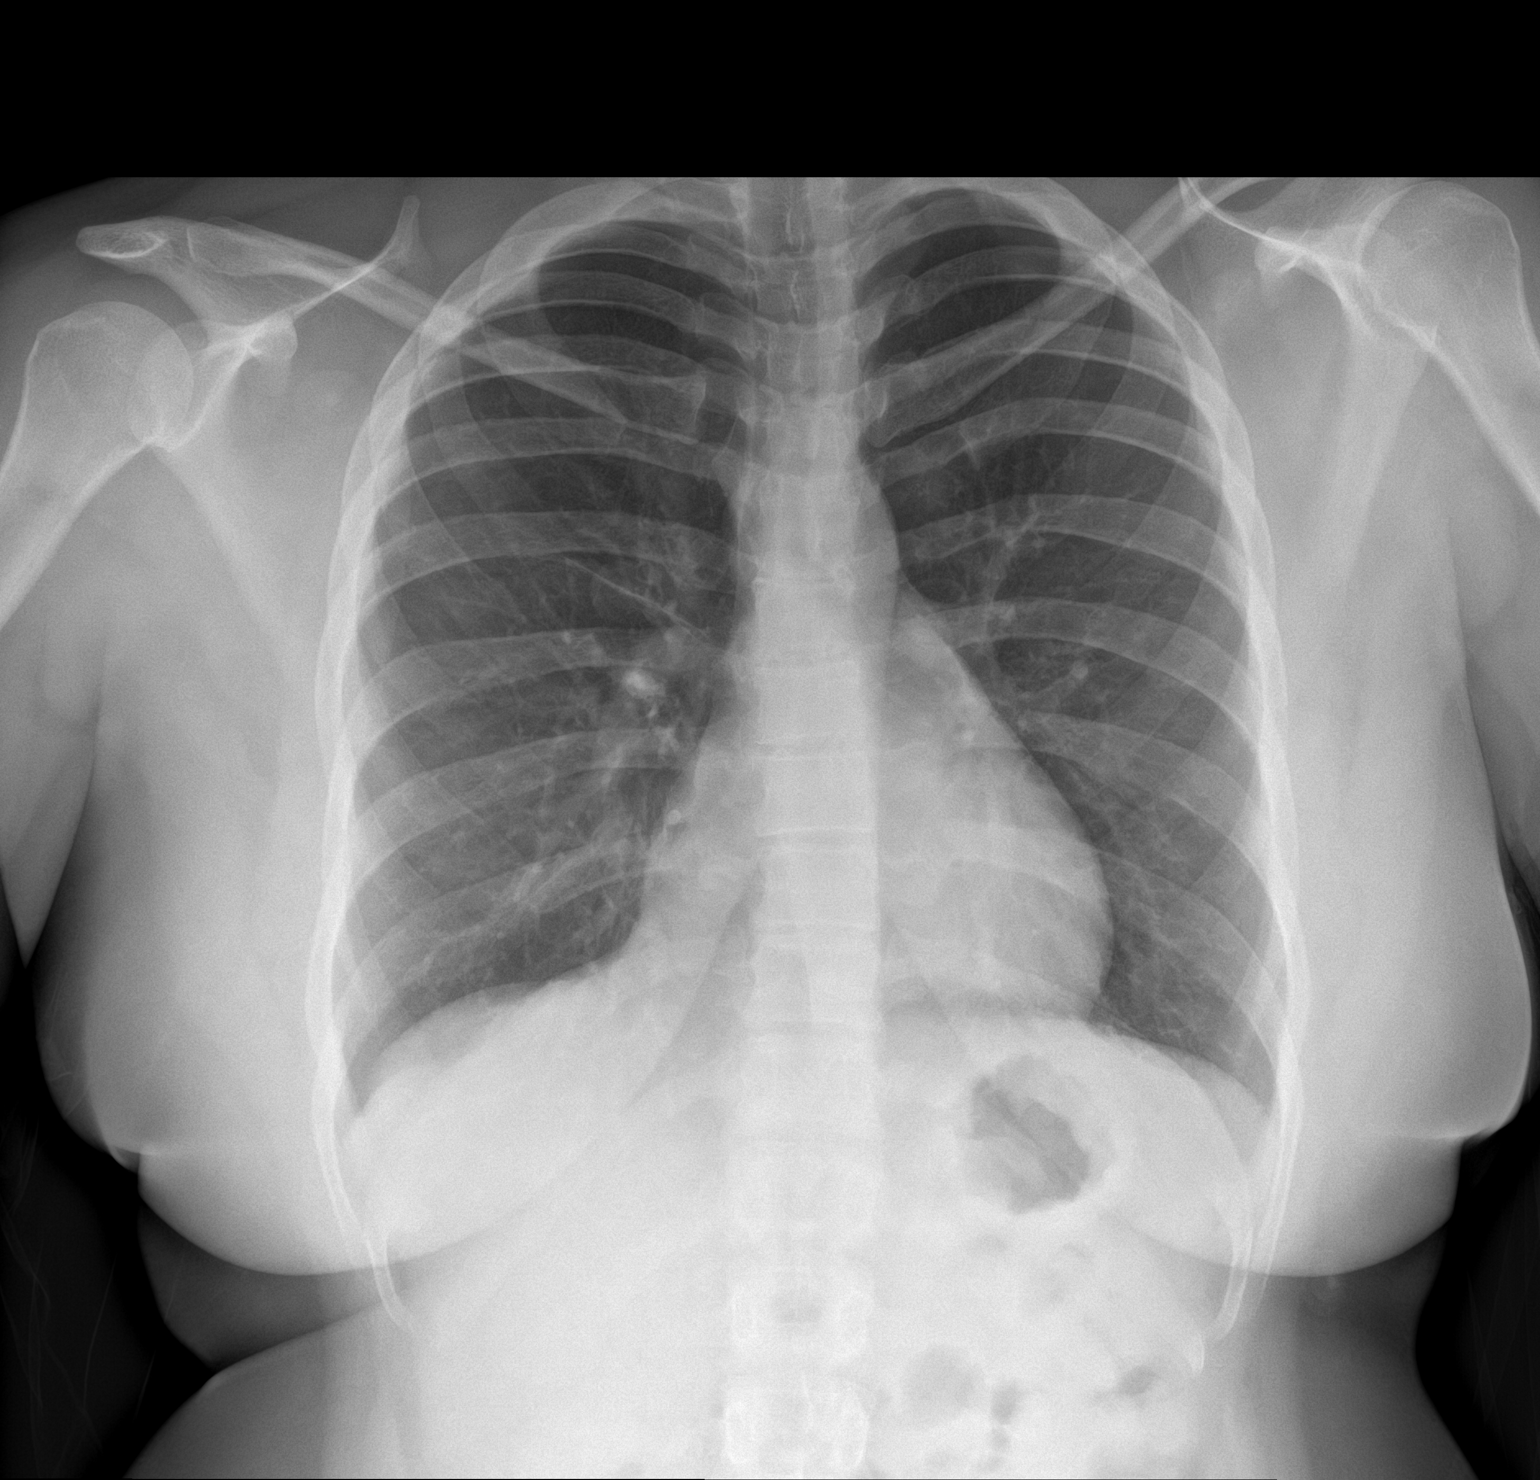

[chest lat]
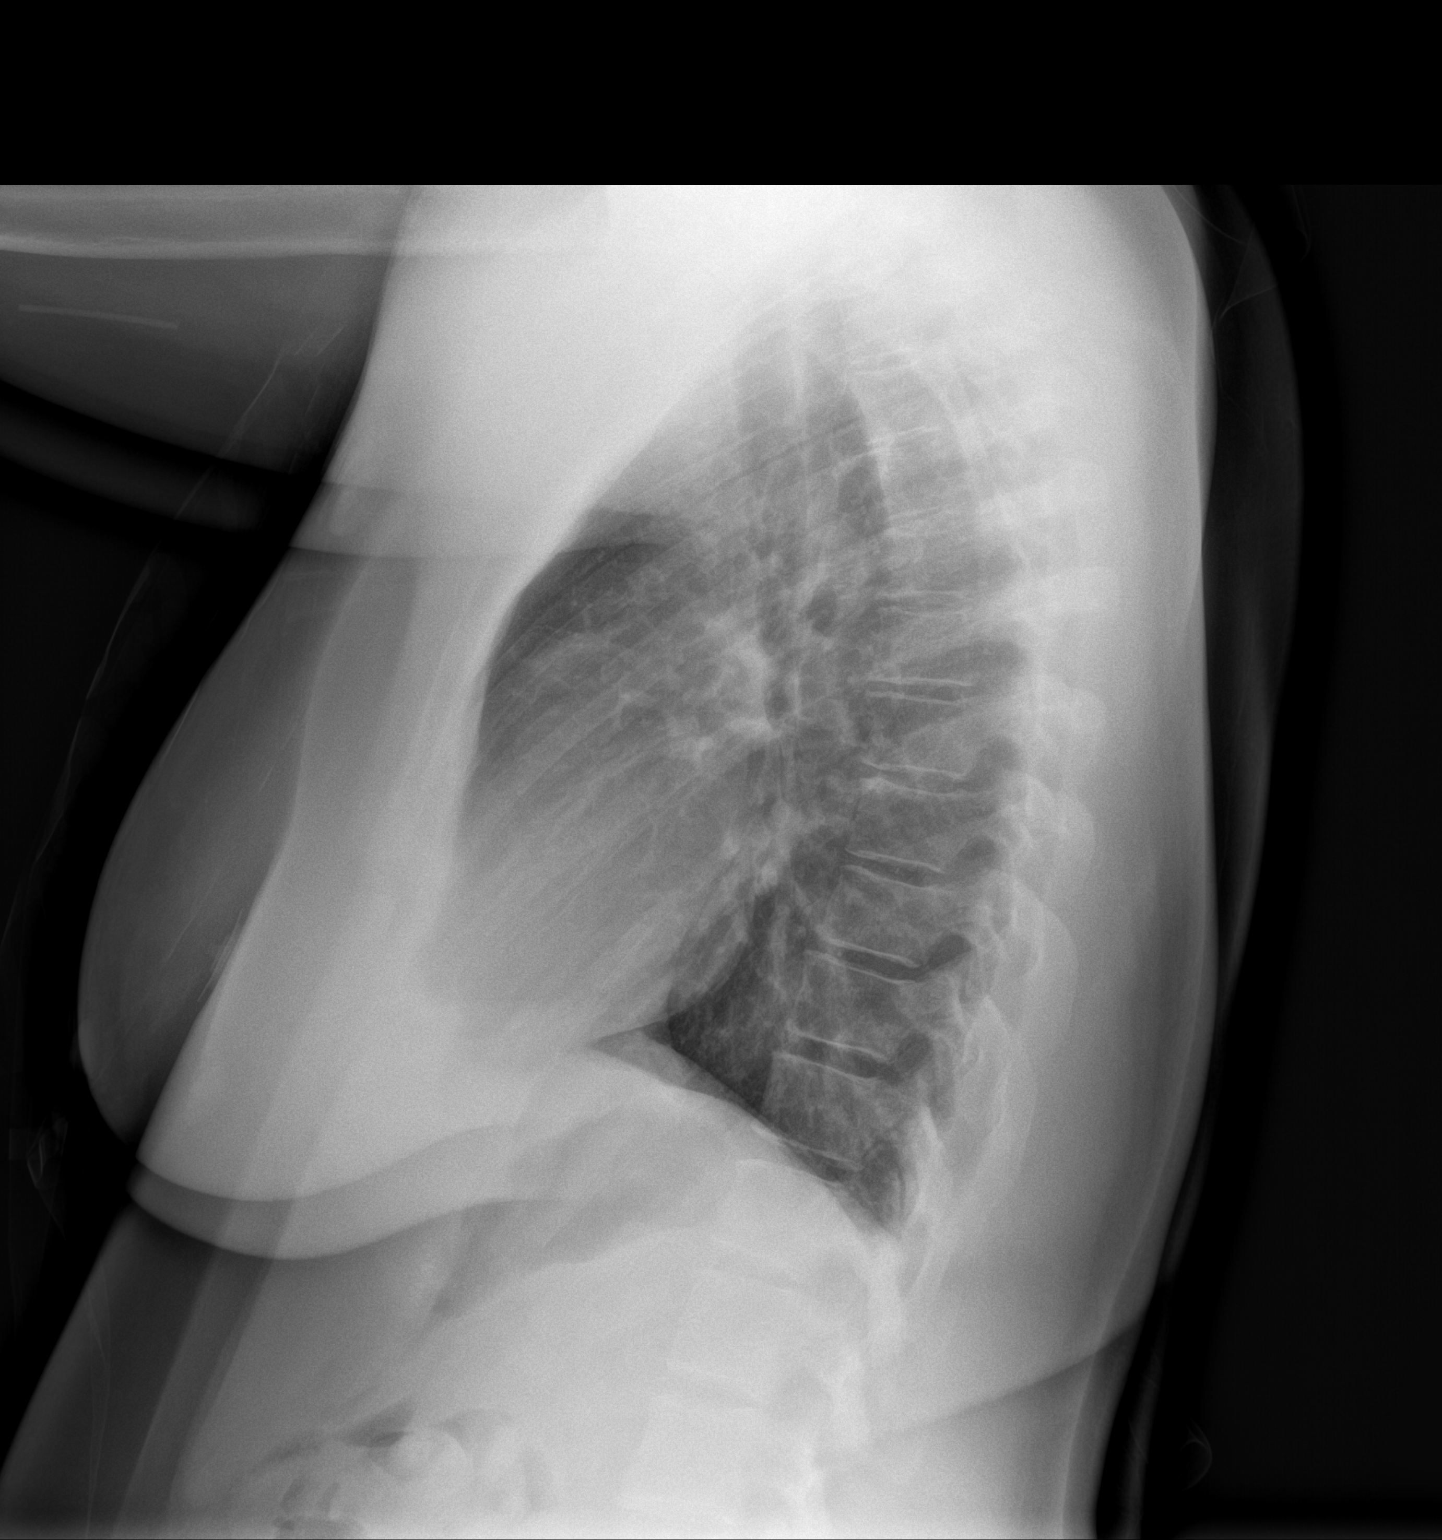

[2 of 2 positions shown; findings below may reference images not displayed]

FINDINGS: The heart size and mediastinal contours are within normal limits.
Both lungs are clear. The visualized skeletal structures are
unremarkable.
IMPRESSION: No active cardiopulmonary disease.

## 2022-08-29 ENCOUNTER — Encounter (HOSPITAL_COMMUNITY): Payer: Self-pay | Admitting: Obstetrics and Gynecology

## 2022-08-29 ENCOUNTER — Other Ambulatory Visit: Payer: Self-pay

## 2022-08-29 ENCOUNTER — Inpatient Hospital Stay (HOSPITAL_COMMUNITY)
Admission: AD | Admit: 2022-08-29 | Discharge: 2022-08-29 | Disposition: A | Discharge status: Home / Self Care | Payer: Medicare HMO | Attending: Obstetrics and Gynecology | Admitting: Obstetrics and Gynecology

## 2022-08-29 DIAGNOSIS — O26893 Other specified pregnancy related conditions, third trimester: Secondary | ICD-10-CM | POA: Diagnosis not present

## 2022-08-29 DIAGNOSIS — R102 Pelvic and perineal pain: Secondary | ICD-10-CM | POA: Diagnosis not present

## 2022-08-29 DIAGNOSIS — Z3A31 31 weeks gestation of pregnancy: Secondary | ICD-10-CM | POA: Insufficient documentation

## 2022-08-29 DIAGNOSIS — N949 Unspecified condition associated with female genital organs and menstrual cycle: Secondary | ICD-10-CM

## 2022-08-29 DIAGNOSIS — O09523 Supervision of elderly multigravida, third trimester: Secondary | ICD-10-CM | POA: Insufficient documentation

## 2022-08-29 DIAGNOSIS — O99891 Other specified diseases and conditions complicating pregnancy: Secondary | ICD-10-CM | POA: Insufficient documentation

## 2022-08-29 LAB — URINALYSIS, ROUTINE W REFLEX MICROSCOPIC
Bilirubin Urine: NEGATIVE
Glucose, UA: NEGATIVE mg/dL
Hgb urine dipstick: NEGATIVE
Ketones, ur: 5 mg/dL — AB
Nitrite: NEGATIVE
Protein, ur: NEGATIVE mg/dL
Specific Gravity, Urine: 1.002 — ABNORMAL LOW (ref 1.005–1.030)
pH: 7 (ref 5.0–8.0)

## 2022-08-29 MED ORDER — CYCLOBENZAPRINE HCL 5 MG PO TABS
5.0000 mg | ORAL_TABLET | Freq: Once | ORAL | Status: AC
  Administered 2022-08-29: 5 mg via ORAL
  Filled 2022-08-29: qty 1

## 2022-08-29 MED ORDER — CYCLOBENZAPRINE HCL 10 MG PO TABS: 10.0000 mg | ORAL_TABLET | Freq: Two times a day (BID) | ORAL | 0 refills | Status: DC | PRN

## 2022-08-29 MED ORDER — ACETAMINOPHEN 500 MG PO TABS
1000.0000 mg | ORAL_TABLET | Freq: Once | ORAL | Status: AC
  Administered 2022-08-29: 1000 mg via ORAL
  Filled 2022-08-29: qty 2

## 2022-08-29 NOTE — MAU Note (Signed)
[name] [name] is a 37 y.o. at [redacted]w[redacted]d here in MAU reporting: she's having constant sharp pain in lower abdomen x2 days.  States "feels like she's about to come out." Reports took one regular Tylenol today @ 1737 this evening, no relief noted.  Denies VB or LOF.  Endorses +FM. LMP: NA Onset of complaint: 2 days ago Pain score: 8 Vitals:   08/29/22 1836  BP: 107/69  Pulse: 88  Resp: 18  Temp: 98.3 F (36.8 C)  SpO2: 99%     FHT:154 bpm Lab orders placed from triage:   UA

## 2022-08-29 NOTE — MAU Provider Note (Signed)
History     CSN: [id number]  Arrival date and time: 08/29/22 9147   Event Date/Time   First Provider Initiated Contact with Patient 08/29/22 1915      Chief Complaint  Patient presents with   Abdominal Pain   HPI [name] [name] is a 37 y.o. [id number] at [redacted]w[redacted]d who presents to MAU for pelvic pain and groin pain. She reports pain started 2 days ago however worsened today. She reports she called her OB office and was told to take Tylenol and to rest which did not help. She reports walking, moving, turning over worsens pain. She reports she took a warm bath which does help however getting in and out of the tub is difficult. She reports a few days ago she had water run down her legs. This has happened a few times. She denies having wear a pad. No vaginal bleeding or urinary s/s. She reports normal fetal movement.  Pregnancy course: A1GDM. Receives [facility], next appointment is tomorrow.    OB History     Gravida  6   Para  4   Term  4   Preterm  0   AB  1   Living  4      SAB  1   IAB  0   Ectopic  0   Multiple  0   Live Births  4           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   History of chlamydia    Migraine     Past Surgical History:  Procedure Laterality Date   APPENDECTOMY     DILATION AND EVACUATION N/A 07/02/2021   Procedure: DILATATION AND EVACUATION CHROMASOMAL STUDIES;  Surgeon: Carlisle Cater, MD;  Location: [facility];  Service: Gynecology;  Laterality: N/A;   EYE SURGERY     TYMPANOSTOMY TUBE PLACEMENT     WISDOM TOOTH EXTRACTION      Family History  Problem Relation Age of Onset   Alcohol abuse Mother    Seizures Father    Alcohol abuse Father     Social History   Tobacco Use   Smoking status: Some Days    Current packs/day: 0.50    Types: Cigarettes   Smokeless tobacco: Never  Vaping Use   Vaping  status: Never Used  Substance Use Topics   Alcohol use: Not Currently    Comment: occasionally   Drug use: Not Currently    Allergies:  Allergies  Allergen Reactions   Septra [Sulfamethoxazole-Trimethoprim] Hives    legs    No medications prior to admission.   Review of Systems  Genitourinary:  Positive for pelvic pain and vaginal discharge.  All other systems reviewed and are negative.  Physical Exam   Blood pressure 115/66, pulse 90, temperature 98.3 F (36.8 C), temperature source Oral, resp. rate 18, height 5\' 3"  (1.6 m), weight 68.6 kg, last menstrual period 02/13/2022, SpO2 100%, unknown if currently breastfeeding.  Physical Exam Vitals and nursing note reviewed. Exam conducted with a chaperone present.  Constitutional:      General: She is not in acute distress. Eyes:     Extraocular Movements: Extraocular movements intact.     Pupils: Pupils are equal, round, and reactive to light.  Cardiovascular:     Rate and Rhythm: Normal rate.  Pulmonary:     Effort:  Pulmonary effort is normal. No respiratory distress.  Abdominal:     Palpations: Abdomen is soft.     Tenderness: There is no abdominal tenderness.  Genitourinary:    Comments: Normal external female genitalia, vaginal walls pink and rugated, scant creamy white discharge, no pooling of amniotic fluid, no bleeding, cervix visually closed without lesions/masses Skin:    General: Skin is warm and dry.  Neurological:     General: No focal deficit present.     Mental Status: She is alert and oriented to person, place, and time.  Psychiatric:        Mood and Affect: Mood normal.        Behavior: Behavior normal.   Dilation: Closed Effacement (%): Thick Exam by:: Lysle Dingwall, CNM  Results for orders placed or performed during the hospital encounter of 08/29/22 (from the past 24 hour(s))  Urinalysis, Routine w reflex microscopic -Urine, Clean Catch     Status: Abnormal   Collection Time: 08/29/22  6:43 PM   Result Value Ref Range   Color, Urine STRAW (A) YELLOW   APPearance CLEAR CLEAR   Specific Gravity, Urine 1.002 (L) 1.005 - 1.030   pH 7.0 5.0 - 8.0   Glucose, UA NEGATIVE NEGATIVE mg/dL   Hgb urine dipstick NEGATIVE NEGATIVE   Bilirubin Urine NEGATIVE NEGATIVE   Ketones, ur 5 (A) NEGATIVE mg/dL   Protein, ur NEGATIVE NEGATIVE mg/dL   Nitrite NEGATIVE NEGATIVE   Leukocytes,Ua MODERATE (A) NEGATIVE   RBC / HPF 0-5 0 - 5 RBC/hpf   WBC, UA 0-5 0 - 5 WBC/hpf   Bacteria, UA RARE (A) NONE SEEN   Squamous Epithelial / HPF 0-5 0 - 5 /HPF   Hyaline Casts, UA PRESENT    NST FHR: 135 bpm, moderate variability, +15x15 accels, no decels Toco: quiet  MAU Course  Procedures  MDM UA NST Tylenol/Flexeril  UA negative. NST reactive, toco quiet. Cervix closed. Patient given Tylenol and Flexeril and pain improved to 3/10.   Assessment and Plan   1. [redacted] weeks gestation of pregnancy   2. Pelvic pain affecting pregnancy in third trimester, antepartum   3. Round ligament pain    - Discharge home in stable condition - Rx for Flexeril - Recommend support belt. May continue to use warm bath - Return precautions given - Keep OB appointment as scheduled tomorrow  Brand Males, CNM 08/29/2022, 10:21 PM

## 2022-09-30 ENCOUNTER — Encounter (HOSPITAL_COMMUNITY): Payer: Self-pay | Admitting: Obstetrics and Gynecology

## 2022-09-30 ENCOUNTER — Inpatient Hospital Stay (HOSPITAL_COMMUNITY)
Admission: AD | Admit: 2022-09-30 | Discharge: 2022-09-30 | Disposition: A | Discharge status: Home / Self Care | Payer: Medicare HMO | Attending: Obstetrics and Gynecology | Admitting: Obstetrics and Gynecology

## 2022-09-30 DIAGNOSIS — O479 False labor, unspecified: Secondary | ICD-10-CM | POA: Diagnosis not present

## 2022-09-30 DIAGNOSIS — Z3A36 36 weeks gestation of pregnancy: Secondary | ICD-10-CM

## 2022-09-30 DIAGNOSIS — O471 False labor at or after 37 completed weeks of gestation: Secondary | ICD-10-CM | POA: Diagnosis present

## 2022-09-30 LAB — URINALYSIS, ROUTINE W REFLEX MICROSCOPIC
Glucose, UA: NEGATIVE mg/dL
Hgb urine dipstick: NEGATIVE
Ketones, ur: 20 mg/dL — AB
Leukocytes,Ua: NEGATIVE
Nitrite: NEGATIVE
Protein, ur: 30 mg/dL — AB
Specific Gravity, Urine: 1.019 (ref 1.005–1.030)
pH: 6 (ref 5.0–8.0)

## 2022-09-30 LAB — WET PREP, GENITAL
Clue Cells Wet Prep HPF POC: NONE SEEN
Sperm: NONE SEEN
Trich, Wet Prep: NONE SEEN
WBC, Wet Prep HPF POC: <10 (ref <10)
Yeast Wet Prep HPF POC: NONE SEEN

## 2022-09-30 MED ORDER — CYCLOBENZAPRINE HCL 5 MG PO TABS
10.0000 mg | ORAL_TABLET | Freq: Three times a day (TID) | ORAL | Status: DC | PRN
  Administered 2022-09-30: 10 mg via ORAL
  Filled 2022-09-30: qty 2

## 2022-09-30 MED ORDER — ACETAMINOPHEN 500 MG PO TABS
1000.0000 mg | ORAL_TABLET | Freq: Once | ORAL | Status: AC
  Administered 2022-09-30: 1000 mg via ORAL
  Filled 2022-09-30: qty 2

## 2022-09-30 NOTE — MAU Provider Note (Signed)
  S: Ms. [name] [name] is a 37 y.o. [phone] at [redacted]w[redacted]d  who presents to MAU today complaining contractions q 10 minutes since yesterday. She denies vaginal bleeding. She denies LOF. She reports normal fetal movement.    O: BP 114/79   Pulse 83   Temp 98.3 F (36.8 C)   Resp (!) 98   Ht 5\' 3"  (1.6 m)   LMP 02/13/2022   BMI 26.80 kg/m  GENERAL: Well-developed, well-nourished female in no acute distress.  HEAD: Normocephalic, atraumatic.  CHEST: Normal effort of breathing, regular heart rate ABDOMEN: Soft, nontender, gravid  Cervical exam: 1 cm, same as previous  Fetal Monitoring: Baseline: 145 Variability: moderate Accelerations: present Decelerations: absent Contractions: irregular, mild, talking through without pause  A: SIUP at [redacted]w[redacted]d  False labor  P: Discharge home Preterm labor precautions provided  Wyn Forster, MD 09/30/2022 9:20 PM

## 2022-09-30 NOTE — MAU Note (Signed)
.  [name] [name] is a 37 y.o. at [redacted]w[redacted]d here in MAU reporting: c/o abd pain last night  was every 10 min last night. Was unable to sleep. Went to bed around 6 am. Stated still having pains but not as often. Denies any vag bleeding reports a clear yellow bag discharge.  Good fetal movement felt. Has been having some N/V todaya s well.  LMP:  Onset of complaint: last night Pain score: 8 Vitals:   09/30/22 2004  BP: 111/82  Pulse: (!) 102  Resp: (!) 98  Temp: 98.3 F (36.8 C)     FHT:145 Lab orders placed from triage:  mau labor eval

## 2022-10-03 LAB — GC/CHLAMYDIA PROBE AMP (~~LOC~~) NOT AT ARMC
Chlamydia: NEGATIVE
Comment: NEGATIVE
Comment: NORMAL
Neisseria Gonorrhea: NEGATIVE

## 2022-10-13 ENCOUNTER — Inpatient Hospital Stay (HOSPITAL_COMMUNITY)
Admission: AD | Admit: 2022-10-13 | Discharge: 2022-10-15 | DRG: 807 | Disposition: A | Discharge status: Home / Self Care | Payer: Medicare HMO | Attending: Obstetrics and Gynecology | Admitting: Obstetrics and Gynecology

## 2022-10-13 ENCOUNTER — Other Ambulatory Visit: Payer: Self-pay

## 2022-10-13 ENCOUNTER — Encounter (HOSPITAL_COMMUNITY): Payer: Self-pay | Admitting: Obstetrics & Gynecology

## 2022-10-13 ENCOUNTER — Encounter (HOSPITAL_COMMUNITY): Payer: Self-pay | Admitting: Obstetrics and Gynecology

## 2022-10-13 ENCOUNTER — Inpatient Hospital Stay (HOSPITAL_COMMUNITY)
Admission: AD | Admit: 2022-10-13 | Discharge: 2022-10-13 | Disposition: A | Discharge status: Home / Self Care | Payer: Medicare HMO | Attending: Obstetrics & Gynecology | Admitting: Obstetrics & Gynecology

## 2022-10-13 DIAGNOSIS — A6000 Herpesviral infection of urogenital system, unspecified: Secondary | ICD-10-CM | POA: Diagnosis present

## 2022-10-13 DIAGNOSIS — F79 Unspecified intellectual disabilities: Secondary | ICD-10-CM | POA: Diagnosis present

## 2022-10-13 DIAGNOSIS — Z3A38 38 weeks gestation of pregnancy: Secondary | ICD-10-CM

## 2022-10-13 DIAGNOSIS — O09523 Supervision of elderly multigravida, third trimester: Secondary | ICD-10-CM | POA: Insufficient documentation

## 2022-10-13 DIAGNOSIS — Z6791 Unspecified blood type, Rh negative: Secondary | ICD-10-CM

## 2022-10-13 DIAGNOSIS — F17210 Nicotine dependence, cigarettes, uncomplicated: Secondary | ICD-10-CM | POA: Diagnosis present

## 2022-10-13 DIAGNOSIS — O99334 Smoking (tobacco) complicating childbirth: Secondary | ICD-10-CM | POA: Diagnosis present

## 2022-10-13 DIAGNOSIS — O471 False labor at or after 37 completed weeks of gestation: Secondary | ICD-10-CM | POA: Insufficient documentation

## 2022-10-13 DIAGNOSIS — O479 False labor, unspecified: Secondary | ICD-10-CM

## 2022-10-13 DIAGNOSIS — O9832 Other infections with a predominantly sexual mode of transmission complicating childbirth: Secondary | ICD-10-CM | POA: Diagnosis present

## 2022-10-13 DIAGNOSIS — Z23 Encounter for immunization: Secondary | ICD-10-CM

## 2022-10-13 DIAGNOSIS — O99344 Other mental disorders complicating childbirth: Secondary | ICD-10-CM | POA: Diagnosis present

## 2022-10-13 DIAGNOSIS — O4290 Premature rupture of membranes, unspecified as to length of time between rupture and onset of labor, unspecified weeks of gestation: Principal | ICD-10-CM

## 2022-10-13 DIAGNOSIS — Z811 Family history of alcohol abuse and dependence: Secondary | ICD-10-CM

## 2022-10-13 DIAGNOSIS — N898 Other specified noninflammatory disorders of vagina: Secondary | ICD-10-CM | POA: Diagnosis present

## 2022-10-13 DIAGNOSIS — O9089 Other complications of the puerperium, not elsewhere classified: Secondary | ICD-10-CM | POA: Diagnosis not present

## 2022-10-13 DIAGNOSIS — O26893 Other specified pregnancy related conditions, third trimester: Secondary | ICD-10-CM | POA: Diagnosis present

## 2022-10-13 LAB — CBC
HCT: 33.5 % — ABNORMAL LOW (ref 36.0–46.0)
Hemoglobin: 11.2 g/dL — ABNORMAL LOW (ref 12.0–15.0)
MCH: 28.7 pg (ref 26.0–34.0)
MCHC: 33.4 g/dL (ref 30.0–36.0)
MCV: 85.9 fL (ref 80.0–100.0)
Platelets: 298 10*3/uL (ref 150–400)
RBC: 3.9 MIL/uL (ref 3.87–5.11)
RDW: 14 % (ref 11.5–15.5)
WBC: 15.1 10*3/uL — ABNORMAL HIGH (ref 4.0–10.5)
nRBC: 0 % (ref 0.0–0.2)

## 2022-10-13 LAB — URINALYSIS, ROUTINE W REFLEX MICROSCOPIC
Bilirubin Urine: NEGATIVE
Glucose, UA: NEGATIVE mg/dL
Ketones, ur: 20 mg/dL — AB
Nitrite: NEGATIVE
Protein, ur: 30 mg/dL — AB
Specific Gravity, Urine: 1.017 (ref 1.005–1.030)
WBC, UA: >50 WBC/hpf (ref 0–5)
pH: 6 (ref 5.0–8.0)

## 2022-10-13 LAB — OB RESULTS CONSOLE HIV ANTIBODY (ROUTINE TESTING): HIV: NONREACTIVE

## 2022-10-13 LAB — OB RESULTS CONSOLE RPR: RPR: NONREACTIVE

## 2022-10-13 LAB — OB RESULTS CONSOLE RUBELLA ANTIBODY, IGM: Rubella: IMMUNE

## 2022-10-13 LAB — TYPE AND SCREEN
ABO/RH(D): O NEG
Antibody Screen: NEGATIVE

## 2022-10-13 LAB — OB RESULTS CONSOLE HEPATITIS B SURFACE ANTIGEN: Hepatitis B Surface Ag: NEGATIVE

## 2022-10-13 MED ORDER — ONDANSETRON 4 MG PO TBDP
8.0000 mg | ORAL_TABLET | Freq: Once | ORAL | Status: AC
  Administered 2022-10-13: 8 mg via ORAL
  Filled 2022-10-13: qty 2

## 2022-10-13 MED ORDER — IBUPROFEN 600 MG PO TABS
600.0000 mg | ORAL_TABLET | Freq: Four times a day (QID) | ORAL | Status: DC
  Administered 2022-10-13 – 2022-10-15 (×6): 600 mg via ORAL
  Filled 2022-10-13 (×7): qty 1

## 2022-10-13 MED ORDER — SOD CITRATE-CITRIC ACID 500-334 MG/5ML PO SOLN: 30.0000 mL | ORAL | Status: DC | PRN

## 2022-10-13 MED ORDER — OXYCODONE-ACETAMINOPHEN 5-325 MG PO TABS
1.0000 | ORAL_TABLET | ORAL | Status: DC | PRN
  Administered 2022-10-13: 1 via ORAL
  Filled 2022-10-13: qty 1

## 2022-10-13 MED ORDER — PHENYLEPHRINE 80 MCG/ML (10ML) SYRINGE FOR IV PUSH (FOR BLOOD PRESSURE SUPPORT): 80.0000 ug | PREFILLED_SYRINGE | INTRAVENOUS | Status: DC | PRN

## 2022-10-13 MED ORDER — TETANUS-DIPHTH-ACELL PERTUSSIS 5-2.5-18.5 LF-MCG/0.5 IM SUSY: 0.5000 mL | PREFILLED_SYRINGE | Freq: Once | INTRAMUSCULAR | Status: DC

## 2022-10-13 MED ORDER — CYCLOBENZAPRINE HCL 5 MG PO TABS
10.0000 mg | ORAL_TABLET | Freq: Once | ORAL | Status: AC
  Administered 2022-10-13: 10 mg via ORAL
  Filled 2022-10-13: qty 2

## 2022-10-13 MED ORDER — DIPHENHYDRAMINE HCL 50 MG/ML IJ SOLN: 12.5000 mg | INTRAMUSCULAR | Status: DC | PRN

## 2022-10-13 MED ORDER — ZOLPIDEM TARTRATE 5 MG PO TABS: 5.0000 mg | ORAL_TABLET | Freq: Every evening | ORAL | Status: DC | PRN

## 2022-10-13 MED ORDER — LACTATED RINGERS IV SOLN: INTRAVENOUS | Status: DC

## 2022-10-13 MED ORDER — ACETAMINOPHEN 325 MG PO TABS: 650.0000 mg | ORAL_TABLET | ORAL | Status: DC | PRN

## 2022-10-13 MED ORDER — FENTANYL-BUPIVACAINE-NACL 0.5-0.125-0.9 MG/250ML-% EP SOLN
12.0000 mL/h | EPIDURAL | Status: DC | PRN
  Filled 2022-10-13: qty 250

## 2022-10-13 MED ORDER — DIBUCAINE (PERIANAL) 1 % EX OINT: 1.0000 | TOPICAL_OINTMENT | CUTANEOUS | Status: DC | PRN

## 2022-10-13 MED ORDER — FLEET ENEMA RE ENEM: 1.0000 | ENEMA | Freq: Every day | RECTAL | Status: DC | PRN

## 2022-10-13 MED ORDER — EPHEDRINE 5 MG/ML INJ: 10.0000 mg | INTRAVENOUS | Status: DC | PRN

## 2022-10-13 MED ORDER — DIPHENHYDRAMINE HCL 25 MG PO CAPS: 25.0000 mg | ORAL_CAPSULE | Freq: Four times a day (QID) | ORAL | Status: DC | PRN

## 2022-10-13 MED ORDER — OXYTOCIN-SODIUM CHLORIDE 30-0.9 UT/500ML-% IV SOLN
2.5000 [IU]/h | INTRAVENOUS | Status: DC
  Administered 2022-10-13: 2.5 [IU]/h via INTRAVENOUS
  Filled 2022-10-13: qty 500

## 2022-10-13 MED ORDER — ONDANSETRON HCL 4 MG/2ML IJ SOLN: 4.0000 mg | Freq: Four times a day (QID) | INTRAMUSCULAR | Status: DC | PRN

## 2022-10-13 MED ORDER — WITCH HAZEL-GLYCERIN EX PADS: 1.0000 | MEDICATED_PAD | CUTANEOUS | Status: DC | PRN

## 2022-10-13 MED ORDER — COCONUT OIL OIL: 1.0000 | TOPICAL_OIL | Status: DC | PRN

## 2022-10-13 MED ORDER — ONDANSETRON HCL 4 MG/2ML IJ SOLN: 4.0000 mg | INTRAMUSCULAR | Status: DC | PRN

## 2022-10-13 MED ORDER — OXYCODONE-ACETAMINOPHEN 5-325 MG PO TABS: 2.0000 | ORAL_TABLET | ORAL | Status: DC | PRN

## 2022-10-13 MED ORDER — OXYTOCIN BOLUS FROM INFUSION
333.0000 mL | Freq: Once | INTRAVENOUS | Status: AC
  Administered 2022-10-13: 333 mL via INTRAVENOUS

## 2022-10-13 MED ORDER — ONDANSETRON HCL 4 MG PO TABS: 4.0000 mg | ORAL_TABLET | ORAL | Status: DC | PRN

## 2022-10-13 MED ORDER — LACTATED RINGERS IV SOLN: 500.0000 mL | INTRAVENOUS | Status: DC | PRN

## 2022-10-13 MED ORDER — LACTATED RINGERS IV SOLN: 500.0000 mL | Freq: Once | INTRAVENOUS | Status: DC

## 2022-10-13 MED ORDER — LIDOCAINE HCL (PF) 1 % IJ SOLN: 30.0000 mL | INTRAMUSCULAR | Status: DC | PRN

## 2022-10-13 MED ORDER — SENNOSIDES-DOCUSATE SODIUM 8.6-50 MG PO TABS
2.0000 | ORAL_TABLET | Freq: Every day | ORAL | Status: DC
  Administered 2022-10-14 – 2022-10-15 (×2): 2 via ORAL
  Filled 2022-10-13 (×2): qty 2

## 2022-10-13 MED ORDER — PRENATAL MULTIVITAMIN CH
1.0000 | ORAL_TABLET | Freq: Every day | ORAL | Status: DC
  Administered 2022-10-14 – 2022-10-15 (×2): 1 via ORAL
  Filled 2022-10-13 (×2): qty 1

## 2022-10-13 MED ORDER — SIMETHICONE 80 MG PO CHEW: 80.0000 mg | CHEWABLE_TABLET | ORAL | Status: DC | PRN

## 2022-10-13 MED ORDER — BENZOCAINE-MENTHOL 20-0.5 % EX AERO: 1.0000 | INHALATION_SPRAY | CUTANEOUS | Status: DC | PRN

## 2022-10-13 MED ORDER — FENTANYL CITRATE (PF) 100 MCG/2ML IJ SOLN
50.0000 ug | INTRAMUSCULAR | Status: DC | PRN
  Administered 2022-10-13: 100 ug via INTRAVENOUS
  Filled 2022-10-13: qty 2

## 2022-10-13 NOTE — H&P (Signed)
[name] [name] is a 37 y.o. female presenting for contractions and ROM via EMS. Clear gush around 1600 followed by regular painful contractions. 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 Missed AB prior to this pregnancy  OB History     Gravida  6   Para  4   Term  4   Preterm  0   AB  1   Living  4      SAB  1   IAB  0   Ectopic  0   Multiple  0   Live Births  4          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   History of chlamydia    Migraine    Past Surgical History:  Procedure Laterality Date   APPENDECTOMY     DILATION AND EVACUATION N/A 07/02/2021   Procedure: DILATATION AND EVACUATION CHROMASOMAL STUDIES;  Surgeon: Carlisle Cater, MD;  Location: [facility];  Service: Gynecology;  Laterality: N/A;   EYE SURGERY     TYMPANOSTOMY TUBE PLACEMENT     WISDOM TOOTH EXTRACTION     Family History: family history includes Alcohol abuse in her father and mother; Seizures in her father. Social History:  reports that she has been smoking cigarettes. She has never used smokeless tobacco. She reports that she does not currently use alcohol. She reports that she does not currently use drugs.     Maternal Diabetes: Noelevated 1hr at 168, pt checked FSBG for , WNL Genetic Screening: Normal Maternal Ultrasounds/Referrals: Normal Fetal Ultrasounds or other Referrals:  None Maternal Substance Abuse:  No Significant Maternal Medications:  None Significant Maternal Lab Results:  Group B Strep negative and Rh negative Number of Prenatal Visits:greater than 3 verified prenatal visits Maternal  Vaccinations:TDap Declined RSV, flu Other Comments:  None  Review of Systems  Constitutional:  Negative for chills and fever.  Respiratory:  Negative for shortness of breath.   Cardiovascular:  Negative for chest pain, palpitations and leg swelling.  Gastrointestinal:  Negative for abdominal pain and vomiting.  Neurological:  Negative for dizziness, weakness and headaches.  Psychiatric/Behavioral:  Negative for suicidal ideas.    Maternal Medical History:  Reason for admission: Rupture of membranes and contractions.   Contractions: Onset was 1-2 hours ago.   Frequency: regular.   Perceived severity is strong.   Fetal activity: Perceived fetal activity is normal.   Prenatal complications: No bleeding, PIH or preterm labor.   Prenatal Complications - Diabetes: none.   Dilation: 4 Effacement (%): 90 Station: 0 Exam by:: Jaydee Ingman Blood pressure 115/69, pulse 79, temperature 98.7 F (37.1 C), temperature source Oral, resp. rate 16, last menstrual period 02/13/2022, SpO2 99%, unknown if currently breastfeeding. Exam Physical Exam Constitutional:      General: She is not in acute distress.    Appearance: She is well-developed.  HENT:     Head: Normocephalic and atraumatic.  Eyes:     Pupils: Pupils are equal, round, and reactive to light.  Cardiovascular:     Rate and Rhythm: Normal  rate and regular rhythm.     Heart sounds: No murmur heard.    No gallop.  Abdominal:     Tenderness: There is no abdominal tenderness. There is no guarding or rebound.  Genitourinary:    Vagina: Normal.     Uterus: Normal.   Musculoskeletal:        General: Normal range of motion.     Cervical back: Normal range of motion and neck supple.  Skin:    General: Skin is warm and dry.  Neurological:     Mental Status: She is alert and oriented to person, place, and time.     Prenatal labs: ABO, Rh: --/--/O NEG (09/19 1745) Antibody: NEG (09/19 1745) Rubella:  imm RPR:   nr  HBsAg:    neg HIV:   nr GBS:   NEG  Cat 1 with early decels TOCO q73m  Assessment/Plan: This is a 37yo [id number] @ 38 2/7 by LMP c/w 1st trim TVUS admitted in active labor and SORM, GBS neg, CE 40, painfully contracting. Patient desired epidural but progressed quickly to complete, please see delivery note.    [name] [name] [name] [name] 10/13/2022, 6:58 PM

## 2022-10-13 NOTE — MAU Note (Signed)
..  [name] [name] is a 37 y.o. at [redacted]w[redacted]d here in MAU reporting: ctx since 0700 this morning and noticed some blood when she wipes. Reporting she had sex at 0530 this morning. She also feels a lot of pelvic pressure. States they are now every 2-5 minutes. Denies LOF. +FM.   Onset of complaint: 0700 Pain score: 9 Vitals:   10/13/22 1015  BP: 124/66  Pulse: 60  Resp: 20  Temp: 98 F (36.7 C)  SpO2: 99%     FHT:130 Lab orders placed from triage:   mau labor

## 2022-10-13 NOTE — MAU Provider Note (Signed)
None      S: Ms. [name] [name] is a 37 y.o. [phone] at [redacted]w[redacted]d  who presents to MAU today complaining contractions q 2-5 minutes since this morning. She denies vaginal bleeding. She denies LOF. She reports normal fetal movement.    O: BP 124/66 (BP Location: Right Arm)   Pulse 60   Temp 98 F (36.7 C) (Oral)   Resp 20   LMP 02/13/2022   SpO2 99%  GENERAL: Well-developed, well-nourished female in no acute distress.  HEAD: Normocephalic, atraumatic.  CHEST: Normal effort of breathing, regular heart rate ABDOMEN: Soft, nontender, gravid  Cervical exam:  Dilation: 1.5 Effacement (%): 50 Station: -3 Presentation: Vertex Exam by:: Holly Flippin RN   Fetal Monitoring: Baseline: 130 Variability: moderate Accelerations: 15x15 Decelerations: no Contractions: Q 2-6 minutes   A: SIUP at [redacted]w[redacted]d  False labor -Cervix unchanged after monitoring. Offered to continue to monitor & recheck, patient states she would rather go home. Requesting dose of flexeril. Given prior to discharge.   P: Discharge home in stable condition Reviewed labor precautions  Judeth Horn, NP 10/13/2022 11:43 AM

## 2022-10-13 NOTE — MAU Provider Note (Signed)
S: Ms. [name] [name] is a 37 y.o. [phone] at [redacted]w[redacted]d  who presents to MAU today complaining of leaking of fluid since an hour ago. She denies vaginal bleeding. She endorses contractions. She reports normal fetal movement.    O: LMP 02/13/2022  GENERAL: Well-developed, well-nourished female in no acute distress.  HEAD: Normocephalic, atraumatic.  CHEST: Normal effort of breathing, regular heart rate ABDOMEN: Soft, nontender, gravid PELVIC: Normal external female genitalia. Vagina is pink and rugated. Cervix with normal contour, no lesions. Normal discharge. Pooling copious amount of clear/pink fluid. No lesions noted externally. Unable to visualize internally due to amount of amniotic fluid.   Cervical exam:  Dilation: 4 Presentation: Vertex Exam by:: Erle Crocker, RN    A: SIUP at [redacted]w[redacted]d  SROM  P: Dr. 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 Has been prescribed suppression - unclear if patient taking it. Exam performed - no lesions noted externally but unable to get good visualization internally due to amount of amniotic fluid.   Judeth Horn, NP 10/13/2022 5:42 PM

## 2022-10-14 ENCOUNTER — Encounter (HOSPITAL_COMMUNITY)
Admission: AD | Disposition: A | Discharge status: Home / Self Care | Payer: Self-pay | Source: Home / Self Care | Attending: Obstetrics and Gynecology

## 2022-10-14 LAB — CULTURE, OB URINE: Special Requests: NORMAL

## 2022-10-14 LAB — CBC
HCT: 31 % — ABNORMAL LOW (ref 36.0–46.0)
Hemoglobin: 10.1 g/dL — ABNORMAL LOW (ref 12.0–15.0)
MCH: 27.7 pg (ref 26.0–34.0)
MCHC: 32.6 g/dL (ref 30.0–36.0)
MCV: 85.2 fL (ref 80.0–100.0)
Platelets: 275 10*3/uL (ref 150–400)
RBC: 3.64 MIL/uL — ABNORMAL LOW (ref 3.87–5.11)
RDW: 13.9 % (ref 11.5–15.5)
WBC: 16.4 10*3/uL — ABNORMAL HIGH (ref 4.0–10.5)
nRBC: 0 % (ref 0.0–0.2)

## 2022-10-14 LAB — RPR: RPR Ser Ql: NONREACTIVE

## 2022-10-14 SURGERY — LIGATION, FALLOPIAN TUBE, POSTPARTUM
Anesthesia: Choice

## 2022-10-14 MED ORDER — RHO D IMMUNE GLOBULIN 1500 UNIT/2ML IJ SOSY
300.0000 ug | PREFILLED_SYRINGE | Freq: Once | INTRAMUSCULAR | Status: AC
  Administered 2022-10-14: 300 ug via INTRAVENOUS
  Filled 2022-10-14: qty 2

## 2022-10-14 NOTE — Lactation Note (Signed)
This note was copied from a baby's chart.  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  Baby is being gavage fed formula.  LC assisted with double pumping using a pumping top.  Lots of teaching done and questions answered.  LC provided a washing and drying bin and educated on the importance of disassembling pump parts, washing, rinsing and air drying on clean paper towel.  OBJECTIVE Infant data: Mother's Current Feeding Choice: Breast Milk and Formula  O2 Device: Room Air  Infant feeding assessment Scale for Readiness: 3   Maternal data: [id number] Vaginal, Spontaneous Has patient been taught Hand Expression?: Yes Hand Expression Comments: dot of colostrum noted Does the patient have breastfeeding experience prior to this delivery?: No How long did the patient breastfeed?: 0 Pumping frequency: Encouraged Mom to pump both breasts 8 times per 24 hrs Flange Size: 21 Hands-free pumping top sizes: Small/Medium (Blue)  WIC Program: Yes WIC Referral Sent?: Yes What county?: [place]  Feeding method: Tube/Gavage (Bolus) Nipple Type: Nfant Standard Flow (white)  Maternal: No data recorded INTERVENTIONS/PLAN Interventions: Interventions: Breast feeding basics reviewed; Skin to skin; Breast massage; Hand express; DEBP; Education; [employer] brochure Tools: Pump; Flanges; Hands-free pumping top Pump Education: Setup, frequency, and cleaning; Milk Storage  Plan: 1- Pump both breasts every 2-3 hrs between naps. 2- breast massage and hand express colostrum 3- STS with baby when off phototherapy.  Consult Status: NICU  follow-up NICU Follow-up type: Verify onset of copious milk; Verify absence of engorgement   Judee Clara 10/14/2022, 2:24 PM

## 2022-10-14 NOTE — Progress Notes (Signed)
Patient is doing well.  She is ambulating, voiding, tolerating PO.  Pain control is good.  Lochia is appropriate.  Baby was transferred to the NICU this AM for triple phototherapy  Vitals:   10/14/22 0515 10/14/22 0827 10/14/22 1017 10/14/22 1500  BP: 121/76  108/76 104/66  Pulse: 75 77 83 74  Resp: 16 18    Temp: 98.9 F (37.2 C) 98.4 F (36.9 C)  98.5 F (36.9 C)  TempSrc: Oral Oral  Oral  SpO2: 99%  99% 99%    NAD Fundus firm Ext: no edema  Lab Results  Component Value Date   WBC 16.4 (H) 10/14/2022   HGB 10.1 (L) 10/14/2022   HCT 31.0 (L) 10/14/2022   MCV 85.2 10/14/2022   PLT 275 10/14/2022    --/--/O NEG (09/19 1745)  A/P 37 y.o. [id number] PPD#1 s/p TSVD. Routine care.   Rh negative: rhogam studies pending Undesired fertility: plan for interval BTL  Tessia Kassin GEFFEL [employer]

## 2022-10-14 NOTE — Lactation Note (Signed)
This note was copied from a baby's chart. Lactation Consultation Note Mom stated the baby is BF well but only likes one breast. LC assessed the opposite breast and it has good everted nipple and colostrum noted. Explained must be positioning. Attempted to latch baby but she was not interested in BF at this time.  Mom has DEBP set up at bedside. LC discussed how to use it and mom pumped. She was very disappointed that she was getting anything.  Discussed w/mom about the importance of pumping and supplementing. LC filled out Plan of Card feeding card and reviewed. Asked RN to get similac 22 cal for mom to use. Newborn feeding habits, STS, I&O, support, positioning reviewed.  Mom encouraged to feed baby 8-12 times/24 hours and with feeding cues. Encouraged mom to call for assistance as needed.  Patient Name: [name] [name] Date: 10/14/2022 Age:35 hours Reason for consult: Initial assessment;Early term 37-38.6wks;Infant < 6lbs   Maternal Data Has patient been taught Hand Expression?: Yes Does the patient have breastfeeding experience prior to this delivery?: No How long did the patient breastfeed?: 0  Feeding Mother's Current Feeding Choice: Breast Milk and Formula  LATCH Score Latch: Grasps breast easily, tongue down, lips flanged, rhythmical sucking.  Audible Swallowing: A few with stimulation  Type of Nipple: Everted at rest and after stimulation  Comfort (Breast/Nipple): Soft / non-tender  Hold (Positioning): No assistance needed to correctly position infant at breast.  LATCH Score: 9   Lactation Tools Discussed/Used Tools: Pump;Flanges Flange Size: 21 Breast pump type: Double-Electric Breast Pump Pump Education: Setup, frequency, and cleaning Reason for Pumping: less than 6 lbs Pumping frequency: q 3hr  Interventions Interventions: Breast feeding basics reviewed;Skin to skin;Adjust position;Support pillows;Breast compression;Position options;DEBP;LC Services  brochure  Discharge    Consult Status Consult Status: Follow-up Date: 10/14/22 Follow-up type: In-patient    Joanna Borawski, Diamond Nickel 10/14/2022, 1:02 AM

## 2022-10-14 NOTE — Clinical Social Work Maternal (Addendum)
CLINICAL SOCIAL WORK MATERNAL/CHILD NOTE  Patient Details  Name: [name] [name] MRN: [id number] Date of Birth: 11/25/1985  Date:  2022-09-09  Clinical Social Worker Initiating Note:  Celso Sick[place], [place] Date/Time: Initiated:  2022/06/25/1217     Child's Name:  Doroteo Glassman   Biological Parents:  Mother, Father (Father: IT trainer)   Need for Interpreter:  None   Reason for Referral:  Behavioral Health Concerns, Other (Comment) (Infant's NICU Admission; history of postpartum depression)   Address:  [address] [place] [address]    Phone number:  [phone] (home)     Additional phone number:   Household Members/Support Persons (HM/SP):       HM/SP Name Relationship DOB or Age  HM/SP -1  Izibella Bergstrom daughter 02/21/09  HM/SP -2  Vevelyn Royals son 06/02/11  HM/SP -3 Tia Alert daughter 08/31/12  HM/SP -4 Adrianna Lawary daughter 11/04/13  HM/SP -5        HM/SP -6        HM/SP -7        HM/SP -8          Natural Supports (not living in the home):  Teacher, music Supports: None   Employment: Unemployed   Type of Work:     Education:  Some Materials engineer arranged:    Surveyor, quantity Resources:  [employer]   Other Resources:  Sales executive   (MOB reported that she missed her [facility] appointment; encouraged MOB to follow up with WIC if she is interested in receiving the resource.)   Cultural/Religious Considerations Which May Impact Care:    Strengths:  Ability to meet basic needs  , Optometrist chosen, Home prepared for child  , Understanding of illness   Psychotropic Medications:         Pediatrician:    Armed forces operational officer area  Pediatrician List:   [facility]  [place]    [place]      Pediatrician Fax Number:    Risk Factors/Current Problems:  Mental Health Concerns  , Intellectual Development Disorder     Cognitive State:  Able to  Concentrate  , Alert  , Goal Oriented  , Linear Thinking     Mood/Affect:  Calm  , Interested  , Overwhelmed     CSW Assessment: CSW met with parents at infant's bedside to complete psychosocial assessment. CSW introduced self and explained role. MOB was welcoming, pleasant, and remained engaged during assessment. FOB was pleasant and participated in assessment. FOB reported that MOB resides with her four older children. Parents reported having items needed to care for infant including a car seat and crib. CSW informed parents about [facility] [employer] [facility]'s [employer] if any assistance is needed obtaining items for infant. MOB reported that a referral for all essential items would be helpful. CSW agreed to complete referral. CSW inquired about MOB's support system, MOB reported that she has a good support system and noted that her neighbors are a big part of her support team.   CSW and parents discussed infant's NICU admission. CSW informed parents about the NICU, what to expect, and resources/supports available while infant is admitted to the NICU. Parents reported that they feel well informed about infant's care. MOB verbalized concerns about the lack of continuous supervision for infant and shared about infant spitting up. CSW reassured MOB that the RN provides close supervision and  care for infant. CSW agreed to update RN of MOB's concerns about infant's spit up. Parents denied any transportation barriers with visiting infant in the NICU. Parents denied any questions/concerns regarding the NICU. RN entered to care for infant, CSW updated RN of MOB's concerns.   CSW asked to speak with MOB privately, FOB left the room.   CSW inquired about MOB's mental health history. MOB reported that she experienced depressive symptoms during the pregnancy due to her inability to do things around the house like she wanted to. MOB reported that she participated in therapy at [facility] in the past which  was helpful. MOB reported that she is reconsidering returning. MOB endorsed a history of postpartum depression after having her first child. MOB acknowledged having stressors during that time. CSW inquired about how MOB was feeling emotionally since giving birth, MOB reported that she was feeling happy to see infant but also struggling due to infant's NICU admission. CSW acknowledged, validated, and normalized MOB's feelings. CSW and MOB discussed emotions/feelings that can arise during a NICU admission. MOB presented calm and did not demonstrate any acute mental health signs/symptoms. CSW assessed for safety, MOB denied SI, HI, and domestic violence.   CSW provided education regarding the baby blues period vs. perinatal mood disorders, discussed treatment and gave resources for mental health follow up if concerns arise.  CSW recommends self-evaluation during the postpartum time period using the New Mom Checklist from Postpartum Progress and encouraged MOB to contact a medical professional if symptoms are noted at any time.    CSW provided review of Sudden Infant Death Syndrome (SIDS) precautions.    CSW will continue to offer resources/supports while infant is admitted to the NICU as MOB opted for CSW to check in weekly.   CSW completed FSN referral for requested items.   CSW Plan/Description:  Sudden Infant Death Syndrome (SIDS) Education, Perinatal Mood and Anxiety Disorder (PMADs) Education, Psychosocial Support and Ongoing Assessment of Needs, Other Information/Referral to [facility], Other Patient/Family Education    Antionette Poles, LCSW 2022-11-13, 12:20 PM

## 2022-10-15 ENCOUNTER — Inpatient Hospital Stay (HOSPITAL_COMMUNITY)
Admission: AD | Admit: 2022-10-15 | Discharge: 2022-10-15 | Disposition: A | Discharge status: Home / Self Care | Payer: Medicare HMO | Attending: Obstetrics and Gynecology | Admitting: Obstetrics and Gynecology

## 2022-10-15 DIAGNOSIS — O9089 Other complications of the puerperium, not elsewhere classified: Secondary | ICD-10-CM | POA: Insufficient documentation

## 2022-10-15 DIAGNOSIS — N898 Other specified noninflammatory disorders of vagina: Secondary | ICD-10-CM | POA: Insufficient documentation

## 2022-10-15 LAB — RH IG WORKUP (INCLUDES ABO/RH)
Fetal Screen: NEGATIVE
Gestational Age(Wks): 38
Unit division: 0

## 2022-10-15 MED ORDER — MAGNESIUM HYDROXIDE 400 MG/5ML PO SUSP
30.0000 mL | Freq: Every day | ORAL | Status: DC
  Filled 2022-10-15: qty 30

## 2022-10-15 MED ORDER — IBUPROFEN 600 MG PO TABS: 600.0000 mg | ORAL_TABLET | Freq: Four times a day (QID) | ORAL | 0 refills | Status: DC

## 2022-10-15 NOTE — Lactation Note (Signed)
This note was copied from a baby's chart.  NICU Lactation Consultation Note  Patient Name: [name] [name] [id number] Date: 10/15/2022 Age:35 hours  Reason for consult: Follow-up assessment; Maternal discharge; Early term 20-38.6wks; Other (Comment); NICU baby; Infant < 6lbs (maternal intelectual disability due to fetal alcohol syndrome, DAT (+))  SUBJECTIVE Visited with family of 62 hours old ETI NICU female; Ms. Richard is a P5 but doesn't have experience breastfeeding. She reported her milk came in, she proudly showed LC a container with 25 ml from the fridge, praised her for her efforts. Ms. Olaes is getting discharge today. Reviewed discharge education and the importance of consistent pumping for the prevention of engorgement. Offered a [facility] loaner pump but she politely declined, provided a hand pump to take home. She voiced she'll be rooming in with baby after her discharge. Reviewed pumping schedule, pump settings, lactogenesis II and anticipatory guidelines.  OBJECTIVE Infant data: Mother's Current Feeding Choice: Breast Milk and Formula  O2 Device: Room Air  Infant feeding assessment Scale for Readiness: 3   Maternal data: [id number] Vaginal, Spontaneous Has patient been taught Hand Expression?: Yes Hand Expression Comments: dot of colostrum noted Current breast feeding challenges:: NICU admission Does the patient have breastfeeding experience prior to this delivery?: No How long did the patient breastfeed?: 0 Pumping frequency: 3 times/24 hours Pumped volume: 25 mL Flange Size: 21 Hands-free pumping top sizes: Small/Medium (Blue) Risk factor for low/delayed milk supply:: infant separation  WIC Program: Yes WIC Referral Sent?: Yes What county?: [place] Pump: Personal, Manual (DEBP per patient)  ASSESSMENT Infant: Feeding Status: Scheduled 9-12-3-6 Feeding method: Tube/Gavage (Bolus) Nipple Type: Nfant Standard Flow (white)  Maternal: Milk volume:  Normal  INTERVENTIONS/PLAN Interventions: Interventions: Breast feeding basics reviewed; DEBP; Education; Hand pump Discharge Education: Engorgement and breast care Tools: Pump; Flanges; Hands-free pumping top Pump Education: Setup, frequency, and cleaning; Milk Storage  Plan: Encouraged pumping every 3 hours, ideally 8 pumping sessions/24 hours She'll take all pump parts to baby's room after her discharge She'll change her pump settings from initiate to maintain mode  No other support person at this time. All questions and concerns answered, family to contact [facility] services PRN.  Consult Status: NICU follow-up NICU Follow-up type: Verify absence of engorgement   Shelsea Hangartner S Tamieka Rancourt 10/15/2022, 3:36 PM

## 2022-10-15 NOTE — Progress Notes (Signed)
PPD #2 Doing ok, some upper abdominal discomfort, baby stable in NICU Afeb, VSS Abd- fundus firm, mild distension Will give dose of MOM, d/c home today

## 2022-10-15 NOTE — Discharge Summary (Signed)
Postpartum Discharge Summary      Patient Name: [name] [name] DOB: March 15, 1985 MRN: [id number]  Date of admission: 10/13/2022 Delivery date:10/13/2022 Delivering provider: Carlisle Cater Date of discharge: 10/15/2022  Admitting diagnosis: Normal labor [O80, Z37.9] Intrauterine pregnancy: [redacted]w[redacted]d     Secondary diagnosis:  Active Problems:   * No active hospital problems. *     Discharge diagnosis: Term Pregnancy Delivered                                               Hospital course: Onset of Labor With Vaginal Delivery      37 y.o. yo [name] at [redacted]w[redacted]d was admitted in Active Labor on 10/13/2022. Labor course was complicated by nothing  Membrane Rupture Time/Date: 4:30 PM,10/13/2022  Delivery Method:Vaginal, Spontaneous Episiotomy: None Lacerations:  None Patient had a postpartum course complicated by nothing.  Baby in NICU for phototherapy.  She is ambulating, tolerating a regular diet, passing flatus, and urinating well. Patient is discharged home in stable condition on 10/15/22.  Newborn Data: Birth date:10/13/2022 Birth time:6:28 PM Gender:Female Living status:Living Apgars:8 ,9  Weight:2630 g   Immunizations administered: Immunization History  Administered Date(s) Administered   Rho (D) Immune Globulin 03/28/2011, 06/03/2011, 09/01/2012   Tdap 06/03/2011, 09/01/2012, 11/05/2013    Physical exam  Vitals:   10/14/22 1017 10/14/22 1500 10/14/22 1953 10/15/22 0545  BP: 108/76 104/66 124/80 104/65  Pulse: 83 74 60 71  Resp:   18 16  Temp:  98.5 F (36.9 C) 98.6 F (37 C) 98.5 F (36.9 C)  TempSrc:  Oral Oral Oral  SpO2: 99% 99% 100% 98%   General: alert Lochia: appropriate Uterine Fundus: firm  Labs: Lab Results  Component Value Date   WBC 16.4 (H) 10/14/2022   HGB 10.1 (L) 10/14/2022   HCT 31.0 (L) 10/14/2022   MCV 85.2 10/14/2022   PLT 275 10/14/2022      Latest Ref Rng & Units 01/06/2022   12:39 PM  CMP  Glucose 70 - 99 mg/dL 295   BUN 6 -  20 mg/dL 9   Creatinine 2.84 - 1.32 mg/dL 4.40   Sodium 102 - 725 mmol/L 137   Potassium 3.5 - 5.1 mmol/L 4.0   Chloride 98 - 111 mmol/L 105   CO2 22 - 32 mmol/L 25   Calcium 8.9 - 10.3 mg/dL 9.0   Total Protein 6.5 - 8.1 g/dL 7.3   Total Bilirubin 0.3 - 1.2 mg/dL 0.4   Alkaline Phos 38 - 126 U/L 56   AST 15 - 41 U/L 21   ALT 0 - 44 U/L 17    Edinburgh Score:     No data to display            After visit meds:  Allergies as of 10/15/2022       Reactions   Septra [sulfamethoxazole-trimethoprim] Hives   legs        Medication List     STOP taking these medications    promethazine-dextromethorphan 6.25-15 MG/5ML syrup Commonly known as: PROMETHAZINE-DM       TAKE these medications    acetaminophen 500 MG tablet Commonly known as: TYLENOL Take 1 tablet (500 mg total) by mouth every 6 (six) hours as needed.   albuterol 108 (90 Base) MCG/ACT inhaler Commonly known as: VENTOLIN HFA Inhale 2 puffs into the  lungs every 4 (four) hours as needed for wheezing or shortness of breath.   cyclobenzaprine 10 MG tablet Commonly known as: FLEXERIL Take 1 tablet (10 mg total) by mouth 2 (two) times daily as needed for muscle spasms.   fluticasone 50 MCG/ACT nasal spray Commonly known as: FLONASE Place 1 spray into both nostrils daily.   ibuprofen 600 MG tablet Commonly known as: ADVIL Take 1 tablet (600 mg total) by mouth every 6 (six) hours.   meclizine 25 MG tablet Commonly known as: ANTIVERT TAKE 1 TABLET (25 MG TOTAL) BY MOUTH ONCE AS NEEDED FOR UP TO 1 DOSE FOR DIZZINESS.   polyethylene glycol powder 17 GM/SCOOP powder Commonly known as: GLYCOLAX/MIRALAX Take 17 g by mouth daily.   prenatal multivitamin Tabs tablet Take 1 tablet by mouth daily at 12 noon.   promethazine 12.5 MG tablet Commonly known as: PHENERGAN Take 1-2 tablets (12.5-25 mg total) by mouth every 6 (six) hours as needed for nausea or vomiting.   rizatriptan 10 MG tablet Commonly known  as: Maxalt Take 1 tablet (10 mg total) by mouth as needed for migraine. May repeat in 2 hours if needed   venlafaxine XR 37.5 MG 24 hr capsule Commonly known as: EFFEXOR-XR TAKE 1 CAPSULE (37.5 MG TOTAL) BY MOUTH AT BEDTIME.         Discharge home in stable condition Infant Feeding: Bottle and Breast Infant Disposition:NICU Discharge instruction: per After Visit Summary and Postpartum booklet. Activity: Advance as tolerated. Pelvic rest for 6 weeks.  Diet: routine diet Postpartum Appointment:6 weeks Follow up Visit:  Follow-up Information     [facility]. Schedule an appointment as soon as possible for a visit in 6 week(s).   Contact information: [address]  [address] [place] [address] [phone]                     10/15/2022 Zenaida Niece, MD

## 2022-10-15 NOTE — Discharge Instructions (Signed)
As per discharge pamphlet °

## 2022-10-15 NOTE — MAU Note (Addendum)
HENRI KAZ is a 37 y.o. at  here in MAU reporting: was walking down the hall to the baby (in the NICU-jaundice), and this clot fell out of me.  Never seen anything so big.  Brought it with her.  Doing ok with pain, was having some when she breast feeds, but that seems better.  Bleeding hasn't been bad.   Onset of complaint: just happened Pain score: ok Vitals:   10/15/22 1844  BP: 128/81  Pulse: 75  Resp: 16  Temp: 98.1 F (36.7 C)  SpO2: 99%      Lab orders placed from triage:      Clot is not quite golfball sized

## 2022-10-19 ENCOUNTER — Ambulatory Visit (HOSPITAL_COMMUNITY): Payer: Self-pay

## 2022-10-19 NOTE — Lactation Note (Signed)
This note was copied from a baby's chart. Lactation Consultation Note  Patient Name: [name] [name] [id number] Date: 10/19/2022 Age:35 days Reason for consult: Follow-up assessment;NICU baby;Early term 37-38.6wks  Baby + DAT. Maternal Disability due to FAS.   P5, Baby [redacted]w[redacted]d.  < 6 lbs. SLP assisting mother with pumping. Mother had pumped 15 ml with 24 flanges on R breast.  LC called to room to assist with pumping due to mother complaining of tenderness in her chest/breast when pumping.  Provided education regarding milk let down and importance of regularly pumping to reduce discomfort.   Mother has been pumping approximately two times per day.  Reminded mother to pump q 3 hours.  Reviewed example times to pump 0300,0600,1200.    Assisted mother with pumping with 21 flanges with DEBP, both breasts.  Discussed keeping suction low (3 drops) if pump is uncomfortable for mother. During pumping session mother states she prefers to use hand pump in hospital instead of DEBP.  Mother demonstrated how she uses hand pump.  Recommend 10-15 minutes per breast if able.   Offered to send [facility] referral so mother can get DEBP at home.  Mother states she has DEBP at home, unknown brand.   Feeding Nipple Type: Dr. Levert Feinstein Preemie Lactation Tools Discussed/Used Tools: Pump;Flanges Flange Size: 21 Breast pump type: Double-Electric Breast Pump;Manual Reason for Pumping: NICU Pumping frequency:  (Mother has been pumping twice per day) Pumped volume: 25 mL  Interventions Interventions: Education;DEBP;Hand pump  Discharge Pump: Personal;DEBP  Consult Status Consult Status: Follow-up    Hardie Pulley  RN IBCLC 10/19/2022, 12:49 PM

## 2022-10-21 ENCOUNTER — Ambulatory Visit (HOSPITAL_COMMUNITY): Payer: Self-pay

## 2022-10-21 NOTE — Lactation Note (Signed)
This note was copied from a baby's chart.  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 is a P5 and reports she's been using the hand pump because she doesn't like the DEBP. Her supply continues to increase but unable to quantify due to inconsistent pumping. Re-educated about the importance of emptying the breast whether is with the baby's mouth or the pump for the prevention of engorgement. [name] is taking baby "[name] [name]" home today. Reviewed the importance of continuing pumping after feedings/attempts at the breast to protect her supply. She politely declined an LC OP F/U and voiced she'll be F/U with the local [facility] office. Female visitor present. All questions and concerns answered, family to contact [facility] services PRN.  OBJECTIVE Infant data: Mother's Current Feeding Choice: Breast Milk and Formula  O2 Device: Room Air  Infant feeding assessment Scale for Readiness: 2 Scale for Quality: 2   Maternal data: [id number] Vaginal, Spontaneous Pumping frequency: Not consistent, she's only pumping when her breasts feel "full" Pumped volume: 70 mL Flange Size: 21 Hands-free pumping top sizes: Small/Medium (Blue)  WIC Program: Yes WIC Referral Sent?: Yes What county?: [place] Pump: Personal, Manual (DEBP at home per patient)  ASSESSMENT Infant: Feeding Status: Ad lib Feeding method: Bottle Nipple Type: Dr. Levert Feinstein Preemie  Maternal: Milk volume: Low  INTERVENTIONS/PLAN Interventions: Interventions: Breast feeding basics reviewed; DEBP; Education; Hand pump Discharge Education: Outpatient recommendation Tools: Pump; Flanges; Hands-free pumping top Pump Education: Setup,  frequency, and cleaning; Milk Storage  Plan: Consult Status: Complete   Ozell Ferrera S Kaedyn Polivka 10/21/2022, 4:11 PM

## 2022-10-31 ENCOUNTER — Encounter (HOSPITAL_COMMUNITY): Payer: Medicare HMO

## 2022-11-01 ENCOUNTER — Encounter (HOSPITAL_COMMUNITY): Payer: Medicare HMO

## 2022-11-09 ENCOUNTER — Telehealth (HOSPITAL_COMMUNITY): Payer: Self-pay | Admitting: *Deleted

## 2022-11-09 NOTE — Telephone Encounter (Signed)
11/09/2022  Name: [name] [name] MRN: [id number] DOB: [birth year]  Reason for Call:  Transition of Care Hospital Discharge Call  Contact Status: Patient Contact Status: Complete  Language assistant needed:          Follow-Up Questions: Do You Have Any Concerns About Your Health As You Heal From Delivery?: No Do You Have Any Concerns About Your Infants Health?: No  Edinburgh Postnatal Depression Scale:  In the Past 7 Days: I have been able to laugh and see the funny side of things.: As much as I always could I have looked forward with enjoyment to things.: Rather less than I used to I have blamed myself unnecessarily when things went wrong.: Yes, some of the time I have been anxious or worried for no good reason.: Yes, sometimes I have felt scared or panicky for no good reason.: Yes, sometimes Things have been getting on top of me.: Yes, sometimes I haven't been coping as well as usual I have been so unhappy that I have had difficulty sleeping.: Not at all I have felt sad or miserable.: No, not at all I have been so unhappy that I have been crying.: No, never The thought of harming myself has occurred to me.: Never Edinburgh Postnatal Depression Scale Total: 9  PHQ2-9 Depression Scale:     Discharge Follow-up: Edinburgh score requires follow up?: No Patient was advised of the following resources:: Breastfeeding Support Group, Support Group  Post-discharge interventions: Reviewed Newborn Safe Sleep Practices  Signature Deforest Hoyles, RN, 11/09/22, [phone]

## 2022-12-06 ENCOUNTER — Encounter (HOSPITAL_BASED_OUTPATIENT_CLINIC_OR_DEPARTMENT_OTHER): Payer: Self-pay | Admitting: Obstetrics and Gynecology

## 2022-12-06 NOTE — Progress Notes (Signed)
Spoke w/ via phone for pre-op interview--- [facility] needs dos----UPT per anesthesia. Surgeon orders pending.         Lab results------ COVID test -----patient states asymptomatic no test needed Arrive at ------- NPO after MN NO Solid Food.  Clear liquids from MN until---0845 Med rec completed Medications to take morning of surgery -----NONE Diabetic medication ----- Patient instructed no nail polish to be worn day of surgery Patient instructed to bring photo id and insurance card day of surgery Patient aware to have Driver (ride ) / caregiver    for 24 hours after surgery - Neighbor-Patricia Patient Special Instructions ----- Pre-Op special Instructions ----- Patient verbalized understanding of instructions that were given at this phone interview. Patient denies chest pain, sob, fever, cough at the interview.

## 2022-12-13 ENCOUNTER — Ambulatory Visit (HOSPITAL_BASED_OUTPATIENT_CLINIC_OR_DEPARTMENT_OTHER)
Admission: RE | Admit: 2022-12-13 | Discharge: 2022-12-13 | Disposition: A | Discharge status: Home / Self Care | Payer: Medicare HMO | Attending: Obstetrics and Gynecology | Admitting: Obstetrics and Gynecology

## 2022-12-13 ENCOUNTER — Ambulatory Visit (HOSPITAL_BASED_OUTPATIENT_CLINIC_OR_DEPARTMENT_OTHER): Payer: Medicare HMO | Admitting: Anesthesiology

## 2022-12-13 ENCOUNTER — Encounter (HOSPITAL_BASED_OUTPATIENT_CLINIC_OR_DEPARTMENT_OTHER): Payer: Self-pay | Admitting: Obstetrics and Gynecology

## 2022-12-13 ENCOUNTER — Other Ambulatory Visit: Payer: Self-pay

## 2022-12-13 ENCOUNTER — Encounter (HOSPITAL_BASED_OUTPATIENT_CLINIC_OR_DEPARTMENT_OTHER)
Admission: RE | Disposition: A | Discharge status: Home / Self Care | Payer: Self-pay | Source: Home / Self Care | Attending: Obstetrics and Gynecology

## 2022-12-13 DIAGNOSIS — Z01818 Encounter for other preprocedural examination: Secondary | ICD-10-CM

## 2022-12-13 DIAGNOSIS — Z302 Encounter for sterilization: Secondary | ICD-10-CM | POA: Diagnosis not present

## 2022-12-13 DIAGNOSIS — F79 Unspecified intellectual disabilities: Secondary | ICD-10-CM | POA: Insufficient documentation

## 2022-12-13 DIAGNOSIS — F17210 Nicotine dependence, cigarettes, uncomplicated: Secondary | ICD-10-CM | POA: Insufficient documentation

## 2022-12-13 DIAGNOSIS — Z3009 Encounter for other general counseling and advice on contraception: Secondary | ICD-10-CM

## 2022-12-13 HISTORY — PX: LAPAROSCOPIC BILATERAL SALPINGECTOMY: SHX5889

## 2022-12-13 HISTORY — DX: Herpesviral infection of other urogenital tract: A60.09

## 2022-12-13 LAB — POCT PREGNANCY, URINE: Preg Test, Ur: NEGATIVE

## 2022-12-13 SURGERY — SALPINGECTOMY, BILATERAL, LAPAROSCOPIC
Anesthesia: General | Site: Abdomen | Laterality: Bilateral

## 2022-12-13 MED ORDER — OXYCODONE HCL 5 MG PO TABS
ORAL_TABLET | ORAL | Status: AC
  Filled 2022-12-13: qty 1

## 2022-12-13 MED ORDER — MIDAZOLAM HCL 2 MG/2ML IJ SOLN
INTRAMUSCULAR | Status: DC | PRN
  Administered 2022-12-13: 1 mg via INTRAVENOUS

## 2022-12-13 MED ORDER — ACETAMINOPHEN 500 MG PO TABS
ORAL_TABLET | ORAL | Status: AC
  Filled 2022-12-13: qty 2

## 2022-12-13 MED ORDER — LIDOCAINE HCL (CARDIAC) PF 100 MG/5ML IV SOSY
PREFILLED_SYRINGE | INTRAVENOUS | Status: DC | PRN
  Administered 2022-12-13: 50 mg via INTRAVENOUS

## 2022-12-13 MED ORDER — LACTATED RINGERS IV SOLN: INTRAVENOUS | Status: DC

## 2022-12-13 MED ORDER — PROPOFOL 10 MG/ML IV BOLUS
INTRAVENOUS | Status: DC | PRN
  Administered 2022-12-13: 140 mg via INTRAVENOUS

## 2022-12-13 MED ORDER — ONDANSETRON HCL 4 MG/2ML IJ SOLN
INTRAMUSCULAR | Status: DC | PRN
  Administered 2022-12-13: 4 mg via INTRAVENOUS

## 2022-12-13 MED ORDER — GLYCOPYRROLATE 0.2 MG/ML IJ SOLN
INTRAMUSCULAR | Status: DC | PRN
  Administered 2022-12-13 (×2): .1 mg via INTRAVENOUS

## 2022-12-13 MED ORDER — FENTANYL CITRATE (PF) 100 MCG/2ML IJ SOLN
INTRAMUSCULAR | Status: AC
  Filled 2022-12-13: qty 2

## 2022-12-13 MED ORDER — DROPERIDOL 2.5 MG/ML IJ SOLN: 0.6250 mg | Freq: Once | INTRAMUSCULAR | Status: DC | PRN

## 2022-12-13 MED ORDER — FENTANYL CITRATE (PF) 100 MCG/2ML IJ SOLN
INTRAMUSCULAR | Status: DC | PRN
  Administered 2022-12-13: 50 ug via INTRAVENOUS

## 2022-12-13 MED ORDER — SUGAMMADEX SODIUM 200 MG/2ML IV SOLN
INTRAVENOUS | Status: DC | PRN
  Administered 2022-12-13: 200 mg via INTRAVENOUS

## 2022-12-13 MED ORDER — BUPIVACAINE HCL (PF) 0.25 % IJ SOLN
INTRAMUSCULAR | Status: AC
  Filled 2022-12-13: qty 30

## 2022-12-13 MED ORDER — SODIUM CHLORIDE 0.9 % IR SOLN
Status: DC | PRN
  Administered 2022-12-13: 500 mL

## 2022-12-13 MED ORDER — DEXMEDETOMIDINE HCL IN NACL 80 MCG/20ML IV SOLN
INTRAVENOUS | Status: DC | PRN
  Administered 2022-12-13: 4 ug via INTRAVENOUS

## 2022-12-13 MED ORDER — POVIDONE-IODINE 10 % EX SWAB: 2.0000 | Freq: Once | CUTANEOUS | Status: DC

## 2022-12-13 MED ORDER — ROCURONIUM BROMIDE 100 MG/10ML IV SOLN
INTRAVENOUS | Status: DC | PRN
  Administered 2022-12-13: 50 mg via INTRAVENOUS

## 2022-12-13 MED ORDER — OXYCODONE HCL 5 MG/5ML PO SOLN: 5.0000 mg | Freq: Once | ORAL | Status: AC | PRN

## 2022-12-13 MED ORDER — ACETAMINOPHEN 500 MG PO TABS
1000.0000 mg | ORAL_TABLET | Freq: Once | ORAL | Status: AC
  Administered 2022-12-13: 1000 mg via ORAL

## 2022-12-13 MED ORDER — OXYCODONE HCL 5 MG PO TABS
5.0000 mg | ORAL_TABLET | Freq: Once | ORAL | Status: AC | PRN
  Administered 2022-12-13: 5 mg via ORAL

## 2022-12-13 MED ORDER — BUPIVACAINE HCL (PF) 0.25 % IJ SOLN
INTRAMUSCULAR | Status: DC | PRN
  Administered 2022-12-13: 7 mL

## 2022-12-13 MED ORDER — FENTANYL CITRATE (PF) 100 MCG/2ML IJ SOLN: 25.0000 ug | INTRAMUSCULAR | Status: DC | PRN

## 2022-12-13 MED ORDER — DEXAMETHASONE SODIUM PHOSPHATE 4 MG/ML IJ SOLN
INTRAMUSCULAR | Status: DC | PRN
  Administered 2022-12-13: 8 mg via INTRAVENOUS

## 2022-12-13 MED ORDER — MIDAZOLAM HCL 2 MG/2ML IJ SOLN
INTRAMUSCULAR | Status: AC
  Filled 2022-12-13: qty 2

## 2022-12-13 MED ORDER — SODIUM CHLORIDE 0.9 % IV SOLN: INTRAVENOUS | Status: DC

## 2022-12-13 SURGICAL SUPPLY — 38 items
DERMABOND ADVANCED .7 DNX12 (GAUZE/BANDAGES/DRESSINGS) ×1 IMPLANT
DRAPE SURG IRRIG POUCH 19X23 (DRAPES) ×1 IMPLANT
DRSG OPSITE POSTOP 3X4 (GAUZE/BANDAGES/DRESSINGS) IMPLANT
DURAPREP 26ML APPLICATOR (WOUND CARE) ×1 IMPLANT
GAUZE 4X4 16PLY ~~LOC~~+RFID DBL (SPONGE) ×1 IMPLANT
GLOVE BIOGEL PI IND STRL 6.5 (GLOVE) ×2 IMPLANT
GLOVE BIOGEL PI IND STRL 7.5 (GLOVE) IMPLANT
GLOVE ECLIPSE 6.5 STRL STRAW (GLOVE) ×1 IMPLANT
GOWN STRL REUS W/TWL LRG LVL3 (GOWN DISPOSABLE) ×2 IMPLANT
IRRIG SUCT STRYKERFLOW 2 WTIP (MISCELLANEOUS) ×1
IRRIGATION SUCT STRKRFLW 2 WTP (MISCELLANEOUS) ×1 IMPLANT
IV NS 500ML BAXH (IV SOLUTION) IMPLANT
KIT PINK PAD W/HEAD ARE REST (MISCELLANEOUS) ×1
KIT PINK PAD W/HEAD ARM REST (MISCELLANEOUS) ×1 IMPLANT
KIT TURNOVER CYSTO (KITS) ×1 IMPLANT
NDL INSUFFLATION 14GA 120MM (NEEDLE) ×1 IMPLANT
NEEDLE INSUFFLATION 14GA 120MM (NEEDLE) ×1
NS IRRIG 1000ML POUR BTL (IV SOLUTION) ×1 IMPLANT
NS IRRIG 500ML POUR BTL (IV SOLUTION) IMPLANT
PACK LAPAROSCOPY BASIN (CUSTOM PROCEDURE TRAY) ×1 IMPLANT
POUCH LAPAROSCOPIC INSTRUMENT (MISCELLANEOUS) ×1 IMPLANT
PROTECTOR NERVE ULNAR (MISCELLANEOUS) ×2 IMPLANT
SCISSORS LAP 5X35 DISP (ENDOMECHANICALS) IMPLANT
SET TUBE SMOKE EVAC HIGH FLOW (TUBING) ×1 IMPLANT
SHEARS HARMONIC 36 ACE (MISCELLANEOUS) IMPLANT
SLEEVE SCD COMPRESS KNEE MED (STOCKING) ×1 IMPLANT
SLEEVE Z-THREAD 5X100MM (TROCAR) IMPLANT
SUT VIC AB 3-0 PS2 18XBRD (SUTURE) ×1 IMPLANT
SUT VICRYL 0 UR6 27IN ABS (SUTURE) ×1 IMPLANT
SYS BAG RETRIEVAL 10MM (BASKET)
SYS RETRIEVAL 5MM INZII UNIV (BASKET)
SYSTEM BAG RETRIEVAL 10MM (BASKET) IMPLANT
SYSTEM RETRIEVL 5MM INZII UNIV (BASKET) IMPLANT
TOWEL OR 17X24 6PK STRL BLUE (TOWEL DISPOSABLE) ×1 IMPLANT
TRAY FOLEY W/BAG SLVR 14FR LF (SET/KITS/TRAYS/PACK) ×1 IMPLANT
TROCAR Z-THREAD FIOS 11X100 BL (TROCAR) IMPLANT
TROCAR Z-THREAD FIOS 5X100MM (TROCAR) ×2 IMPLANT
WARMER LAPAROSCOPE (MISCELLANEOUS) ×1 IMPLANT

## 2022-12-13 NOTE — Op Note (Signed)
Date: 12/13/22  Preoperative Diagnosis: Satisfied fertility Postoperative Diagnosis: same as above Anesthesia: GETA by Dr Nance Pew Findings: On external exam, no significant findings. Preoperative BME reveals 7cm anteverted uterus with BL adnexa WNL. Specimens: bilateral fallopian tubes EBL <50cc IVF 400 UOP voided prior to procedure  Indications: [name] [name] Is a 37yo [id number] with satisfied fertility. Counseled on non-permanent contraception options as listed below. Patient elects for permanent method via BS.  Consent:  Dicussed with patient R/B/A of bilateral salpingectomy. Alternatives includes internal LARCs. Discussed R/B or surgical procedure including bleeding, infection, injury to surrounding organs. Patient agrees to blood products if needed. Discussed that BS does not protect against STI and that barrier protection should be used. Patient understands and intends to comply.    Operative Procedure Patient was taken to the operating room where general anesthesia was obtained without difficulty. She was then prepped and draped in the normal sterile fashion in the dorsal lithotomy position. An appropriate timeout was performed. A speculum was then placed within the vagina and a sponge stick placed within the cervix for uterine manipulation. Attention was then turned to the patient's abdomen after draping where the infraumbilical area was injected with approximately 10 cc of quarter percent Marcaine. A 1 cm incision was then made within the umbilicus and a 5mm trochar was then inserted under direct visualization. Gas flow was then applied and a pneumoperitoneum obtained with approximate 3 L of CO2 gas. With patient in Trendelenburg the uterus and tubes and ovaries were inspected with findings as previously stated. 2 additional 5mm ports were placed in LLQ and RLQ by palpating 2cm above and 2cm medial to corresponding ASIS, transilluminating during Marcaine injeciton and port placement. A Harmonic  was then introduced through the LLQ port and the left fallopian tube was removed via transection of mesosalpinx. Attention was then turned to the right fallopian tube where similar process was repeated. Bilateral tubes were removed through corresponding 5mm ports. The remainder of the pelvis and abdomen were inspected and found to be normal. The instruments were removed from the abdomen and the pneumoperitoneum reduced through the trocar. The trocar was finally removed and the infraumbilical incision closed with a subcuticular stitch of 3-0 Vicryl. Dermabond was placed. Patient was then awakened and taken to the recovery room in good condition.

## 2022-12-13 NOTE — Transfer of Care (Signed)
Immediate Anesthesia Transfer of Care Note  Patient: [name] [name]  Procedure(s) Performed: LAPAROSCOPIC BILATERAL SALPINGECTOMY (Bilateral: Abdomen)  Patient Location: PACU  Anesthesia Type:General  Level of Consciousness: awake, alert , oriented, and patient cooperative  Airway & Oxygen Therapy: Patient Spontanous Breathing and Patient connected to nasal cannula oxygen  Post-op Assessment: Report given to RN and Post -op Vital signs reviewed and stable  Post vital signs: Reviewed and stable  Last Vitals:  Vitals Value Taken Time  BP 121/68 12/13/22 1303  Temp    Pulse 94 12/13/22 1305  Resp 18 12/13/22 1305  SpO2 95 % 12/13/22 1305  Vitals shown include unfiled device data.  Last Pain:  Vitals:   12/13/22 0950  TempSrc: Oral  PainSc: 9       Patients Stated Pain Goal: 4 (12/13/22 0950)  Complications: No notable events documented.

## 2022-12-13 NOTE — Anesthesia Postprocedure Evaluation (Signed)
Anesthesia Post Note  Patient: [name] [name]  Procedure(s) Performed: LAPAROSCOPIC BILATERAL SALPINGECTOMY (Bilateral: Abdomen)     Patient location during evaluation: PACU Anesthesia Type: General Level of consciousness: awake and alert Pain management: pain level controlled Vital Signs Assessment: post-procedure vital signs reviewed and stable Respiratory status: spontaneous breathing, nonlabored ventilation, respiratory function stable and patient connected to nasal cannula oxygen Cardiovascular status: blood pressure returned to baseline and stable Postop Assessment: no apparent nausea or vomiting Anesthetic complications: no   No notable events documented.  Last Vitals:  Vitals:   12/13/22 1330 12/13/22 1345  BP: 114/73 105/78  Pulse: 64 65  Resp: 13 13  Temp:    SpO2: 97% 93%    Last Pain:  Vitals:   12/13/22 1330  TempSrc:   PainSc: 4                  Marely Apgar P Macdonald Rigor

## 2022-12-13 NOTE — Discharge Instructions (Signed)
     No acetaminophen/Tylenol until after 4:00 pm today if needed.   Post Anesthesia Home Care Instructions  Activity: Get plenty of rest for the remainder of the day. A responsible individual must stay with you for 24 hours following the procedure.  For the next 24 hours, DO NOT: -Drive a car -Paediatric nurse -Drink alcoholic beverages -Take any medication unless instructed by your physician -Make any legal decisions or sign important papers.  Meals: Start with liquid foods such as gelatin or soup. Progress to regular foods as tolerated. Avoid greasy, spicy, heavy foods. If nausea and/or vomiting occur, drink only clear liquids until the nausea and/or vomiting subsides. Call your physician if vomiting continues.  Special Instructions/Symptoms: Your throat may feel dry or sore from the anesthesia or the breathing tube placed in your throat during surgery. If this causes discomfort, gargle with warm salt water. The discomfort should disappear within 24 hours.

## 2022-12-13 NOTE — Anesthesia Procedure Notes (Signed)
Procedure Name: Intubation Date/Time: 12/13/2022 12:10 PM  Performed by: Earmon Phoenix, CRNAPre-anesthesia Checklist: Patient identified, Emergency Drugs available, Suction available, Patient being monitored and Timeout performed Patient Re-evaluated:Patient Re-evaluated prior to induction Oxygen Delivery Method: Circle system utilized Preoxygenation: Pre-oxygenation with 100% oxygen Induction Type: IV induction Ventilation: Mask ventilation without difficulty Laryngoscope Size: Mac and 3 Grade View: Grade I Tube type: Oral Tube size: 7.0 mm Number of attempts: 1 Airway Equipment and Method: Stylet Placement Confirmation: positive ETCO2 and breath sounds checked- equal and bilateral Secured at: 19 cm Tube secured with: Tape Dental Injury: Teeth and Oropharynx as per pre-operative assessment

## 2022-12-13 NOTE — Anesthesia Preprocedure Evaluation (Addendum)
Anesthesia Evaluation  Patient identified by MRN, date of birth, ID band Patient awake    Reviewed: Allergy & Precautions, NPO status , Patient's Chart, lab work & pertinent test results  Airway Mallampati: II  TM Distance: >3 FB Neck ROM: Full    Dental no notable dental hx.    Pulmonary Current Smoker and Patient abstained from smoking.   Pulmonary exam normal        Cardiovascular negative cardio ROS  Rhythm:Regular Rate:Normal     Neuro/Psych  Headaches   Depression       GI/Hepatic negative GI ROS, Neg liver ROS,,,  Endo/Other  negative endocrine ROS    Renal/GU negative Renal ROS  Female GU complaint     Musculoskeletal negative musculoskeletal ROS (+)    Abdominal Normal abdominal exam  (+)   Peds  Hematology negative hematology ROS (+)   Anesthesia Other Findings   Reproductive/Obstetrics                             Anesthesia Physical Anesthesia Plan  ASA: 3  Anesthesia Plan: General   Post-op Pain Management: Tylenol PO (pre-op)*   Induction: Intravenous  PONV Risk Score and Plan: 2 and Ondansetron, Dexamethasone, Midazolam and Treatment may vary due to age or medical condition  Airway Management Planned: Mask and Oral ETT  Additional Equipment: None  Intra-op Plan:   Post-operative Plan: Extubation in OR  Informed Consent: I have reviewed the patients History and Physical, chart, labs and discussed the procedure including the risks, benefits and alternatives for the proposed anesthesia with the patient or authorized representative who has indicated his/her understanding and acceptance.     Dental advisory given  Plan Discussed with: CRNA  Anesthesia Plan Comments:        Anesthesia Quick Evaluation

## 2022-12-13 NOTE — Interval H&P Note (Signed)
History and Physical Interval Note:  12/13/2022 11:46 AM  [name] [name]  has presented today for surgery, with the diagnosis of sterilization Z30.2.  The various methods of treatment have been discussed with the patient and family. After consideration of risks, benefits and other options for treatment, the patient has consented to  Procedure(s): LAPAROSCOPIC BILATERAL SALPINGECTOMY (Bilateral) as a surgical intervention.  The patient's history has been reviewed, patient examined, no change in status, stable for surgery.  I have reviewed the patient's chart and labs.  Questions were answered to the patient's satisfaction.    Will avoid ibuprofen given swelling history. Reviewed postop heat/ES tylenol scheduled Galileah Piggee M Jontay Maston

## 2022-12-13 NOTE — H&P (Signed)
[name] [name] is an 37 y.o. female presenting for scheduled procedure  Pertinent Gynecological History: Menses:  appropriate postpartum lochia Contraception: none DES exposure: denies Blood transfusions: none Sexually transmitted diseases: past history: HSV, chlamydia Previous GYN Procedures: DNC  Last pap: normal Date: 03/29/22 OB History: G6, P5015   Menstrual History: Menarche age: early teens No LMP recorded (lmp unknown).    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   Herpes genitalis in women    History of chlamydia    Migraine     Past Surgical History:  Procedure Laterality Date   APPENDECTOMY     DILATION AND EVACUATION N/A 07/02/2021   Procedure: DILATATION AND EVACUATION CHROMASOMAL STUDIES;  Surgeon: Carlisle Cater, MD;  Location: [facility];  Service: Gynecology;  Laterality: N/A;   EYE SURGERY     TYMPANOSTOMY TUBE PLACEMENT     WISDOM TOOTH EXTRACTION      Family History  Problem Relation Age of Onset   Alcohol abuse Mother    Seizures Father    Alcohol abuse Father     Social History:  reports that she has been smoking cigarettes. She has never used smokeless tobacco. She reports that she does not currently use alcohol. She reports that she does not currently use drugs.  Allergies:  Allergies  Allergen Reactions   Ibuprofen Itching and Swelling    Face swelled   Septra [Sulfamethoxazole-Trimethoprim] Hives    legs   Tomato Itching    Mouth and all over itching.    No medications prior to admission.    Review of Systems  Constitutional:  Negative for chills and fever.  Respiratory:  Negative for shortness of breath.   Cardiovascular:  Negative for chest pain, palpitations and leg swelling.  Gastrointestinal:  Negative for abdominal pain, nausea and vomiting.  Neurological:  Negative for dizziness, weakness and headaches.   Psychiatric/Behavioral:  Negative for suicidal ideas.     Height 5\' 3"  (1.6 m), weight 63.5 kg, not currently breastfeeding. Physical Exam Constitutional General Appearance: healthy-appearing, well-nourished, well-developed  Cardiovascular Auscultation: RRR, no murmur Peripheral Vascular: no varicosities, no edema  Lungs Respiratory Effort: no accessory muscle usage, no intercostal retractions Auscultation: clear to auscultation, no wheezing, no rales/crackles, no rhonchi Inspection: normal, normal respiratory rate  Abdomen Inspection/Palpation/Auscultation: soft, non-distended, no tenderness, no hepatomegaly, no splenomegaly, abdomen: incision laparoscopy (umbilical RUQ (prior appy)) Hernia: none palpated Inspection of Incision Site(s) no erythema, no drainage, no separation  Extremities Legs: normal Arms: normal Pulses: normal  Neurological System Impressions motor: no deficits, sensory: no deficits  Psychiatric Orientation: to person, to place, to time Mood and Affect: active and alert, normal mood, normal affect No results found for this or any previous visit (from the past 24 hour(s)).  No results found.  Assessment/Plan: This is a 37yo [id number] with satisfied fertility 2 months s/p SVD> Unable to complete postpartum BTL given time of delivery, desires bilateral salpingectomy at this time. Patient understands fully that this means she would not be able to spontaneously conceive. Dicussed with patient R/B/A of bilateral salpingectomy. Alternatives includes internal LARCs. Discussed R/B or surgical procedure including bleeding, infection, injury to surrounding organs. Patient agrees to blood products if needed. Discussed that BS does not protect against STI and that barrier protection should be used. Patient understands and intends to comply.    Valerie Roys Reginaldo Hazard 12/13/2022, 5:18 AM

## 2022-12-14 LAB — SURGICAL PATHOLOGY

## 2022-12-15 ENCOUNTER — Encounter (HOSPITAL_BASED_OUTPATIENT_CLINIC_OR_DEPARTMENT_OTHER): Payer: Self-pay | Admitting: Obstetrics and Gynecology

## 2023-07-23 ENCOUNTER — Encounter (HOSPITAL_COMMUNITY): Payer: Self-pay | Admitting: Emergency Medicine

## 2023-07-23 ENCOUNTER — Emergency Department (HOSPITAL_COMMUNITY)
Admission: EM | Admit: 2023-07-23 | Discharge: 2023-07-23 | Disposition: A | Discharge status: Home / Self Care | Source: Home / Self Care

## 2023-07-23 ENCOUNTER — Emergency Department (HOSPITAL_COMMUNITY)
Admission: EM | Admit: 2023-07-23 | Discharge: 2023-07-23 | Disposition: A | Discharge status: Home / Self Care | Attending: Emergency Medicine | Admitting: Emergency Medicine

## 2023-07-23 ENCOUNTER — Other Ambulatory Visit: Payer: Self-pay

## 2023-07-23 DIAGNOSIS — F17200 Nicotine dependence, unspecified, uncomplicated: Secondary | ICD-10-CM | POA: Insufficient documentation

## 2023-07-23 DIAGNOSIS — R220 Localized swelling, mass and lump, head: Secondary | ICD-10-CM | POA: Insufficient documentation

## 2023-07-23 DIAGNOSIS — H9202 Otalgia, left ear: Secondary | ICD-10-CM | POA: Diagnosis not present

## 2023-07-23 DIAGNOSIS — H9203 Otalgia, bilateral: Secondary | ICD-10-CM | POA: Insufficient documentation

## 2023-07-23 DIAGNOSIS — D72829 Elevated white blood cell count, unspecified: Secondary | ICD-10-CM | POA: Insufficient documentation

## 2023-07-23 DIAGNOSIS — J029 Acute pharyngitis, unspecified: Secondary | ICD-10-CM | POA: Insufficient documentation

## 2023-07-23 DIAGNOSIS — J019 Acute sinusitis, unspecified: Secondary | ICD-10-CM

## 2023-07-23 DIAGNOSIS — R1310 Dysphagia, unspecified: Secondary | ICD-10-CM | POA: Insufficient documentation

## 2023-07-23 LAB — CBC WITH DIFFERENTIAL/PLATELET
Abs Immature Granulocytes: 0.04 10*3/uL (ref 0.00–0.07)
Basophils Absolute: 0.1 10*3/uL (ref 0.0–0.1)
Basophils Relative: 0 %
Eosinophils Absolute: 0.1 10*3/uL (ref 0.0–0.5)
Eosinophils Relative: 1 %
HCT: 38.5 % (ref 36.0–46.0)
Hemoglobin: 12.6 g/dL (ref 12.0–15.0)
Immature Granulocytes: 0 %
Lymphocytes Relative: 14 %
Lymphs Abs: 2 10*3/uL (ref 0.7–4.0)
MCH: 28.8 pg (ref 26.0–34.0)
MCHC: 32.7 g/dL (ref 30.0–36.0)
MCV: 87.9 fL (ref 80.0–100.0)
Monocytes Absolute: 0.9 10*3/uL (ref 0.1–1.0)
Monocytes Relative: 7 %
Neutro Abs: 10.7 10*3/uL — ABNORMAL HIGH (ref 1.7–7.7)
Neutrophils Relative %: 78 %
Platelets: 337 10*3/uL (ref 150–400)
RBC: 4.38 MIL/uL (ref 3.87–5.11)
RDW: 13.1 % (ref 11.5–15.5)
WBC: 13.8 10*3/uL — ABNORMAL HIGH (ref 4.0–10.5)
nRBC: 0 % (ref 0.0–0.2)

## 2023-07-23 LAB — BASIC METABOLIC PANEL WITH GFR
Anion gap: 11 (ref 5–15)
BUN: 9 mg/dL (ref 6–20)
CO2: 23 mmol/L (ref 22–32)
Calcium: 8.8 mg/dL — ABNORMAL LOW (ref 8.9–10.3)
Chloride: 104 mmol/L (ref 98–111)
Creatinine, Ser: 1 mg/dL (ref 0.44–1.00)
GFR, Estimated: >60 mL/min (ref >60)
Glucose, Bld: 91 mg/dL (ref 70–99)
Potassium: 4 mmol/L (ref 3.5–5.1)
Sodium: 138 mmol/L (ref 135–145)

## 2023-07-23 LAB — GROUP A STREP BY PCR: Group A Strep by PCR: NOT DETECTED

## 2023-07-23 MED ORDER — AMOXICILLIN 500 MG PO CAPS: 500.0000 mg | ORAL_CAPSULE | Freq: Three times a day (TID) | ORAL | 0 refills | Status: DC

## 2023-07-23 MED ORDER — ACETAMINOPHEN 325 MG PO TABS
650.0000 mg | ORAL_TABLET | Freq: Once | ORAL | Status: AC
  Administered 2023-07-23: 650 mg via ORAL
  Filled 2023-07-23: qty 2

## 2023-07-23 MED ORDER — DEXAMETHASONE SODIUM PHOSPHATE 10 MG/ML IJ SOLN
10.0000 mg | Freq: Once | INTRAMUSCULAR | Status: AC
  Administered 2023-07-23: 10 mg via INTRAMUSCULAR
  Filled 2023-07-23: qty 1

## 2023-07-23 NOTE — ED Triage Notes (Signed)
 Pt reports severe ear pain.  Seen earlier at [facility] she reports and was given amoxicillin . Reports someone maybe didn't look right because something is going on. I am not getting better  Pt reports taking amoxicillin  about 4pm today.

## 2023-07-23 NOTE — ED Provider Notes (Signed)
 [place] EMERGENCY DEPARTMENT AT [facility] Provider Note   CSN: [id number] Arrival date & time: 07/23/23  1925     Patient presents with: Otalgia   [name] [name] is a 38 y.o. female.  Patient with past medical history significant for migraines, TMJ dislocation presents to the emergency department complaining of bilateral ear pain and sore throat for the past 4 days.  She was seen earlier this morning at [facility] for the same complaint and was prescribed amoxicillin .  She reports taking the first dose at 4 PM and is concerned because she has not improved.  She denies changes in voice, difficulty swallowing, fever, nausea, vomiting.    Otalgia      Prior to Admission medications   Medication Sig Start Date End Date Taking? Authorizing Provider  acetaminophen  (TYLENOL ) 500 MG tablet Take 1 tablet (500 mg total) by mouth every 6 (six) hours as needed. 09/05/21   Charlyn Sora, MD  albuterol  (VENTOLIN  HFA) 108 (90 Base) MCG/ACT inhaler Inhale 2 puffs into the lungs every 4 (four) hours as needed for wheezing or shortness of breath. 01/07/21   White, Shelba SAUNDERS, NP  amoxicillin  (AMOXIL ) 500 MG capsule Take 1 capsule (500 mg total) by mouth 3 (three) times daily. 07/23/23   Myriam Dorn BROCKS, PA  fluticasone  (FLONASE ) 50 MCG/ACT nasal spray Place 1 spray into both nostrils daily. 07/03/18   Adah Corning A, FNP  meclizine  (ANTIVERT ) 25 MG tablet TAKE 1 TABLET (25 MG TOTAL) BY MOUTH ONCE AS NEEDED FOR UP TO 1 DOSE FOR DIZZINESS. 02/07/22   Gayland Lauraine PARAS, NP  polyethylene glycol powder (GLYCOLAX /MIRALAX ) 17 GM/SCOOP powder Take 17 g by mouth daily. 04/11/22   Emilio Delilah HERO, CNM  rizatriptan  (MAXALT ) 10 MG tablet Take 1 tablet (10 mg total) by mouth as needed for migraine. May repeat in 2 hours if needed 01/06/22   Gayland Lauraine PARAS, NP  valACYclovir (VALTREX) 500 MG tablet Take 500 mg by mouth 2 (two) times daily as needed.    [provider]    Allergies:  Ibuprofen , Septra [sulfamethoxazole-trimethoprim], and Tomato    Review of Systems  HENT:  Positive for ear pain.     Updated Vital Signs BP 118/74 (BP Location: Left Arm)   Pulse 92   Temp 99.5 F (37.5 C) (Oral)   Resp 18   SpO2 94%   Physical Exam Vitals and nursing note reviewed.  Constitutional:      General: She is not in acute distress.    Appearance: Normal appearance.  HENT:     Head: Normocephalic and atraumatic.     Right Ear: Hearing, tympanic membrane, ear canal and external ear normal.     Left Ear: Hearing, tympanic membrane, ear canal and external ear normal.     Nose: Mucosal edema and congestion present. No septal deviation or nasal tenderness.     Right Nostril: No occlusion.     Left Nostril: No occlusion.     Right Turbinates: Enlarged and swollen.     Left Turbinates: Enlarged and swollen.     Mouth/Throat:     Mouth: Mucous membranes are moist.     Pharynx: Oropharynx is clear. Posterior oropharyngeal erythema (Mildly erythematous posterior oropharynx) present.   Eyes:     Extraocular Movements: Extraocular movements intact.     Conjunctiva/sclera: Conjunctivae normal.     Pupils: Pupils are equal, round, and reactive to light.    Cardiovascular:     Rate and Rhythm:  Normal rate and regular rhythm.  Pulmonary:     Effort: Pulmonary effort is normal.     Breath sounds: Normal breath sounds.  Abdominal:     General: Bowel sounds are normal.   Musculoskeletal:     Cervical back: Normal range of motion and neck supple.  Lymphadenopathy:     Cervical: Cervical adenopathy present.     Right cervical: Superficial cervical adenopathy present.     Left cervical: Superficial cervical adenopathy present.     Comments: Tender lymphadenopathy bilaterally at the submandibular space.   Skin:    General: Skin is warm and dry.     Capillary Refill: Capillary refill takes less than 2 seconds.   Neurological:     General: No focal deficit present.      Mental Status: She is alert. Mental status is at baseline.   Psychiatric:        Mood and Affect: Mood normal.     (all labs ordered are listed, but only abnormal results are displayed) Labs Reviewed - No data to display  EKG: None  Radiology: No results found.   Procedures   Medications Ordered in the ED  acetaminophen  (TYLENOL ) tablet 650 mg (650 mg Oral Given 07/23/23 2242)  dexamethasone  (DECADRON ) injection 10 mg (10 mg Intramuscular Given 07/23/23 2243)                                    Medical Decision Making Risk OTC drugs. Prescription drug management.   This patient presents to the ED for concern of sore throat and otalgia, this involves an extensive number of treatment options, and is a complaint that carries with it a high risk of complications and morbidity.  The differential diagnosis includes strep throat, viral pharyngitis, otitis media, otitis externa, PTA, others   Co morbidities / Chronic conditions that complicate the patient evaluation  History of migraines   Additional history obtained:  Additional history obtained from EMR    Lab Tests:  Negative group A strep test this morning   Imaging Studies ordered:  The patient has no tonsillar swelling, uvula is midline, normal phonation.  I see no indication at this time for emergent CT soft tissue neck     Problem List / ED Course / Critical interventions / Medication management   I ordered medication including Decadron , Tylenol  Reevaluation of the patient after these medicines showed that the patient improved I have reviewed the patients home medicines and have made adjustments as needed   Social Determinants of Health:  Patient smokes tobacco on some days   Test / Admission - Considered:  Patient's bilateral TMs appear normal.  No sign of otitis media no tenderness to suggest otitis externa.  Patient had a negative strep test this morning.  No tonsillar exudate appreciated.   Mild erythema noted in the posterior oropharynx.  No clinical signs or history to suggest PTA.  I do not think she needs emergent imaging at this time.  She was prescribed amoxicillin  earlier today and I do feel it is reasonable to continue with this.  That being said this does appear to likely be viral in nature.  No splenomegaly appreciated on exam.  Very low suspicion of mono.   patient was treated symptomatically with a shot of Decadron  and Tylenol .  I have recommended that the patient alternate Tylenol  and ibuprofen  at home for pain control.  Plan to discharge home  with return precautions at this time.      Final diagnoses:  Pharyngitis, unspecified etiology    ED Discharge Orders     None          Logan Ubaldo KATHEE DEVONNA 07/23/23 2255    Albertina Dixon, MD 07/24/23 [phone]

## 2023-07-23 NOTE — Discharge Instructions (Addendum)
 Please continue to take the earlier prescribed antibiotics.  I do recommend that you take Tylenol  alternated with ibuprofen .  You may take up to 650 mg of Tylenol  followed 3 hours later by up to 600 mg of ibuprofen  and continue to alternate every 3 hours.  Do not exceed 4000 mg of Tylenol  in a 24-hour person.  Follow-up as new with your primary care provider.  If you develop any life-threatening symptoms return to the emergency department.

## 2023-07-23 NOTE — Discharge Instructions (Signed)
 As it shows that you are allergic to ibuprofen , would suggest that you continue to take Tylenol  every 6-8 hours as needed for pain, do warm salt water gargles as needed for throat pain and use throat lozenges as needed as well.  You been provided with a prescription for amoxicillin , please take this entire course, and please schedule a follow-up with your primary care physician within the next week to 2 weeks for monitoring of your progress.  Return to the emergency room should your symptoms become worse or you develop new concerning symptoms prior to your ability to be seen by your primary care.

## 2023-07-23 NOTE — ED Triage Notes (Signed)
 Per [facility] pt c/o sore throat, left ear pain and swelling to left side of face x 4 days. Given 25 mg of benadryl  by ems. Patient states she isn't sure if she got bit by something or if its her allergies for having the ceiling fan on.

## 2023-07-23 NOTE — ED Provider Notes (Signed)
 [place] EMERGENCY DEPARTMENT AT [facility] Provider Note   CSN: [id number] Arrival date & time: 07/23/23  9149     Patient presents with: Sore Throat and Otalgia   [name] [name] is a 38 y.o. female who presents to the ED brought in by EMS for complaints of sore throat, left-sided otalgia, and swelling to the left side of the face.  She was given 25 mg of Benadryl  by EMS prior to arrival at the ED.  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  Symptoms have been present over the last 4 days and have been getting progressively worse over that time.  She does endorse odynophagia as well as dysphagia, but has been tolerating oral intake both solid and liquid.  Denies any nausea or vomiting.      Sore Throat  Otalgia Associated symptoms: rhinorrhea        Prior to Admission medications   Medication Sig Start Date End Date Taking? Authorizing Provider  acetaminophen  (TYLENOL ) 500 MG tablet Take 1 tablet (500 mg total) by mouth every 6 (six) hours as needed. 09/05/21   Charlyn Sora, MD  albuterol  (VENTOLIN  HFA) 108 (90 Base) MCG/ACT inhaler Inhale 2 puffs into the lungs every 4 (four) hours as needed for wheezing or shortness of breath. 01/07/21   Teresa Shelba SAUNDERS, NP  fluticasone  (FLONASE ) 50 MCG/ACT nasal spray Place 1 spray into both nostrils daily. 07/03/18   Bast, Wilbert A, FNP  meclizine  (ANTIVERT ) 25 MG tablet TAKE 1 TABLET (25 MG TOTAL) BY MOUTH ONCE AS NEEDED FOR UP TO 1 DOSE FOR DIZZINESS. 02/07/22   Gayland Lauraine PARAS, NP  polyethylene glycol powder (GLYCOLAX /MIRALAX ) 17 GM/SCOOP powder Take 17 g by mouth daily. 04/11/22   Emilio Delilah HERO, CNM  rizatriptan  (MAXALT ) 10 MG tablet Take 1 tablet (10 mg total) by mouth as needed for migraine. May repeat in 2 hours if needed 01/06/22   Gayland Lauraine PARAS, NP  valACYclovir (VALTREX) 500 MG tablet Take 500 mg by mouth 2 (two) times daily as needed.    [provider]    Allergies:  Ibuprofen , Septra [sulfamethoxazole-trimethoprim], and Tomato    Review of Systems  HENT:  Positive for ear pain, postnasal drip and rhinorrhea. Negative for drooling.   All other systems reviewed and are negative.   Updated Vital Signs BP 118/74   Pulse 95   Temp 98.3 F (36.8 C)   Resp 16   Ht 5' 3 (1.6 m)   Wt 65.8 kg   SpO2 98% Comment: Simultaneous filing. User may not have seen previous data.  BMI 25.69 kg/m   Physical Exam Vitals and nursing note reviewed.  Constitutional:      General: She is not in acute distress.    Appearance: Normal appearance.  HENT:     Head: Normocephalic and atraumatic.     Right Ear: Hearing, tympanic membrane, ear canal and external ear normal.     Left Ear: Hearing, tympanic membrane, ear canal and external ear normal.     Nose: Mucosal edema and congestion present. No septal deviation or nasal tenderness.     Right Nostril: No occlusion.     Left Nostril: No occlusion.     Right Turbinates: Enlarged and swollen.     Left Turbinates: Enlarged and swollen.     Mouth/Throat:     Mouth: Mucous membranes are moist.     Pharynx: Oropharynx is clear.   Eyes:  Extraocular Movements: Extraocular movements intact.     Conjunctiva/sclera: Conjunctivae normal.     Pupils: Pupils are equal, round, and reactive to light.    Cardiovascular:     Rate and Rhythm: Normal rate and regular rhythm.     Pulses: Normal pulses.     Heart sounds: Normal heart sounds. No murmur heard.    No friction rub. No gallop.  Pulmonary:     Effort: Pulmonary effort is normal.     Breath sounds: Normal breath sounds.  Abdominal:     General: Abdomen is flat. Bowel sounds are normal.     Palpations: Abdomen is soft.   Musculoskeletal:        General: Normal range of motion.     Cervical back: Normal range of motion and neck supple.     Right lower leg: No edema.     Left lower leg: No edema.  Lymphadenopathy:     Cervical: Cervical adenopathy present.      Right cervical: Superficial cervical adenopathy present.     Left cervical: Superficial cervical adenopathy present.     Comments: Tender lymphadenopathy bilaterally at the submandibular space.   Skin:    General: Skin is warm and dry.     Capillary Refill: Capillary refill takes less than 2 seconds.   Neurological:     General: No focal deficit present.     Mental Status: She is alert. Mental status is at baseline.   Psychiatric:        Mood and Affect: Mood normal.     (all labs ordered are listed, but only abnormal results are displayed) Labs Reviewed  GROUP A STREP BY PCR    EKG: None  Radiology: No results found.   Procedures   Medications Ordered in the ED - No data to display                                  Medical Decision Making Amount and/or Complexity of Data Reviewed Labs: ordered.  Risk Prescription drug management.   Medical Decision Making:   [name] [name] is a 38 y.o. female who presented to the ED today with sore throat and left-sided otalgia detailed above.     Complete initial physical exam performed, notably the patient  was alert and oriented in no apparent distress.  Physical exam as noted, notable findings as of no notable edema of the posterior oropharynx, copious postnasal drip as well as clear mucus production in the nose, swollen erythematous turbinates bilaterally..    Reviewed and confirmed nursing documentation for past medical history, family history, social history.    Initial Assessment:   With the patient's presentation of sore throat and otalgia, most likely diagnosis is viral illness. Other diagnoses were considered including (but not limited to) streptococcal pharyngitis, mononucleosis. These are considered less likely due to history of present illness and physical exam findings.     Initial Plan:  Obtain oropharyngeal strep swab Screening labs including CBC and Metabolic panel to evaluate for infectious or  metabolic etiology of disease.  Objective evaluation as below reviewed   Initial Study Results:   Laboratory  All laboratory results reviewed without evidence of clinically relevant pathology.   Exceptions include: Leukocytosis of 13.8     Reassessment and Plan:   On reevaluation of this patient, they continue to have left-sided otalgia, right sided maxillary facial tenderness, as well as continued sore throat  and odynophagia.  Evaluation of labs show a leukocytosis of 13.8, and strep swab is negative.  As such, will begin treatment for possible sinusitis, pharyngitis with amoxicillin , and encourage patient to use Tylenol  as needed for pain along with throat lozenges, patient is allergic to ibuprofen  and NSAIDs.  Patient to follow-up with primary care within 1 to 2 weeks.       Final diagnoses:  None    ED Discharge Orders     None          Myriam Dorn BROCKS, [place] 07/23/23 1239    Charlyn Sora, MD 07/25/23 [phone]

## 2024-01-20 ENCOUNTER — Other Ambulatory Visit: Payer: Self-pay

## 2024-01-20 ENCOUNTER — Emergency Department (HOSPITAL_COMMUNITY)
Admission: EM | Admit: 2024-01-20 | Discharge: 2024-01-20 | Disposition: A | Discharge status: Home / Self Care | Attending: Emergency Medicine | Admitting: Emergency Medicine

## 2024-01-20 DIAGNOSIS — J101 Influenza due to other identified influenza virus with other respiratory manifestations: Secondary | ICD-10-CM | POA: Diagnosis not present

## 2024-01-20 DIAGNOSIS — R059 Cough, unspecified: Secondary | ICD-10-CM | POA: Diagnosis present

## 2024-01-20 DIAGNOSIS — J40 Bronchitis, not specified as acute or chronic: Secondary | ICD-10-CM

## 2024-01-20 LAB — RESP PANEL BY RT-PCR (RSV, FLU A&B, COVID)  RVPGX2
Influenza A by PCR: POSITIVE — AB
Influenza B by PCR: NEGATIVE
Resp Syncytial Virus by PCR: NEGATIVE
SARS Coronavirus 2 by RT PCR: NEGATIVE

## 2024-01-20 MED ORDER — ALBUTEROL SULFATE HFA 108 (90 BASE) MCG/ACT IN AERS
2.0000 | INHALATION_SPRAY | Freq: Once | RESPIRATORY_TRACT | Status: AC
  Administered 2024-01-20: 2 via RESPIRATORY_TRACT
  Filled 2024-01-20: qty 6.7

## 2024-01-20 MED ORDER — ONDANSETRON 4 MG PO TBDP: ORAL_TABLET | ORAL | 0 refills | Status: AC

## 2024-01-20 NOTE — Discharge Instructions (Addendum)
 You have influenza A mild bronchitis  Please take Tylenol  Motrin  for fever  Take Zofran  for nausea  Please use albuterol  2 puffs every 4 hours as needed for cough  See your doctor for follow-up  Return to ER if you have worse cough or trouble breathing

## 2024-01-20 NOTE — ED Notes (Signed)
..  Patient is A&Ox4 upon discharge. Patient verbalized understanding of prescriptions, discharge instructions and follow-up care. Patient ambulatory from ED with steady gait.

## 2024-01-20 NOTE — ED Provider Notes (Signed)
 " [name] [name] Provider Note   CSN: [id number] Arrival date & time: 01/20/24  2017     Patient presents with: URI   [name] [name] is a 38 y.o. female who presenting with flulike symptoms for the last 5 days.  She states that she has been running fever and has nonproductive cough.  Patient has a history of bronchitis but denies any productive cough.  Denies any history of asthma.  Patient states that her daughter is sick with similar symptoms   The history is provided by the patient.       Prior to Admission medications  Medication Sig Start Date End Date Taking? Authorizing Provider  acetaminophen  (TYLENOL ) 500 MG tablet Take 1 tablet (500 mg total) by mouth every 6 (six) hours as needed. 09/05/21   Charlyn Sora, MD  albuterol  (VENTOLIN  HFA) 108 (90 Base) MCG/ACT inhaler Inhale 2 puffs into the lungs every 4 (four) hours as needed for wheezing or shortness of breath. 01/07/21   White, Shelba SAUNDERS, NP  amoxicillin  (AMOXIL ) 500 MG capsule Take 1 capsule (500 mg total) by mouth 3 (three) times daily. 07/23/23   Myriam Dorn BROCKS, PA  fluticasone  (FLONASE ) 50 MCG/ACT nasal spray Place 1 spray into both nostrils daily. 07/03/18   Bast, Wilbert A, FNP  meclizine  (ANTIVERT ) 25 MG tablet TAKE 1 TABLET (25 MG TOTAL) BY MOUTH ONCE AS NEEDED FOR UP TO 1 DOSE FOR DIZZINESS. 02/07/22   Gayland Lauraine PARAS, NP  polyethylene glycol powder (GLYCOLAX /MIRALAX ) 17 GM/SCOOP powder Take 17 g by mouth daily. 04/11/22   Emilio Delilah HERO, CNM  rizatriptan  (MAXALT ) 10 MG tablet Take 1 tablet (10 mg total) by mouth as needed for migraine. May repeat in 2 hours if needed 01/06/22   Gayland Lauraine PARAS, NP  valACYclovir (VALTREX) 500 MG tablet Take 500 mg by mouth 2 (two) times daily as needed.    [provider]    Allergies: Ibuprofen , Septra [sulfamethoxazole-trimethoprim], and Tomato    Review of Systems  Respiratory:  Positive for cough.   All other systems  reviewed and are negative.   Updated Vital Signs BP 118/76 (BP Location: Right Arm)   Pulse 87   Temp 99.5 F (37.5 C) (Oral)   Resp 17   SpO2 98%   Physical Exam Vitals and nursing note reviewed.  Constitutional:      Appearance: Normal appearance.  HENT:     Head: Normocephalic.     Nose: Nose normal.     Mouth/Throat:     Mouth: Mucous membranes are moist.  Eyes:     Extraocular Movements: Extraocular movements intact.     Pupils: Pupils are equal, round, and reactive to light.  Cardiovascular:     Rate and Rhythm: Normal rate and regular rhythm.     Pulses: Normal pulses.  Pulmonary:     Comments: Minimal wheezing no crackle Abdominal:     General: Abdomen is flat.     Palpations: Abdomen is soft.  Musculoskeletal:        General: Normal range of motion.     Cervical back: Normal range of motion.  Skin:    General: Skin is warm.     Capillary Refill: Capillary refill takes less than 2 seconds.  Neurological:     General: No focal deficit present.     Mental Status: She is alert.  Psychiatric:        Mood and Affect: Mood normal.     (  all labs ordered are listed, but only abnormal results are displayed) Labs Reviewed  RESP PANEL BY RT-PCR (RSV, FLU A&B, COVID)  RVPGX2 - Abnormal; Notable for the following components:      Result Value   Influenza A by PCR POSITIVE (*)    All other components within normal limits    EKG: None  Radiology: No results found.   Procedures   Medications Ordered in the ED  albuterol  (VENTOLIN  HFA) 108 (90 Base) MCG/ACT inhaler 2 puff (has no administration in time range)                                    Medical Decision Making [name] [name] is a 38 y.o. female here presenting with cough and fever.  Patient is flu a positive.  Patient has minimal wheezing but no crackles.  I do not think she has pneumonia and right now.  She is outside the window for Tamiflu.  Will discharge home with albuterol  as  needed   Problems Addressed: Influenza A: acute illness or injury  Risk Prescription drug management.     Final diagnoses:  None    ED Discharge Orders     None          Patt Alm Macho, MD 01/20/24 2214  "

## 2024-01-20 NOTE — ED Triage Notes (Signed)
 Pt BIB [facility] coming from home with c/o flu-like symptoms for 5 days. N/V.   EMS: LR 130/80 4mg  Zofran 

## 2024-02-29 ENCOUNTER — Other Ambulatory Visit: Payer: Self-pay

## 2024-02-29 ENCOUNTER — Emergency Department (HOSPITAL_COMMUNITY)

## 2024-02-29 ENCOUNTER — Encounter (HOSPITAL_COMMUNITY): Payer: Self-pay | Admitting: *Deleted

## 2024-02-29 ENCOUNTER — Emergency Department (HOSPITAL_COMMUNITY)
Admission: EM | Admit: 2024-02-29 | Discharge: 2024-02-29 | Disposition: A | Discharge status: Home / Self Care | Source: Home / Self Care | Attending: Emergency Medicine | Admitting: Emergency Medicine

## 2024-02-29 DIAGNOSIS — K529 Noninfective gastroenteritis and colitis, unspecified: Secondary | ICD-10-CM

## 2024-02-29 DIAGNOSIS — E860 Dehydration: Secondary | ICD-10-CM

## 2024-02-29 DIAGNOSIS — S8001XA Contusion of right knee, initial encounter: Secondary | ICD-10-CM

## 2024-02-29 DIAGNOSIS — W010XXA Fall on same level from slipping, tripping and stumbling without subsequent striking against object, initial encounter: Secondary | ICD-10-CM

## 2024-02-29 DIAGNOSIS — R112 Nausea with vomiting, unspecified: Secondary | ICD-10-CM

## 2024-02-29 LAB — CBC WITH DIFFERENTIAL/PLATELET
Abs Immature Granulocytes: 0.1 10*3/uL — ABNORMAL HIGH (ref 0.00–0.07)
Basophils Absolute: 0 10*3/uL (ref 0.0–0.1)
Basophils Relative: 0 %
Eosinophils Absolute: 0 10*3/uL (ref 0.0–0.5)
Eosinophils Relative: 0 %
HCT: 42.1 % (ref 36.0–46.0)
Hemoglobin: 13.8 g/dL (ref 12.0–15.0)
Immature Granulocytes: 1 %
Lymphocytes Relative: 10 %
Lymphs Abs: 1.9 10*3/uL (ref 0.7–4.0)
MCH: 29.4 pg (ref 26.0–34.0)
MCHC: 32.8 g/dL (ref 30.0–36.0)
MCV: 89.8 fL (ref 80.0–100.0)
Monocytes Absolute: 0.5 10*3/uL (ref 0.1–1.0)
Monocytes Relative: 3 %
Neutro Abs: 15.6 10*3/uL — ABNORMAL HIGH (ref 1.7–7.7)
Neutrophils Relative %: 86 %
Platelets: 356 10*3/uL (ref 150–400)
RBC: 4.69 MIL/uL (ref 3.87–5.11)
RDW: 14 % (ref 11.5–15.5)
WBC: 18.1 10*3/uL — ABNORMAL HIGH (ref 4.0–10.5)
nRBC: 0 % (ref 0.0–0.2)

## 2024-02-29 LAB — COMPREHENSIVE METABOLIC PANEL WITH GFR
ALT: 16 U/L (ref 0–44)
AST: 17 U/L (ref 15–41)
Albumin: 4.3 g/dL (ref 3.5–5.0)
Alkaline Phosphatase: 72 U/L (ref 38–126)
Anion gap: 11 (ref 5–15)
BUN: 12 mg/dL (ref 6–20)
CO2: 25 mmol/L (ref 22–32)
Calcium: 9.1 mg/dL (ref 8.9–10.3)
Chloride: 100 mmol/L (ref 98–111)
Creatinine, Ser: 0.85 mg/dL (ref 0.44–1.00)
GFR, Estimated: >60 mL/min
Glucose, Bld: 146 mg/dL — ABNORMAL HIGH (ref 70–99)
Potassium: 4 mmol/L (ref 3.5–5.1)
Sodium: 136 mmol/L (ref 135–145)
Total Bilirubin: 0.6 mg/dL (ref 0.0–1.2)
Total Protein: 7.7 g/dL (ref 6.5–8.1)

## 2024-02-29 LAB — HCG, SERUM, QUALITATIVE: Preg, Serum: NEGATIVE

## 2024-02-29 LAB — LIPASE, BLOOD: Lipase: 20 U/L (ref 11–51)

## 2024-02-29 MED ORDER — MORPHINE SULFATE (PF) 2 MG/ML IV SOLN
4.0000 mg | Freq: Once | INTRAVENOUS | Status: AC
  Administered 2024-02-29: 4 mg via INTRAVENOUS
  Filled 2024-02-29: qty 2

## 2024-02-29 MED ORDER — AMOXICILLIN-POT CLAVULANATE 875-125 MG PO TABS: 1.0000 | ORAL_TABLET | Freq: Two times a day (BID) | ORAL | 0 refills | Status: AC

## 2024-02-29 MED ORDER — ONDANSETRON HCL 4 MG/2ML IJ SOLN
4.0000 mg | Freq: Once | INTRAMUSCULAR | Status: AC
  Administered 2024-02-29: 4 mg via INTRAVENOUS
  Filled 2024-02-29: qty 2

## 2024-02-29 MED ORDER — IOHEXOL 350 MG/ML SOLN
65.0000 mL | Freq: Once | INTRAVENOUS | Status: AC | PRN
  Administered 2024-02-29: 65 mL via INTRAVENOUS

## 2024-02-29 MED ORDER — PIPERACILLIN-TAZOBACTAM 3.375 G IVPB 30 MIN
3.3750 g | Freq: Once | INTRAVENOUS | Status: AC
  Administered 2024-02-29: 3.375 g via INTRAVENOUS
  Filled 2024-02-29: qty 50

## 2024-02-29 MED ORDER — LACTATED RINGERS IV BOLUS
1000.0000 mL | Freq: Once | INTRAVENOUS | Status: AC
  Administered 2024-02-29: 1000 mL via INTRAVENOUS

## 2024-02-29 MED ORDER — FENTANYL CITRATE (PF) 50 MCG/ML IJ SOSY
25.0000 ug | PREFILLED_SYRINGE | Freq: Once | INTRAMUSCULAR | Status: AC
  Administered 2024-02-29: 25 ug via INTRAVENOUS
  Filled 2024-02-29: qty 1

## 2024-02-29 NOTE — ED Triage Notes (Signed)
 BIB [facility] from home for abd pain, onset 5d ago. Endorses NVD onset yesterday. Vx 50, and Dx10 in last 24 hrs. Denies bleeding, urinary or vaginal sx. Reports decreased PO intake, and occasional vertigo, h/o same. Mentions fall a few days ago with R knee and hip pain. VSS. CBG 208, no h/o DM. H/o appy and tubal ligation. Alert, NAD, calm, interactive, resps e/u, speaking in clear complete sentences.

## 2024-02-29 NOTE — ED Notes (Signed)
 Encouraged to stay

## 2024-02-29 NOTE — ED Notes (Addendum)
 BEVERAGES OFFERED. Passed PTO challenged

## 2024-02-29 NOTE — ED Provider Triage Note (Signed)
 Emergency Medicine Provider Triage Evaluation Note  [name] [name] , a 39 y.o. female  was evaluated in triage.  Pt complains of abdominal pain nausea and vomiting that began last night around 1 AM.  Review of Systems  Positive: Right sided abdominal pain nausea and vomiting Negative: Fever chills status post bilateral tubal ligation  Physical Exam  BP 111/74   Pulse (!) 103   Temp 99.1 F (37.3 C) (Oral)   Resp 18   Wt 65.8 kg   SpO2 99%   BMI 25.69 kg/m  Gen:   Awake, no distress   Resp:  Normal effort  MSK:   Moves extremities without difficulty complaining of knee pain and back pain Other:  Abdomen soft with tenderness throughout the right side of abdomen  Medical Decision Making  Medically screening exam initiated at 9:54 AM.  Appropriate orders placed.  [name] [name] was informed that the remainder of the evaluation will be completed by another provider, this initial triage assessment does not replace that evaluation, and the importance of remaining in the ED until their evaluation is complete.  Will place order for pregnancy test, labs, imaging and pain medicines   Levander Houston, MD 02/29/24 [phone]

## 2024-02-29 NOTE — ED Provider Notes (Signed)
 " [place] [facility] AT [facility] Provider Note   CSN: [id number] Arrival date & time: 02/29/24  0909     Patient presents with: Abdominal Pain   [name] [name] is a 39 y.o. female.   Pt c/o nausea, vomiting, diarrhea in past day. Several episodes. Denies bloody or bilious emesis. Diarrhea loose to watery, not bloody. Indicates abd cramping, generalized, waxes/wanes, without specific exacerbating or alleviating factors. Denies known ill contacts or bad food ingestion. No recent change in meds or antibiotic use. No travel. No fever or chills (although temp in ED 100.1). denies dysuria or gu c/o. No vaginal discharge or bleeding. Also indicates 1 week ago slipped on ice with contusion to right knee. Has been ambulatory since. No lacs or abrasions.   The history is provided by the patient, medical records and the EMS personnel.  Abdominal Pain Associated symptoms: diarrhea, nausea and vomiting   Associated symptoms: no chest pain, no chills, no cough, no dysuria, no fever, no shortness of breath, no sore throat, no vaginal bleeding and no vaginal discharge        Prior to Admission medications  Medication Sig Start Date End Date Taking? Authorizing Provider  acetaminophen  (TYLENOL ) 500 MG tablet Take 1 tablet (500 mg total) by mouth every 6 (six) hours as needed. 09/05/21   Charlyn Sora, MD  albuterol  (VENTOLIN  HFA) 108 (90 Base) MCG/ACT inhaler Inhale 2 puffs into the lungs every 4 (four) hours as needed for wheezing or shortness of breath. 01/07/21   White, Shelba SAUNDERS, NP  amoxicillin  (AMOXIL ) 500 MG capsule Take 1 capsule (500 mg total) by mouth 3 (three) times daily. 07/23/23   Myriam Dorn BROCKS, PA  fluticasone  (FLONASE ) 50 MCG/ACT nasal spray Place 1 spray into both nostrils daily. 07/03/18   Bast, Wilbert A, FNP  meclizine  (ANTIVERT ) 25 MG tablet TAKE 1 TABLET (25 MG TOTAL) BY MOUTH ONCE AS NEEDED FOR UP TO 1 DOSE FOR DIZZINESS. 02/07/22   Gayland Lauraine PARAS, NP   ondansetron  (ZOFRAN -ODT) 4 MG disintegrating tablet 4mg  ODT q4 hours prn nausea/vomit 01/20/24   Yao, David Hsienta, MD  polyethylene glycol powder (GLYCOLAX /MIRALAX ) 17 GM/SCOOP powder Take 17 g by mouth daily. 04/11/22   Emilio Delilah HERO, CNM  rizatriptan  (MAXALT ) 10 MG tablet Take 1 tablet (10 mg total) by mouth as needed for migraine. May repeat in 2 hours if needed 01/06/22   Gayland Lauraine PARAS, NP  valACYclovir (VALTREX) 500 MG tablet Take 500 mg by mouth 2 (two) times daily as needed.    [provider]    Allergies: Ibuprofen , Septra [sulfamethoxazole-trimethoprim], and Tomato    Review of Systems  Constitutional:  Negative for chills and fever.  HENT:  Negative for sore throat.   Respiratory:  Negative for cough and shortness of breath.   Cardiovascular:  Negative for chest pain.  Gastrointestinal:  Positive for abdominal pain, diarrhea, nausea and vomiting. Negative for blood in stool.  Genitourinary:  Negative for dysuria, flank pain, vaginal bleeding and vaginal discharge.  Musculoskeletal:  Negative for back pain and neck pain.  Neurological:  Negative for headaches.    Updated Vital Signs BP 119/73 (BP Location: Right Arm)   Pulse 85   Temp 99.1 F (37.3 C) (Oral)   Resp 18   Wt 65.8 kg   SpO2 96%   BMI 25.69 kg/m   Physical Exam Vitals and nursing note reviewed.  Constitutional:      Appearance: Normal appearance. She is well-developed.  HENT:     Head: Atraumatic.     Nose: Nose normal.     Mouth/Throat:     Mouth: Mucous membranes are moist.  Eyes:     General: No scleral icterus.    Conjunctiva/sclera: Conjunctivae normal.     Pupils: Pupils are equal, round, and reactive to light.  Neck:     Trachea: No tracheal deviation.  Cardiovascular:     Rate and Rhythm: Regular rhythm. Tachycardia present.     Pulses: Normal pulses.     Heart sounds: Normal heart sounds. No murmur heard.    No friction rub. No gallop.  Pulmonary:     Effort:  Pulmonary effort is normal. No respiratory distress.     Breath sounds: Normal breath sounds.  Abdominal:     General: Bowel sounds are normal. There is no distension.     Palpations: Abdomen is soft. There is no mass.     Tenderness: There is no abdominal tenderness. There is no guarding or rebound.     Hernia: No hernia is present.  Genitourinary:    Comments: No cva tenderness.  Musculoskeletal:        General: No swelling.     Cervical back: Normal range of motion and neck supple. No rigidity. No muscular tenderness.     Comments: Very mild tenderness right knee anteriorly. No swelling noted. No effusion. Knee stable. Distal pulses palp. No other extremity pain. CTLS spine, non tender, aligned, no step off.   Skin:    General: Skin is warm and dry.     Findings: No rash.  Neurological:     Mental Status: She is alert.     Comments: Alert, speech normal. Motor/sens grossly intact bil. Steady gait.   Psychiatric:        Mood and Affect: Mood normal.     (all labs ordered are listed, but only abnormal results are displayed) Results for orders placed or performed during the hospital encounter of 02/29/24  CBC with Differential   Collection Time: 02/29/24 10:22 AM  Result Value Ref Range   WBC 18.1 (H) 4.0 - 10.5 K/uL   RBC 4.69 3.87 - 5.11 MIL/uL   Hemoglobin 13.8 12.0 - 15.0 g/dL   HCT 57.8 63.9 - 53.9 %   MCV 89.8 80.0 - 100.0 fL   MCH 29.4 26.0 - 34.0 pg   MCHC 32.8 30.0 - 36.0 g/dL   RDW 85.9 88.4 - 84.4 %   Platelets 356 150 - 400 K/uL   nRBC 0.0 0.0 - 0.2 %   Neutrophils Relative % 86 %   Neutro Abs 15.6 (H) 1.7 - 7.7 K/uL   Lymphocytes Relative 10 %   Lymphs Abs 1.9 0.7 - 4.0 K/uL   Monocytes Relative 3 %   Monocytes Absolute 0.5 0.1 - 1.0 K/uL   Eosinophils Relative 0 %   Eosinophils Absolute 0.0 0.0 - 0.5 K/uL   Basophils Relative 0 %   Basophils Absolute 0.0 0.0 - 0.1 K/uL   Immature Granulocytes 1 %   Abs Immature Granulocytes 0.10 (H) 0.00 - 0.07 K/uL   hCG, serum, qualitative   Collection Time: 02/29/24 10:22 AM  Result Value Ref Range   Preg, Serum NEGATIVE NEGATIVE  Comprehensive metabolic panel   Collection Time: 02/29/24 10:22 AM  Result Value Ref Range   Sodium 136 135 - 145 mmol/L   Potassium 4.0 3.5 - 5.1 mmol/L   Chloride 100 98 - 111 mmol/L   CO2 25 22 -  32 mmol/L   Glucose, Bld 146 (H) 70 - 99 mg/dL   BUN 12 6 - 20 mg/dL   Creatinine, Ser 9.14 0.44 - 1.00 mg/dL   Calcium 9.1 8.9 - 89.6 mg/dL   Total Protein 7.7 6.5 - 8.1 g/dL   Albumin 4.3 3.5 - 5.0 g/dL   AST 17 15 - 41 U/L   ALT 16 0 - 44 U/L   Alkaline Phosphatase 72 38 - 126 U/L   Total Bilirubin 0.6 0.0 - 1.2 mg/dL   GFR, Estimated >39 >39 mL/min   Anion gap 11 5 - 15  Lipase, blood   Collection Time: 02/29/24 10:22 AM  Result Value Ref Range   Lipase 20 11 - 51 U/L   CT ABDOMEN PELVIS W CONTRAST Result Date: 02/29/2024 CLINICAL DATA:  Abdominal pain and nausea. EXAM: CT ABDOMEN AND PELVIS WITH CONTRAST TECHNIQUE: Multidetector CT imaging of the abdomen and pelvis was performed using the standard protocol following bolus administration of intravenous contrast. RADIATION DOSE REDUCTION: This exam was performed according to the departmental dose-optimization program which includes automated exposure control, adjustment of the mA and/or kV according to patient size and/or use of iterative reconstruction technique. CONTRAST:  65mL OMNIPAQUE  IOHEXOL  350 MG/ML SOLN COMPARISON:  None Available. FINDINGS: Lower chest: No acute abnormality. Hepatobiliary: No focal liver abnormality is seen. No gallstones, gallbladder wall thickening, or biliary dilatation. Pancreas: Unremarkable. No pancreatic ductal dilatation or surrounding inflammatory changes. Spleen: Normal in size without focal abnormality. Adrenals/Urinary Tract: Adrenal glands are unremarkable. Kidneys are normal, without renal calculi, focal lesion, or hydronephrosis. Bladder is unremarkable. Stomach/Bowel: Stomach is  within normal limits. The appendix is surgically absent. No evidence of bowel dilatation. The ascending colon, transverse colon and entirety of the small bowel are collapsed and subsequently limited in evaluation. Loss of sulcal markings is noted throughout the remainder of the large bowel which contains fluid. Vascular/Lymphatic: No significant vascular findings are present. No enlarged abdominal or pelvic lymph nodes. Reproductive: Uterus and bilateral adnexa are unremarkable. Other: No abdominal wall hernia or abnormality. No abdominopelvic ascites. Musculoskeletal: No acute or significant osseous findings. IMPRESSION: 1. Findings suggestive of mild colitis. 2. Evidence of prior appendectomy. Electronically Signed   By: Suzen Dials M.D.   On: 02/29/2024 12:28    EKG: None  Radiology: CT ABDOMEN PELVIS W CONTRAST Result Date: 02/29/2024 CLINICAL DATA:  Abdominal pain and nausea. EXAM: CT ABDOMEN AND PELVIS WITH CONTRAST TECHNIQUE: Multidetector CT imaging of the abdomen and pelvis was performed using the standard protocol following bolus administration of intravenous contrast. RADIATION DOSE REDUCTION: This exam was performed according to the departmental dose-optimization program which includes automated exposure control, adjustment of the mA and/or kV according to patient size and/or use of iterative reconstruction technique. CONTRAST:  65mL OMNIPAQUE  IOHEXOL  350 MG/ML SOLN COMPARISON:  None Available. FINDINGS: Lower chest: No acute abnormality. Hepatobiliary: No focal liver abnormality is seen. No gallstones, gallbladder wall thickening, or biliary dilatation. Pancreas: Unremarkable. No pancreatic ductal dilatation or surrounding inflammatory changes. Spleen: Normal in size without focal abnormality. Adrenals/Urinary Tract: Adrenal glands are unremarkable. Kidneys are normal, without renal calculi, focal lesion, or hydronephrosis. Bladder is unremarkable. Stomach/Bowel: Stomach is within normal  limits. The appendix is surgically absent. No evidence of bowel dilatation. The ascending colon, transverse colon and entirety of the small bowel are collapsed and subsequently limited in evaluation. Loss of sulcal markings is noted throughout the remainder of the large bowel which contains fluid. Vascular/Lymphatic: No significant vascular findings are present.  No enlarged abdominal or pelvic lymph nodes. Reproductive: Uterus and bilateral adnexa are unremarkable. Other: No abdominal wall hernia or abnormality. No abdominopelvic ascites. Musculoskeletal: No acute or significant osseous findings. IMPRESSION: 1. Findings suggestive of mild colitis. 2. Evidence of prior appendectomy. Electronically Signed   By: Suzen Dials M.D.   On: 02/29/2024 12:28     Procedures   Medications Ordered in the ED  fentaNYL  (SUBLIMAZE ) injection 25 mcg (25 mcg Intravenous Given 02/29/24 1036)  ondansetron  (ZOFRAN ) injection 4 mg (4 mg Intravenous Given 02/29/24 1036)  iohexol  (OMNIPAQUE ) 350 MG/ML injection 65 mL (65 mLs Intravenous Contrast Given 02/29/24 1212)  lactated ringers  bolus 1,000 mL (1,000 mLs Intravenous New Bag/Given 02/29/24 1759)  morphine  (PF) 2 MG/ML injection 4 mg (4 mg Intravenous Given 02/29/24 1759)  ondansetron  (ZOFRAN ) injection 4 mg (4 mg Intravenous Given 02/29/24 1759)  piperacillin -tazobactam (ZOSYN ) IVPB 3.375 g (0 g Intravenous Stopped 02/29/24 1839)                                    Medical Decision Making Problems Addressed: Colitis: acute illness or injury with systemic symptoms that poses a threat to life or bodily functions Contusion of right knee, initial encounter: acute illness or injury Dehydration: acute illness or injury with systemic symptoms that poses a threat to life or bodily functions Fall from slip, trip, or stumble, initial encounter: acute illness or injury Nausea and vomiting in adult: acute illness or injury with systemic symptoms  Amount and/or Complexity of Data  Reviewed Independent Historian:     Details: Family/friend, hx External Data Reviewed: notes. Labs: ordered. Decision-making details documented in ED Course. Radiology: ordered and independent interpretation performed. Decision-making details documented in ED Course.  Risk Prescription drug management. Parenteral controlled substances. Decision regarding hospitalization.   Iv ns.  Labs ordered/sent. Imaging ordered.   Differential diagnosis includes gastroenteritis, dehydration, food poisoning, etc. Dispo decision including potential need for admission considered - will get labs and imaging and reassess.   Reviewed nursing notes and prior charts for additional history. External reports reviewed. Additional history from: EMS/fam/friend.   Labs reviewed/interpreted by me - wbc 18, elevated. Hgb normal. Chem normal. Lipase normal. Preg neg.   CT reviewed/interpreted by me - ?colitis. No sbo.   Morphine  iv, zofran  iv, LR bolus. Zosyn  iv. Discussed ct w pt.   Patient is asking for po fluids/provided. No recurrent nausea or vomiting. Abd is soft and non tender. Vitals are normal. Pt appears stable for ED d/c.   Rec close pcp f/u.  Return precautions provided.       Final diagnoses:  Colitis  Nausea and vomiting in adult  Dehydration  Fall from slip, trip, or stumble, initial encounter  Contusion of right knee, initial encounter    ED Discharge Orders     None          Bernard Drivers, MD 02/29/24 2001  "

## 2024-02-29 NOTE — ED Notes (Signed)
 Encouraged to stay to be seen a 2nd time.

## 2024-02-29 NOTE — Discharge Instructions (Addendum)
 It was our pleasure to provide your ER care today - we hope that you feel better.  Drink plenty of fluids/stay well hydrated.   Take augmentin  (antibiotic) as prescribed. Take acetaminophen  as need.   Follow up closely with primary care doctor in 2-3 days if symptoms fail to improve/resolve.  Return to ER if worse, new symptoms, high fevers, new or worsening or severe abdominal pain, persistent vomiting, new/severe pain, trouble breathing, or other concern.   You were given pain meds in the ER - no driving for the next 6 hours.
